# Patient Record
Sex: Female | Born: 1935
Health system: Southern US, Community
[De-identification: ages and names within clinical notes are randomized; demographics above are authoritative.]

## PROBLEM LIST (undated history)

## (undated) DIAGNOSIS — H8390 Unspecified disease of inner ear, unspecified ear: Secondary | ICD-10-CM

## (undated) DIAGNOSIS — H409 Unspecified glaucoma: Secondary | ICD-10-CM

## (undated) DIAGNOSIS — C439 Malignant melanoma of skin, unspecified: Secondary | ICD-10-CM

## (undated) DIAGNOSIS — R319 Hematuria, unspecified: Secondary | ICD-10-CM

---

## 1998-02-26 ENCOUNTER — Emergency Department (HOSPITAL_COMMUNITY): Admission: EM | Admit: 1998-02-26 | Discharge: 1998-02-26 | Payer: Self-pay | Admitting: Emergency Medicine

## 2000-07-04 ENCOUNTER — Ambulatory Visit (HOSPITAL_COMMUNITY): Admission: RE | Admit: 2000-07-04 | Discharge: 2000-07-04 | Payer: Self-pay | Admitting: Gastroenterology

## 2008-10-23 ENCOUNTER — Encounter: Admission: RE | Admit: 2008-10-23 | Discharge: 2008-10-23 | Payer: Self-pay | Admitting: Family Medicine

## 2011-02-11 NOTE — Procedures (Signed)
[facility]  Patient:    Daisy Williams, Daisy Williams                      MRN: [id number] Proc. Date: 07/04/00 Adm. Date:  [id number] Attending:  Orland Mustard CC:         Hadassah Pais. Jeannetta Nap, M.D.   Procedure Report  PROCEDURE:  Colonoscopy.  MEDICATIONS:  Fentanyl 75 mcg, Versed 8 mg IV.  SCOPE:  Pediatric video colonoscope.  INDICATIONS:  Strong family history of colon cancer.  Sister had colon cancer. Mother had metastatic cancer, primary unknown.  DESCRIPTION OF PROCEDURE:  The procedure had been explained to the patient and consent obtained.  With the patient in the left lateral decubitus, the adult Olympus pediatric video colonoscope was inserted and advanced under direct visualization.  There was extensive diverticular disease in the sigmoid colon, and it took some time to pass.  After we passed, we were able to advance to the hepatic flexure.  With the patient on the right side, we were able to advance down into the cecum.  The terminal ileum was entered for a short distance and was normal.  The scope was withdrawn, and the cecum, ascending colon, hepatic flexure, transverse colon, splenic flexure, descending, and sigmoid colon were seen well.  Marked diverticular disease in the sigmoid colon.   No polyps were seen throughout the colon.  The rectum was seen well and was free of polyps.  The scope was withdrawn.  The patient tolerated the procedure well and was maintained on low-flow oxygen and pulse oximeter throughout the procedure with no obvious problem.  ASSESSMENT: 1. No evidence of colon polyps. 2. Marked diverticular disease.  PLAN: 1. Will recommend repeating in five years due to family history. 2. Will give a handout about diverticulosis, fiber, etc. DD:  07/04/00 TD:  07/05/00 Job: [id number]

## 2011-06-29 ENCOUNTER — Emergency Department (HOSPITAL_COMMUNITY): Payer: Medicare Other

## 2011-06-29 ENCOUNTER — Emergency Department (HOSPITAL_COMMUNITY)
Admission: EM | Admit: 2011-06-29 | Discharge: 2011-06-29 | Disposition: A | Discharge status: Home / Self Care | Payer: Medicare Other | Attending: Emergency Medicine | Admitting: Emergency Medicine

## 2011-06-29 DIAGNOSIS — S0990XA Unspecified injury of head, initial encounter: Secondary | ICD-10-CM | POA: Insufficient documentation

## 2011-06-29 DIAGNOSIS — R079 Chest pain, unspecified: Secondary | ICD-10-CM | POA: Insufficient documentation

## 2011-06-29 DIAGNOSIS — R42 Dizziness and giddiness: Secondary | ICD-10-CM | POA: Insufficient documentation

## 2011-06-29 DIAGNOSIS — R110 Nausea: Secondary | ICD-10-CM | POA: Insufficient documentation

## 2011-06-29 DIAGNOSIS — W19XXXA Unspecified fall, initial encounter: Secondary | ICD-10-CM | POA: Insufficient documentation

## 2011-06-29 LAB — DIFFERENTIAL
Basophils Absolute: 0 10*3/uL (ref 0.0–0.1)
Eosinophils Absolute: 0.1 10*3/uL (ref 0.0–0.7)
Eosinophils Relative: 1 % (ref 0–5)
Neutrophils Relative %: 71 % (ref 43–77)

## 2011-06-29 LAB — POCT I-STAT TROPONIN I: Troponin i, poc: 0 ng/mL (ref 0.00–0.08)

## 2011-06-29 LAB — COMPREHENSIVE METABOLIC PANEL
ALT: 8 U/L (ref 0–35)
AST: 13 U/L (ref 0–37)
Albumin: 3.5 g/dL (ref 3.5–5.2)
Alkaline Phosphatase: 40 U/L (ref 39–117)
Chloride: 100 mEq/L (ref 96–112)
Creatinine, Ser: 0.71 mg/dL (ref 0.50–1.10)
Potassium: 3.6 mEq/L (ref 3.5–5.1)
Sodium: 137 mEq/L (ref 135–145)
Total Bilirubin: 0.3 mg/dL (ref 0.3–1.2)

## 2011-06-29 LAB — CBC
Platelets: 242 10*3/uL (ref 150–400)
RDW: 14 % (ref 11.5–15.5)
WBC: 6.3 10*3/uL (ref 4.0–10.5)

## 2012-01-30 DIAGNOSIS — H353190 Nonexudative age-related macular degeneration, unspecified eye, stage unspecified: Secondary | ICD-10-CM | POA: Diagnosis not present

## 2012-01-30 DIAGNOSIS — H25019 Cortical age-related cataract, unspecified eye: Secondary | ICD-10-CM | POA: Diagnosis not present

## 2012-01-30 DIAGNOSIS — H4011X0 Primary open-angle glaucoma, stage unspecified: Secondary | ICD-10-CM | POA: Diagnosis not present

## 2012-01-30 DIAGNOSIS — H409 Unspecified glaucoma: Secondary | ICD-10-CM | POA: Diagnosis not present

## 2012-04-25 DIAGNOSIS — H409 Unspecified glaucoma: Secondary | ICD-10-CM | POA: Diagnosis not present

## 2012-04-25 DIAGNOSIS — H4011X0 Primary open-angle glaucoma, stage unspecified: Secondary | ICD-10-CM | POA: Diagnosis not present

## 2012-06-26 DIAGNOSIS — Z23 Encounter for immunization: Secondary | ICD-10-CM | POA: Diagnosis not present

## 2012-09-25 DIAGNOSIS — H409 Unspecified glaucoma: Secondary | ICD-10-CM | POA: Diagnosis not present

## 2012-09-25 DIAGNOSIS — H4011X0 Primary open-angle glaucoma, stage unspecified: Secondary | ICD-10-CM | POA: Diagnosis not present

## 2012-11-28 DIAGNOSIS — H409 Unspecified glaucoma: Secondary | ICD-10-CM | POA: Diagnosis not present

## 2012-11-28 DIAGNOSIS — H4011X0 Primary open-angle glaucoma, stage unspecified: Secondary | ICD-10-CM | POA: Diagnosis not present

## 2013-01-16 DIAGNOSIS — M25579 Pain in unspecified ankle and joints of unspecified foot: Secondary | ICD-10-CM | POA: Diagnosis not present

## 2013-01-16 DIAGNOSIS — L989 Disorder of the skin and subcutaneous tissue, unspecified: Secondary | ICD-10-CM | POA: Diagnosis not present

## 2013-01-16 DIAGNOSIS — Z85828 Personal history of other malignant neoplasm of skin: Secondary | ICD-10-CM | POA: Diagnosis not present

## 2013-01-16 DIAGNOSIS — D485 Neoplasm of uncertain behavior of skin: Secondary | ICD-10-CM | POA: Diagnosis not present

## 2013-01-16 DIAGNOSIS — E785 Hyperlipidemia, unspecified: Secondary | ICD-10-CM | POA: Diagnosis not present

## 2013-01-16 DIAGNOSIS — Z79899 Other long term (current) drug therapy: Secondary | ICD-10-CM | POA: Diagnosis not present

## 2013-01-16 DIAGNOSIS — L570 Actinic keratosis: Secondary | ICD-10-CM | POA: Diagnosis not present

## 2013-02-08 DIAGNOSIS — L570 Actinic keratosis: Secondary | ICD-10-CM | POA: Diagnosis not present

## 2013-06-06 DIAGNOSIS — H4011X0 Primary open-angle glaucoma, stage unspecified: Secondary | ICD-10-CM | POA: Diagnosis not present

## 2013-06-06 DIAGNOSIS — H409 Unspecified glaucoma: Secondary | ICD-10-CM | POA: Diagnosis not present

## 2013-06-26 DIAGNOSIS — H409 Unspecified glaucoma: Secondary | ICD-10-CM | POA: Diagnosis not present

## 2013-06-26 DIAGNOSIS — H4011X0 Primary open-angle glaucoma, stage unspecified: Secondary | ICD-10-CM | POA: Diagnosis not present

## 2013-06-27 DIAGNOSIS — Z23 Encounter for immunization: Secondary | ICD-10-CM | POA: Diagnosis not present

## 2013-08-13 DIAGNOSIS — H409 Unspecified glaucoma: Secondary | ICD-10-CM | POA: Diagnosis not present

## 2013-08-13 DIAGNOSIS — H4011X0 Primary open-angle glaucoma, stage unspecified: Secondary | ICD-10-CM | POA: Diagnosis not present

## 2013-11-18 DIAGNOSIS — H4011X0 Primary open-angle glaucoma, stage unspecified: Secondary | ICD-10-CM | POA: Diagnosis not present

## 2013-11-18 DIAGNOSIS — H2510 Age-related nuclear cataract, unspecified eye: Secondary | ICD-10-CM | POA: Diagnosis not present

## 2013-11-18 DIAGNOSIS — H409 Unspecified glaucoma: Secondary | ICD-10-CM | POA: Diagnosis not present

## 2014-01-13 DIAGNOSIS — H409 Unspecified glaucoma: Secondary | ICD-10-CM | POA: Diagnosis not present

## 2014-01-13 DIAGNOSIS — H4011X0 Primary open-angle glaucoma, stage unspecified: Secondary | ICD-10-CM | POA: Diagnosis not present

## 2014-01-13 DIAGNOSIS — H25019 Cortical age-related cataract, unspecified eye: Secondary | ICD-10-CM | POA: Diagnosis not present

## 2014-04-14 DIAGNOSIS — H4011X0 Primary open-angle glaucoma, stage unspecified: Secondary | ICD-10-CM | POA: Diagnosis not present

## 2014-04-14 DIAGNOSIS — H25019 Cortical age-related cataract, unspecified eye: Secondary | ICD-10-CM | POA: Diagnosis not present

## 2014-04-14 DIAGNOSIS — H409 Unspecified glaucoma: Secondary | ICD-10-CM | POA: Diagnosis not present

## 2014-06-25 DIAGNOSIS — Z23 Encounter for immunization: Secondary | ICD-10-CM | POA: Diagnosis not present

## 2014-07-28 DIAGNOSIS — H4011X2 Primary open-angle glaucoma, moderate stage: Secondary | ICD-10-CM | POA: Diagnosis not present

## 2014-07-28 DIAGNOSIS — H25013 Cortical age-related cataract, bilateral: Secondary | ICD-10-CM | POA: Diagnosis not present

## 2014-10-06 DIAGNOSIS — H25013 Cortical age-related cataract, bilateral: Secondary | ICD-10-CM | POA: Diagnosis not present

## 2014-10-06 DIAGNOSIS — H4011X2 Primary open-angle glaucoma, moderate stage: Secondary | ICD-10-CM | POA: Diagnosis not present

## 2015-02-06 ENCOUNTER — Emergency Department (HOSPITAL_COMMUNITY): Payer: Medicare Other

## 2015-02-06 ENCOUNTER — Inpatient Hospital Stay (HOSPITAL_COMMUNITY): Payer: Medicare Other

## 2015-02-06 ENCOUNTER — Encounter (HOSPITAL_COMMUNITY): Payer: Self-pay | Admitting: Physical Medicine and Rehabilitation

## 2015-02-06 ENCOUNTER — Inpatient Hospital Stay (HOSPITAL_COMMUNITY)
Admission: EM | Admit: 2015-02-06 | Discharge: 2015-02-08 | DRG: 310 | Disposition: A | Discharge status: Home / Self Care | Payer: Medicare Other | Attending: Internal Medicine | Admitting: Internal Medicine

## 2015-02-06 DIAGNOSIS — J439 Emphysema, unspecified: Secondary | ICD-10-CM | POA: Diagnosis not present

## 2015-02-06 DIAGNOSIS — H409 Unspecified glaucoma: Secondary | ICD-10-CM | POA: Diagnosis present

## 2015-02-06 DIAGNOSIS — I358 Other nonrheumatic aortic valve disorders: Secondary | ICD-10-CM | POA: Diagnosis present

## 2015-02-06 DIAGNOSIS — R1110 Vomiting, unspecified: Secondary | ICD-10-CM

## 2015-02-06 DIAGNOSIS — R42 Dizziness and giddiness: Secondary | ICD-10-CM | POA: Diagnosis not present

## 2015-02-06 DIAGNOSIS — R9431 Abnormal electrocardiogram [ECG] [EKG]: Secondary | ICD-10-CM | POA: Diagnosis present

## 2015-02-06 DIAGNOSIS — R030 Elevated blood-pressure reading, without diagnosis of hypertension: Secondary | ICD-10-CM

## 2015-02-06 DIAGNOSIS — I10 Essential (primary) hypertension: Secondary | ICD-10-CM | POA: Diagnosis present

## 2015-02-06 DIAGNOSIS — IMO0001 Reserved for inherently not codable concepts without codable children: Secondary | ICD-10-CM

## 2015-02-06 DIAGNOSIS — R112 Nausea with vomiting, unspecified: Secondary | ICD-10-CM

## 2015-02-06 DIAGNOSIS — E860 Dehydration: Secondary | ICD-10-CM | POA: Diagnosis present

## 2015-02-06 DIAGNOSIS — I4510 Unspecified right bundle-branch block: Secondary | ICD-10-CM | POA: Diagnosis present

## 2015-02-06 DIAGNOSIS — I4891 Unspecified atrial fibrillation: Secondary | ICD-10-CM | POA: Diagnosis present

## 2015-02-06 DIAGNOSIS — E1165 Type 2 diabetes mellitus with hyperglycemia: Secondary | ICD-10-CM | POA: Diagnosis not present

## 2015-02-06 DIAGNOSIS — R7309 Other abnormal glucose: Secondary | ICD-10-CM | POA: Diagnosis not present

## 2015-02-06 DIAGNOSIS — R55 Syncope and collapse: Secondary | ICD-10-CM | POA: Diagnosis not present

## 2015-02-06 DIAGNOSIS — D7589 Other specified diseases of blood and blood-forming organs: Secondary | ICD-10-CM | POA: Diagnosis present

## 2015-02-06 DIAGNOSIS — R739 Hyperglycemia, unspecified: Secondary | ICD-10-CM | POA: Diagnosis present

## 2015-02-06 DIAGNOSIS — R312 Other microscopic hematuria: Secondary | ICD-10-CM | POA: Diagnosis present

## 2015-02-06 DIAGNOSIS — R3129 Other microscopic hematuria: Secondary | ICD-10-CM

## 2015-02-06 HISTORY — DX: Hematuria, unspecified: R31.9

## 2015-02-06 HISTORY — DX: Unspecified disease of inner ear, unspecified ear: H83.90

## 2015-02-06 HISTORY — DX: Malignant melanoma of skin, unspecified: C43.9

## 2015-02-06 HISTORY — DX: Unspecified glaucoma: H40.9

## 2015-02-06 LAB — IRON AND TIBC
Iron: 63 ug/dL (ref 28–170)
SATURATION RATIOS: 22 % (ref 10.4–31.8)
TIBC: 283 ug/dL (ref 250–450)
UIBC: 220 ug/dL

## 2015-02-06 LAB — URINALYSIS, ROUTINE W REFLEX MICROSCOPIC
Bilirubin Urine: NEGATIVE
GLUCOSE, UA: NEGATIVE mg/dL
KETONES UR: NEGATIVE mg/dL
LEUKOCYTES UA: NEGATIVE
Nitrite: NEGATIVE
PH: 7.5 (ref 5.0–8.0)
Protein, ur: NEGATIVE mg/dL
Specific Gravity, Urine: 1.012 (ref 1.005–1.030)
Urobilinogen, UA: 0.2 mg/dL (ref 0.0–1.0)

## 2015-02-06 LAB — I-STAT CHEM 8, ED
BUN: 12 mg/dL (ref 6–20)
CALCIUM ION: 1.22 mmol/L (ref 1.13–1.30)
CREATININE: 0.7 mg/dL (ref 0.44–1.00)
Chloride: 105 mmol/L (ref 101–111)
GLUCOSE: 167 mg/dL — AB (ref 65–99)
HEMATOCRIT: 44 % (ref 36.0–46.0)
Hemoglobin: 15 g/dL (ref 12.0–15.0)
Potassium: 5.1 mmol/L (ref 3.5–5.1)
SODIUM: 141 mmol/L (ref 135–145)
TCO2: 22 mmol/L (ref 0–100)

## 2015-02-06 LAB — RAPID URINE DRUG SCREEN, HOSP PERFORMED
Amphetamines: NOT DETECTED
BARBITURATES: NOT DETECTED
BENZODIAZEPINES: NOT DETECTED
Cocaine: NOT DETECTED
OPIATES: NOT DETECTED
Tetrahydrocannabinol: NOT DETECTED

## 2015-02-06 LAB — CBC
HCT: 40.5 % (ref 36.0–46.0)
HEMOGLOBIN: 14.1 g/dL (ref 12.0–15.0)
MCH: 36.8 pg — AB (ref 26.0–34.0)
MCHC: 34.8 g/dL (ref 30.0–36.0)
MCV: 105.7 fL — AB (ref 78.0–100.0)
Platelets: 239 10*3/uL (ref 150–400)
RBC: 3.83 MIL/uL — ABNORMAL LOW (ref 3.87–5.11)
RDW: 13.8 % (ref 11.5–15.5)
WBC: 10 10*3/uL (ref 4.0–10.5)

## 2015-02-06 LAB — COMPREHENSIVE METABOLIC PANEL
ALBUMIN: 3.9 g/dL (ref 3.5–5.0)
ALT: 19 U/L (ref 14–54)
AST: 35 U/L (ref 15–41)
Alkaline Phosphatase: 49 U/L (ref 38–126)
Anion gap: 11 (ref 5–15)
BUN: 10 mg/dL (ref 6–20)
CO2: 22 mmol/L (ref 22–32)
Calcium: 9.4 mg/dL (ref 8.9–10.3)
Chloride: 108 mmol/L (ref 101–111)
Creatinine, Ser: 0.73 mg/dL (ref 0.44–1.00)
GFR calc non Af Amer: >60 mL/min (ref >60)
GLUCOSE: 161 mg/dL — AB (ref 65–99)
Potassium: 5 mmol/L (ref 3.5–5.1)
SODIUM: 141 mmol/L (ref 135–145)
TOTAL PROTEIN: 7.1 g/dL (ref 6.5–8.1)
Total Bilirubin: 1.4 mg/dL — ABNORMAL HIGH (ref 0.3–1.2)

## 2015-02-06 LAB — DIFFERENTIAL
BASOS ABS: 0 10*3/uL (ref 0.0–0.1)
Basophils Relative: 0 % (ref 0–1)
Eosinophils Absolute: 0 10*3/uL (ref 0.0–0.7)
Eosinophils Relative: 0 % (ref 0–5)
LYMPHS PCT: 10 % — AB (ref 12–46)
Lymphs Abs: 1 10*3/uL (ref 0.7–4.0)
Monocytes Absolute: 0.4 10*3/uL (ref 0.1–1.0)
Monocytes Relative: 4 % (ref 3–12)
NEUTROS ABS: 8.5 10*3/uL — AB (ref 1.7–7.7)
NEUTROS PCT: 86 % — AB (ref 43–77)

## 2015-02-06 LAB — URINE MICROSCOPIC-ADD ON

## 2015-02-06 LAB — PROTIME-INR
INR: 1.05 (ref 0.00–1.49)
PROTHROMBIN TIME: 13.8 s (ref 11.6–15.2)

## 2015-02-06 LAB — I-STAT TROPONIN, ED: Troponin i, poc: 0 ng/mL (ref 0.00–0.08)

## 2015-02-06 LAB — FERRITIN: Ferritin: 68 ng/mL (ref 11–307)

## 2015-02-06 LAB — TSH: TSH: 2.804 u[IU]/mL (ref 0.350–4.500)

## 2015-02-06 LAB — RETICULOCYTES
RBC.: 3.73 MIL/uL — ABNORMAL LOW (ref 3.87–5.11)
Retic Count, Absolute: 41 10*3/uL (ref 19.0–186.0)
Retic Ct Pct: 1.1 % (ref 0.4–3.1)

## 2015-02-06 LAB — APTT: APTT: 28 s (ref 24–37)

## 2015-02-06 LAB — VITAMIN B12: Vitamin B-12: 2257 pg/mL — ABNORMAL HIGH (ref 180–914)

## 2015-02-06 LAB — TROPONIN I: Troponin I: <0.03 ng/mL (ref <0.031)

## 2015-02-06 LAB — ETHANOL

## 2015-02-06 LAB — FOLATE: Folate: 28 ng/mL (ref >5.9)

## 2015-02-06 MED ORDER — SODIUM CHLORIDE 0.9 % IV BOLUS (SEPSIS)
1000.0000 mL | INTRAVENOUS | Status: AC
  Administered 2015-02-06: 1000 mL via INTRAVENOUS

## 2015-02-06 MED ORDER — ASPIRIN 325 MG PO TABS
325.0000 mg | ORAL_TABLET | Freq: Every day | ORAL | Status: DC
  Administered 2015-02-06 – 2015-02-07 (×2): 325 mg via ORAL
  Filled 2015-02-06 (×2): qty 1

## 2015-02-06 MED ORDER — DIAZEPAM 2 MG PO TABS: 2.0000 mg | ORAL_TABLET | Freq: Four times a day (QID) | ORAL | Status: DC | PRN

## 2015-02-06 MED ORDER — LATANOPROST 0.005 % OP SOLN
1.0000 [drp] | Freq: Every day | OPHTHALMIC | Status: DC
  Administered 2015-02-06 – 2015-02-07 (×2): 1 [drp] via OPHTHALMIC
  Filled 2015-02-06: qty 2.5

## 2015-02-06 MED ORDER — ONDANSETRON HCL 4 MG/2ML IJ SOLN
4.0000 mg | Freq: Once | INTRAMUSCULAR | Status: AC
  Administered 2015-02-06: 4 mg via INTRAVENOUS
  Filled 2015-02-06: qty 2

## 2015-02-06 MED ORDER — APIXABAN 5 MG PO TABS
5.0000 mg | ORAL_TABLET | Freq: Two times a day (BID) | ORAL | Status: DC
  Administered 2015-02-06 – 2015-02-08 (×4): 5 mg via ORAL
  Filled 2015-02-06 (×4): qty 1

## 2015-02-06 MED ORDER — LORAZEPAM 2 MG/ML IJ SOLN
0.5000 mg | Freq: Once | INTRAMUSCULAR | Status: AC
  Administered 2015-02-06: 0.5 mg via INTRAVENOUS
  Filled 2015-02-06: qty 1

## 2015-02-06 MED ORDER — DORZOLAMIDE HCL-TIMOLOL MAL 2-0.5 % OP SOLN
1.0000 [drp] | Freq: Two times a day (BID) | OPHTHALMIC | Status: DC
  Administered 2015-02-06 – 2015-02-08 (×4): 1 [drp] via OPHTHALMIC
  Filled 2015-02-06: qty 10

## 2015-02-06 MED ORDER — SODIUM CHLORIDE 0.9 % IV SOLN
INTRAVENOUS | Status: DC
  Administered 2015-02-06 (×2): via INTRAVENOUS

## 2015-02-06 MED ORDER — STROKE: EARLY STAGES OF RECOVERY BOOK
Freq: Once | Status: AC
  Administered 2015-02-06: 20:00:00

## 2015-02-06 MED ORDER — SODIUM CHLORIDE 0.9 % IV BOLUS (SEPSIS): 250.0000 mL | Freq: Once | INTRAVENOUS | Status: DC

## 2015-02-06 MED ORDER — MECLIZINE HCL 12.5 MG PO TABS
12.5000 mg | ORAL_TABLET | Freq: Three times a day (TID) | ORAL | Status: DC
  Administered 2015-02-06: 12.5 mg via ORAL
  Filled 2015-02-06: qty 1

## 2015-02-06 MED ORDER — ACETAMINOPHEN 325 MG PO TABS: 650.0000 mg | ORAL_TABLET | ORAL | Status: DC | PRN

## 2015-02-06 MED ORDER — HEPARIN SODIUM (PORCINE) 5000 UNIT/ML IJ SOLN: 5000.0000 [IU] | Freq: Three times a day (TID) | INTRAMUSCULAR | Status: DC

## 2015-02-06 NOTE — H&P (Signed)
[facility] History and Physical                                                                                    Jasmyne Lodato, is a 81 y.o. female  MRN: [id number]   DOB - [birth year]  Admit Date - 02/06/2015  Outpatient Primary MD for the patient is No primary care provider on file.  Referring MD: Dr Aline Brochure / ER  With History of -  Glaucoma Vitamin D deficiency B12 deficiency  Resection melanoma under breast (patient in her 5s)  in for   Chief Complaint  Patient presents with  . Dizziness  . Emesis     HPI This is a 81 year old independent female patient with only past medical history of glaucoma who presented to the ER today after abrupt onset of postural dizziness with associated ataxia and nausea and vomiting. Patient reports that she was normal when she went to bed and upon awakening this morning was very dizzy. She attempted to get out of bed but felt so unsteady she was afraid she would fall so she went to the floor and crawled to the bathroom. She was able to get up on the toilet seat at which point she began sweating profusely. She also developed significant nausea and called out to her husband. She later began to have multiple episodes of emesis. She was not having any vertigo symptoms during this time and did not report any focal neurological deficits when questioned. The dizziness has persisted since that time. She also reports she had a headache and was concerned this may be a sign of a heart attack. She did not have any jaw or chest pain. She told my attending physician that she would not allow her husband to take her to the hospital so she basically laid on the floor of her home for at least 4 hours. Prior to leaving she did ask her husband to bring her glass of water because she was thirsty but then felt so nauseous she was unable to drink the water. In discussing past medical history she denies any significant medical problems other than those stated above.  She denies any history of irregular heartbeats or awareness of palpitations during above episode. She is normally very active and states typically she her husband go out to eat at [facility] and then go to the [place] and walk around.  Upon arrival to the ER patient was afebrile, heart rate was 94 and irregular and she was noted to be in atrial fibrillation on bedside telemetry, BP was 179/91, she was maintaining room air saturations of 97%. Because of her postural symptoms she was given a liter of IV fluid in the ER and is also being given Zofran and Ativan to help in her symptoms. Laboratory data was unremarkable except for borderline elevated potassium at 5.0. Glucose slightly elevated at 161 and total bilirubin slightly elevated at 1.4. Initial troponin 0.00. Hemoglobin 14.1 with baseline unknown. She was macrocyte ptotic with an MCV of 105.7. Her white count was normal but she did have elevated neutrophil count of 86%. Her legs were normal. Alcohol level was less than 5. CT of the head  showed no acute changes. EKG revealed atrial fibrillation with controlled ventricular response, PVCs, intraventricular conduction delay, and a prolonged QTC of 541 ms.   Review of Systems   In addition to the HPI above,  No Fever-chills, myalgias or other constitutional symptoms No changes with Vision or hearing, new weakness, tingling, numbness in any extremity, No problems swallowing food or Liquids, indigestion/reflux No Chest pain, Cough or Shortness of Breath, palpitations, orthopnea or DOE No Abdominal pain, no melena or hematochezia, no dark tarry stools, Bowel movements are regular, No dysuria, hematuria or flank pain No new skin rashes, lesions, masses or bruises, No new joints pains-aches No recent weight gain or loss No polyuria, polydypsia or polyphagia,  *A full 10 point Review of Systems was done, except as stated above, all other Review of Systems were negative.  Social History History  Substance  Use Topics  . Smoking status: Never Smoker   . Smokeless tobacco: Not on file  . Alcohol Use: No    Resides at: Private residence  Lives with: Husband  Ambulatory status: Was ambulatory without assistive devices prior to current illness   Family History Family History  Problem Relation Age of Onset  . Colon cancer MI/? Sudden cardiac death   Father, sister, and brother all in their 17s and all nonsmokers      Prior to Admission medications   Not on File    No Known Allergies  Physical Exam  Vitals  Blood pressure 142/68, pulse 81, temperature 97.8 F (36.6 C), temperature source Oral, resp. rate 18, SpO2 98 %.   General: Minimal distress as evidenced by persistent dizziness with nausea especially when seated upright on stretcher, appears healthy and well nourished  Psych:  Normal affect, Denies Suicidal or Homicidal ideations, Awake Alert, Oriented X 3. Speech and thought patterns are clear and appropriate, no apparent short term memory deficits  Neuro:   No focal neurological deficits, CN II through XII intact, Strength 5/5 all 4 extremities, Sensation intact all 4 extremities. No nystagmus appreciated-dizziness and nausea reproducible with sitting patient upright in the bed.  ENT:  Ears and Eyes appear Normal, Conjunctivae clear, PER. Moist oral mucosa without erythema or exudates.  Neck:  Supple, No lymphadenopathy appreciated  Respiratory:  Symmetrical chest wall movement, Good air movement bilaterally, CTAB. Room Air  Cardiac:  Irregular with atrial fibrillation noted on bedside telemetry, No Murmurs, no LE edema noted, no JVD, No carotid bruits, peripheral pulses palpable at 2+  Abdomen:  Positive bowel sounds, Soft, Non tender, Non distended,  No masses appreciated, no obvious hepatosplenomegaly  Skin:  No Cyanosis, Normal Skin Turgor, No Skin Rash or Bruise.  Extremities: Symmetrical without obvious trauma or injury,  no effusions.  Data  Review  CBC  Recent Labs Lab 02/06/15 1155 02/06/15 1231  WBC 10.0  --   HGB 14.1 15.0  HCT 40.5 44.0  PLT 239  --   MCV 105.7*  --   MCH 36.8*  --   MCHC 34.8  --   RDW 13.8  --   LYMPHSABS 1.0  --   MONOABS 0.4  --   EOSABS 0.0  --   BASOSABS 0.0  --     Chemistries   Recent Labs Lab 02/06/15 1155 02/06/15 1231  NA 141 141  K 5.0 5.1  CL 108 105  CO2 22  --   GLUCOSE 161* 167*  BUN 10 12  CREATININE 0.73 0.70  CALCIUM 9.4  --   AST 35  --  ALT 19  --   ALKPHOS 49  --   BILITOT 1.4*  --     CrCl cannot be calculated (Unknown ideal weight.).  No results for input(s): TSH, T4TOTAL, T3FREE, THYROIDAB in the last 72 hours.  Invalid input(s): FREET3  Coagulation profile  Recent Labs Lab 02/06/15 1258  INR 1.05    No results for input(s): DDIMER in the last 72 hours.  Cardiac Enzymes No results for input(s): CKMB, TROPONINI, MYOGLOBIN in the last 168 hours.  Invalid input(s): CK  Invalid input(s): POCBNP  Urinalysis No results found for: COLORURINE, APPEARANCEUR, LABSPEC, PHURINE, GLUCOSEU, HGBUR, BILIRUBINUR, KETONESUR, PROTEINUR, UROBILINOGEN, NITRITE, LEUKOCYTESUR  Imaging results:   Ct Head Wo Contrast  02/06/2015   CLINICAL DATA:  Extreme dizziness and nausea. Vomiting since 6:30 a.m. today. Syncopal episode today.  EXAM: CT HEAD WITHOUT CONTRAST  TECHNIQUE: Contiguous axial images were obtained from the base of the skull through the vertex without intravenous contrast.  COMPARISON:  CT head without contrast 06/29/2011  FINDINGS: No acute cortical infarct, hemorrhage, or mass lesion is present. The ventricles are of normal size. No significant extra-axial fluid collection is evident. The paranasal sinuses and mastoid air cells are clear. The calvarium is intact.  IMPRESSION: Negative CT of the head.   Electronically Signed   By: San Morelle M.D.   On: 02/06/2015 13:30     EKG: (Independently reviewed) atrial fibrillation,  intraventricular conduction delay, PVCs,? Right bundle branch block, no acute ischemic changes appreciated, prolonged QTC 541 ms   Assessment & Plan  Principal Problem:   Postural dizziness -Admit to neuro telemetry -Given new finding of atrial fibrillation concerned that symptoms may be related to atypical presentation of TIA or other embolic cerebral event therefore will check MRI/MRA of the brain -No carotid bruits and no focal neurological symptoms so no indication to check carotid duplex at this juncture -No vertigo symptoms but will begin Antivert and when necessary Valium for symptom management -PT and OT evaluation; also PT vestibular evaluation -Check orthostatic vital signs -Begin aspirin -Check fasting lipid panel  Active Problems:   Atrial fibrillation with controlled ventricular response -No prior diagnosis -Cardiology has been consulted/Dr Hilty to see patient -CHADVASc = 3 so likely will need to be on anticoagulation-defer to cardiology -Etiology includes cardiac structural issues versus related to advanced age versus possible ischemic etiology -Cycle cardiac enzymes -Patient reports that her father sister and brother all died suddenly in their 59s-they were nonsmokers and cause of death was presumed to be myocardial infarction -Check 2-D echocardiogram    Prolonged QT interval -Patient not on medications prior to admission that would contribute to prolongation of QTC -This may also explain daily members sudden death issues -Defer additional workup to cardiology -Avoid QTC prolonging medications such as antipsychotics or floroquinolones    Dehydration -Seems acute and related to repeated vomiting prior to arrival -Has been given 1 L of IV fluids in the ER -Continue IV fluids at 75 mL per hour for now -Follow up labs    Acute hyperglycemia -Uncertain if related to acute stress i.e. excessive cortisol Vernell Barrier versus underlying diabetes -Check hemoglobin A1c     Macrocytosis -Patient is not anemic -Is on oral B12 at home -Check anemia panel and TSH    DVT Prophylaxis: Subcutaneous heparin  Family Communication: Husband at bedside  Code Status:  Full code  Condition:  Stable  Discharge disposition:  Time spent in minutes : 60      ELLIS,ALLISON L. ANP on  02/06/2015 at 3:33 PM  Between 7am to 7pm - Pager - [phone]  After 7pm go to [email or web address] - password [email or web address]  And look for the night coverage person covering me after hours  [facility]

## 2015-02-06 NOTE — ED Notes (Signed)
Report attempted 4N

## 2015-02-06 NOTE — ED Notes (Signed)
Pt presents to department for evaluation of dizziness and nausea/vomiting. Onset this morning. Reports near syncopal episode at home. Denies pain upon arrival to ED. Pt is alert and oriented x4. Actively vomiting upon arrival to exam room.

## 2015-02-06 NOTE — Progress Notes (Signed)
No acute stroke or bleed noted on head MRI or CT. I'm recommending starting anticoagulation tonight, based on an elevated CHADSVASC score of at least 3. Recommend Eliquis 5 mg BID - weight >60 kg, normal renal function, age <72.   Pixie Casino, MD, [facility] Attending Cardiologist [place]

## 2015-02-06 NOTE — Consult Note (Signed)
Cardiology Consultation Note  Patient ID: Daisy Williams, MRN: [id number], DOB/AGE: [birth year] 81 y.o. Admit date: 02/06/2015   Date of Consult: 02/06/2015 Primary Physician: No primary care provider on file. Primary Cardiologist: New to Dr. Debara Pickett  Chief Complaint: nausea, vomiting, lightheadedness Reason for Consultation: atrial fibrillation, newly recognized of unclear duration  HPI: Daisy Williams is an 81 y/o F with history of glaucoma, remote inner ear dysfunction, remote melanoma removal, and hematuria who presented to [facility] today with nausea, vomiting, dizziness and weakness. She does not regularly follow with medical care and states she only goes to the doctor when something is wrong. She has remained active and walks at [facility] regularly without any functional limitation including no chest pain or dyspnea. She likes the sports section because there's less people around. She has been feeling well lately except until today. She woke up this morning feeling quite dizzy. It did not feel like the room was spinning. She felt so weak that she had to crawl to the bathroom because she was afraid she would fall. She got to the toilet and sat down but when she went to get up she had severe nausea and vomiting. She felt drenched in sweat. She also reported headache. She denied any jaw pain, chest pain, dyspnea, or awareness of palpitations. No sick contacts, family members with similar symptoms, fevers, chills, LEE, orthopnea, dysuria, visual changes, recent bleeding or melena. She does not have a history of falls. She wasn't even able to keep water down today. She came to the ER because of persistent symptoms and was actively vomiting on arrival. She was given 0.5mg  IV ativan and 4mg  of Zofran. She was also given 1L IV fluid bolus which she feels helped the most. Her husband notes she is more "like herself" since getting the IV fluids. She currently denies any discomfort or dyspnea but does report  continued mild dizziness. She feels more nauseated when she sits up.   In the ER she was incidentally noted to be in atrial fibrillation but generally rate controlled - 80s-low 100s. Unknown chronicity. Last EKG NSR in 2012. Blood pressure [id number] systolic. Pulse ox normal. Labs notable for glucose 167, total bili 1.4, troponin neg x 1, WBC normal but elevated neutrophils, macrocytosis 105.7 but Hgb 14.1. UA with hyaline casts and moderate Hgb - patient reports history of low grade hematuria in the past treated with cranberry juice. CT head negative.  Past Medical History  Diagnosis Date  . Glaucoma   . Inner ear dysfunction       Most Recent Cardiac Studies: None   Surgical History: History reviewed. No pertinent past surgical history.   Home Meds: Prior to Admission medications   Medication Sig Start Date End Date Taking? Authorizing Provider  CALCIUM-VITAMIN D PO Take 2 tablets by mouth daily.   Yes Historical Provider, MD  Cyanocobalamin (VITAMIN B 12 PO) Take 1 tablet by mouth daily.   Yes Historical Provider, MD  dorzolamide-timolol (COSOPT) 22.3-6.8 MG/ML ophthalmic solution Place 1 drop into both eyes 2 (two) times daily. 07/28/14 07/28/15 Yes Historical Provider, MD  latanoprost (XALATAN) 0.005 % ophthalmic solution Place 1 drop into both eyes at bedtime. 01/01/15  Yes Historical Provider, MD    Inpatient Medications:    . sodium chloride      Allergies: No Known Allergies  History   Social History  . Marital Status: Married    Spouse Name: N/A  . Number of Children: N/A  . Years of Education:  N/A   Occupational History  . Not on file.   Social History Main Topics  . Smoking status: Never Smoker   . Smokeless tobacco: Not on file  . Alcohol Use: No  . Drug Use: No  . Sexual Activity: Not on file   Other Topics Concern  . Not on file   Social History Narrative  . No narrative on file     Family History  Problem Relation Age of Onset  . Colon cancer     . Heart disease      Father passed away at 53. Was taking heart medicine - "didn't last a month after they put him on it."  . Heart disease      Brother was on heart medicine. He went off his heart medicine and died shortly after - age 69.  . Other      Sister was a nursing home patient and passed away at 32 of unknown cause.     Review of Systems: General: negative for weight changes Cardiovascular: negative for chest pain, edema, orthopnea, palpitations, paroxysmal nocturnal dyspnea, shortness of breath or dyspnea on exertion Dermatological: negative for rash Respiratory: negative for cough or wheezing Abdominal: negative for diarrhea, bright red blood per rectum, melena, or hematemesis Neurologic: negative for visual changes, syncope All other systems reviewed and are otherwise negative except as noted above.  Labs:  Lab Results  Component Value Date   WBC 10.0 02/06/2015   HGB 15.0 02/06/2015   HCT 44.0 02/06/2015   MCV 105.7* 02/06/2015   PLT 239 02/06/2015    Recent Labs Lab 02/06/15 1155 02/06/15 1231  NA 141 141  K 5.0 5.1  CL 108 105  CO2 22  --   BUN 10 12  CREATININE 0.73 0.70  CALCIUM 9.4  --   PROT 7.1  --   BILITOT 1.4*  --   ALKPHOS 49  --   ALT 19  --   AST 35  --   GLUCOSE 161* 167*   Radiology/Studies:  Ct Head Wo Contrast  02/06/2015   CLINICAL DATA:  Extreme dizziness and nausea. Vomiting since 6:30 a.m. today. Syncopal episode today.  EXAM: CT HEAD WITHOUT CONTRAST  TECHNIQUE: Contiguous axial images were obtained from the base of the skull through the vertex without intravenous contrast.  COMPARISON:  CT head without contrast 06/29/2011  FINDINGS: No acute cortical infarct, hemorrhage, or mass lesion is present. The ventricles are of normal size. No significant extra-axial fluid collection is evident. The paranasal sinuses and mastoid air cells are clear. The calvarium is intact.  IMPRESSION: Negative CT of the head.   Electronically Signed   By:  San Morelle M.D.   On: 02/06/2015 13:30   Wt Readings from Last 3 Encounters:  No data found for Wt   EKG:  1) newly recognized atrial fib 99bpm, atypical RBBB, minimal ST depression III, avF, V3-V5 2) newly recognized atrial fib 103bpm, atypical RBBB, nonspecific ST-T changes   Physical Exam: Blood pressure 142/68, pulse 81, temperature 97.8 F (36.6 C), temperature source Oral, resp. rate 18, SpO2 98 %. General: Well developed, well nourished, in no acute distress. Head: Normocephalic, atraumatic, sclera non-icteric, no xanthomas, nares are without discharge.  Neck: Negative for carotid bruits. JVD not elevated. Lungs: Clear bilaterally to auscultation without wheezes, rales, or rhonchi. Breathing is unlabored. Heart: Irregularly irregular with S1 S2. No murmurs, rubs, or gallops appreciated. Abdomen: Soft, non-tender, non-distended with normoactive bowel sounds. No hepatomegaly. No rebound/guarding. No  obvious abdominal masses. Negative Murphy's sign. Msk:  Strength and tone appear normal for age. Extremities: No clubbing or cyanosis. No edema.  Distal pedal pulses are 2+ and equal bilaterally. Neuro: Alert and oriented X 3. No facial asymmetry. No focal deficit. Moves all extremities spontaneously. Psych:  Responds to questions appropriately with a normal affect.    Assessment and Plan:   1. Nausea, vomiting and dizziness - Agree with MRI to exclude posterior circulation CVA or cerebellar insult. She does note increased dizziness upon sitting so this could be a postural inner ear issue as well. Her symptoms improved with IV fluids. Bili is up slightly. Further evaluation per IM. It does not seem like we can blame this all on atrial fib as her rate is controlled suggesting possibly longer chronicity, and she is currently rate controlled with stable - if not elevated - blood pressures. As she is not describing any typical anginal symptoms, will hold off troponins and follow-up  echocardiogram.  2. Newly recognized atrial fib of uncertain duration- given that she is not in RVR upon arrival, this rhythm may have been going on longer than today. She does not regularly seek medical care unless she feels bad. CHADSVASC is tentatively definitely 3 for age and female, however, depending on what BP/A1C result it may be 5 for HTN and DM as well. Anticipate placing her on anticoagulation once more is known about her neurologic situation - MRI pending. She will need further evaluation of her microscopic hematuria although this sounds chronic. Check echocardiogram and thyroid function.   3. Elevated blood pressure/probable HTN - following for now. May be related to acute nausea/vomiting but until more is known about the etiology, would not aggressively treat.  4. Hyperglycemia - may represent newly diagnosed DM. A1C pending.   5. Macrocytosis - IM plans to pursue anemia panel to exclude B12/folate deficiencies.  6. Microscopic hematuria - per IM.  Daisy Ache PA-C 02/06/2015, 3:31 PM Pager: [phone]

## 2015-02-06 NOTE — ED Provider Notes (Signed)
CSN: [id number]     Arrival date & time 02/06/15  1139 History   First MD Initiated Contact with Patient 02/06/15 1145     Chief Complaint  Patient presents with  . Dizziness  . Emesis     (Consider location/radiation/quality/duration/timing/severity/associated sxs/prior Treatment) HPI  Daisy Williams [age] year old female who presents emergency Department with chief complaint of dizziness, nausea and vomiting. Patient states that she awoke this morning feeling very dizzy. She is unsure if she had vertiginous symptoms. She states that she had to crawl to the bathroom because she was afraid she would fall and could not walk steadily. The patient complains of severe nausea and vomiting, nonbloody, nonbilious vomitus. She denies chest pain, shortness of breath. The patient was previously admitted in 2012 for without prodrome. She denies any other medical problems except for glaucoma. Denies unilateral weakness, facial asymmetry, difficulty with speech. Denies fevers, chills, myalgias, arthralgias. Denies DOE, SOB, chest tightness or pressure, radiation to left arm, jaw or back, or diaphoresis. Denies dysuria, flank pain, suprapubic pain, frequency, urgency, or hematuria. Denies abdominal pain,diarrhea or constipation.   History reviewed. No pertinent past medical history. History reviewed. No pertinent past surgical history. No family history on file. History  Substance Use Topics  . Smoking status: Never Smoker   . Smokeless tobacco: Not on file  . Alcohol Use: No   OB History    No data available     Review of Systems  Ten systems reviewed and are negative for acute change, except as noted in the HPI.    Allergies  Review of patient's allergies indicates no known allergies.  Home Medications   Prior to Admission medications   Not on File   BP 179/91 mmHg  Pulse 94  Temp(Src) 97.8 F (36.6 C) (Oral)  Resp 17  SpO2 97% Physical Exam  Constitutional: She is oriented to  person, place, and time. She appears well-developed and well-nourished. No distress.  HENT:  Head: Normocephalic and atraumatic.  Eyes: Conjunctivae are normal. No scleral icterus.  Neck: Normal range of motion.  Cardiovascular: Normal rate, regular rhythm and normal heart sounds.  Exam reveals no gallop and no friction rub.   No murmur heard. Pulmonary/Chest: Effort normal and breath sounds normal. No respiratory distress.  Abdominal: Soft. Bowel sounds are normal. She exhibits no distension and no mass. There is no tenderness. There is no guarding.  Neurological: She is alert and oriented to person, place, and time.  Speech is clear and goal oriented, follows commands Major Cranial nerves without deficit, no facial droop Normal strength in upper and lower extremities bilaterally including dorsiflexion and plantar flexion, strong and equal grip strength Sensation normal to light and sharp touch Moves extremities without ataxia, coordination intact Normal finger to nose and rapid alternating movements Neg romberg, no pronator drift Normal gait Normal heel-shin and balance   Skin: Skin is warm and dry. She is not diaphoretic.  Nursing note and vitals reviewed.   ED Course  Procedures (including critical care time) Labs Review Labs Reviewed  CBC - Abnormal; Notable for the following:    RBC 3.83 (*)    MCV 105.7 (*)    MCH 36.8 (*)    All other components within normal limits  DIFFERENTIAL - Abnormal; Notable for the following:    Neutrophils Relative % 86 (*)    Neutro Abs 8.5 (*)    Lymphocytes Relative 10 (*)    All other components within normal limits  I-STAT CHEM 8, ED -  Abnormal; Notable for the following:    Glucose, Bld 167 (*)    All other components within normal limits  ETHANOL  COMPREHENSIVE METABOLIC PANEL  URINE RAPID DRUG SCREEN (HOSP PERFORMED)  URINALYSIS, ROUTINE W REFLEX MICROSCOPIC  PROTIME-INR  APTT  I-STAT TROPOININ, ED    Imaging Review No  results found.   EKG Interpretation   Date/Time:  Friday Feb 06 2015 12:48:50 EDT Ventricular Rate:  103 PR Interval:    QRS Duration: 127 QT Interval:  413 QTC Calculation: 541 R Axis:   67 Text Interpretation:  Atrial fibrillation Ventricular premature complex  IVCD, consider atypical RBBB Confirmed by HARRISON  MD, FORREST ([id number]) on  02/06/2015 12:54:59 PM      MDM   Final diagnoses:  Atrial fibrillation, unspecified  Dizziness  Hyperglycemia  Non-intractable vomiting with nausea, vomiting of unspecified type    1:10 PM BP 179/91 mmHg  Pulse 94  Temp(Src) 97.8 F (36.6 C) (Oral)  Resp 17  SpO56 54%  81 year old female with no significant past medical history except for glaucoma presents emergency Department with chief complaint of dizziness, nausea and vomiting. She was normal before she went to bed last night. I do not appreciate any focal neurologic deficit on my examination. The patient's EKG does show atrial fibrillation which is rate controlled. This appears to be new. Her glucose is elevated at 167. Negative troponin  Patient with elevated blood glucose. This is the second documented elevated blood glucose since 08/27/2011, suggesting untreated diabetes. She is in atrial fibrillation for unknown amount of time since onset. The patient will be admitted for further workup. Radiology will consult. She is improved after treatment with Ativan and Zofran. She appears stable for admission.  I personally reviewed the imaging tests through PACS system. I have reviewed and interpreted Lab values. I reviewed available ER/hospitalization records through the EMR      Margarita Mail, PA-C 02/06/15 [place], MD 02/06/15 1540

## 2015-02-07 ENCOUNTER — Inpatient Hospital Stay (HOSPITAL_COMMUNITY): Payer: Medicare Other

## 2015-02-07 DIAGNOSIS — I4891 Unspecified atrial fibrillation: Principal | ICD-10-CM

## 2015-02-07 DIAGNOSIS — R42 Dizziness and giddiness: Secondary | ICD-10-CM

## 2015-02-07 DIAGNOSIS — R7309 Other abnormal glucose: Secondary | ICD-10-CM

## 2015-02-07 LAB — GLUCOSE, CAPILLARY
GLUCOSE-CAPILLARY: 107 mg/dL — AB (ref 65–99)
Glucose-Capillary: 95 mg/dL (ref 65–99)

## 2015-02-07 LAB — CBC
HCT: 33.7 % — ABNORMAL LOW (ref 36.0–46.0)
HEMOGLOBIN: 11.4 g/dL — AB (ref 12.0–15.0)
MCH: 36 pg — ABNORMAL HIGH (ref 26.0–34.0)
MCHC: 33.8 g/dL (ref 30.0–36.0)
MCV: 106.3 fL — ABNORMAL HIGH (ref 78.0–100.0)
PLATELETS: 206 10*3/uL (ref 150–400)
RBC: 3.17 MIL/uL — ABNORMAL LOW (ref 3.87–5.11)
RDW: 13.7 % (ref 11.5–15.5)
WBC: 6.6 10*3/uL (ref 4.0–10.5)

## 2015-02-07 LAB — HEMOGLOBIN A1C
Hgb A1c MFr Bld: 5.9 % — ABNORMAL HIGH (ref 4.8–5.6)
Mean Plasma Glucose: 123 mg/dL

## 2015-02-07 LAB — BASIC METABOLIC PANEL
Anion gap: 6 (ref 5–15)
BUN: 7 mg/dL (ref 6–20)
CALCIUM: 8.4 mg/dL — AB (ref 8.9–10.3)
CO2: 25 mmol/L (ref 22–32)
Chloride: 102 mmol/L (ref 101–111)
Creatinine, Ser: 0.78 mg/dL (ref 0.44–1.00)
GFR calc non Af Amer: >60 mL/min (ref >60)
Glucose, Bld: 98 mg/dL (ref 65–99)
Potassium: 3.3 mmol/L — ABNORMAL LOW (ref 3.5–5.1)
Sodium: 133 mmol/L — ABNORMAL LOW (ref 135–145)

## 2015-02-07 LAB — LIPID PANEL
CHOLESTEROL: 165 mg/dL (ref 0–200)
HDL: 33 mg/dL — AB (ref >40)
LDL Cholesterol: 116 mg/dL — ABNORMAL HIGH (ref 0–99)
TRIGLYCERIDES: 81 mg/dL (ref <150)
Total CHOL/HDL Ratio: 5 RATIO
VLDL: 16 mg/dL (ref 0–40)

## 2015-02-07 LAB — URINE CULTURE
COLONY COUNT: NO GROWTH
Culture: NO GROWTH

## 2015-02-07 LAB — TROPONIN I: Troponin I: <0.03 ng/mL (ref <0.031)

## 2015-02-07 MED ORDER — MECLIZINE HCL 12.5 MG PO TABS: 12.5000 mg | ORAL_TABLET | Freq: Three times a day (TID) | ORAL | Status: DC | PRN

## 2015-02-07 MED ORDER — PANTOPRAZOLE SODIUM 40 MG IV SOLR
40.0000 mg | Freq: Two times a day (BID) | INTRAVENOUS | Status: DC
  Administered 2015-02-07: 40 mg via INTRAVENOUS
  Filled 2015-02-07: qty 40

## 2015-02-07 MED ORDER — ONDANSETRON HCL 4 MG/2ML IJ SOLN: 4.0000 mg | Freq: Four times a day (QID) | INTRAMUSCULAR | Status: DC | PRN

## 2015-02-07 MED ORDER — POTASSIUM CHLORIDE CRYS ER 20 MEQ PO TBCR
20.0000 meq | EXTENDED_RELEASE_TABLET | Freq: Once | ORAL | Status: AC
  Administered 2015-02-07: 20 meq via ORAL
  Filled 2015-02-07: qty 1

## 2015-02-07 NOTE — Progress Notes (Signed)
[facility] PROGRESS NOTE  Daisy Williams KAJ:[id number] DOB: 05-Oct-1927 DOA: 02/06/2015 PCP: No primary care provider on file.  Assessment/Plan: dizziness -MRI/MRA of the brain negative for stroke.  -Continue with IV fluids.  -PT and OT evaluation; also PT vestibular evaluation -LDL 116 -meclizine PRN.   Nausea, vomiting: could be related to gastroenteritis vs vertigo. Improved. Zofran prn.  Start Protonix.    Atrial fibrillation with controlled ventricular response -No prior diagnosis -Cardiology following.  Started on Eliquis.  -CHADVASc = 3 --cardiac enzymes negative -2-D echocardiogram; normal Ef   Prolonged QT interval -Patient not on medications prior to admission that would contribute to prolongation of QTC -EKG; QT decrease to 462.  -Avoid QTC prolonging medications such as antipsychotics or floroquinolones   Dehydration -Seems acute and related to repeated vomiting prior to arrival -Continue IV fluids at 75 mL -Follow up labs  -b-met pending.    Acute hyperglycemia -hemoglobin A1-c 5.9 -diabetes educator for diet.    Macrocytosis -Patient is not anemic -Is on oral B12 at home -B 12 elevated. TSH 2.8  Code Status: Full code/  Family Communication: Care discussed with patient Disposition Plan: Remain inpatient. Home in 24 to 48 hours.    Consultants:  Cardiology  Procedures:  ECHO: Left ventricle: The cavity size was normal. Wall thickness was increased in a pattern of mild LVH. The estimated ejection fraction was 60%. Wall motion was normal; there were no regional wall motion abnormalities. - Aortic valve: Sclerosis without stenosis. There was mild regurgitation. - Mitral valve: Mildly thickened leaflets . There was mild regurgitation. - Left atrium: The atrium was moderately dilated. - Right atrium: The atrium was moderately dilated. - Pulmonary arteries: PA peak pressure: 37 mm Hg  (S).  Antibiotics:  none  HPI/Subjective: She is feeling better today. Less nauseous. Tolerating clear diet.  Denies diarrhea or abdominal pain,.   Objective: Filed Vitals:   02/07/15 0600  BP: 99/50  Pulse: 86  Temp: 98.5 F (36.9 C)  Resp: 16    Intake/Output Summary (Last 24 hours) at 02/07/15 0959 Last data filed at 02/06/15 1450  Gross per 24 hour  Intake   1000 ml  Output      0 ml  Net   1000 ml   Filed Weights   02/06/15 1546  Weight: 58.968 kg (130 lb)    Exam:   General:  Alert in no distress.   Cardiovascular: S 1, S 2 RRR  Respiratory: CTA  Abdomen: BS present soft, nt  Musculoskeletal: no edema   Data Reviewed: Basic Metabolic Panel:  Recent Labs Lab 02/06/15 1155 02/06/15 1231  NA 141 141  K 5.0 5.1  CL 108 105  CO2 22  --   GLUCOSE 161* 167*  BUN 10 12  CREATININE 0.73 0.70  CALCIUM 9.4  --    Liver Function Tests:  Recent Labs Lab 02/06/15 1155  AST 35  ALT 19  ALKPHOS 49  BILITOT 1.4*  PROT 7.1  ALBUMIN 3.9   No results for input(s): LIPASE, AMYLASE in the last 168 hours. No results for input(s): AMMONIA in the last 168 hours. CBC:  Recent Labs Lab 02/06/15 1155 02/06/15 1231  WBC 10.0  --   NEUTROABS 8.5*  --   HGB 14.1 15.0  HCT 40.5 44.0  MCV 105.7*  --   PLT 239  --    Cardiac Enzymes:  Recent Labs Lab 02/06/15 1935 02/06/15 2303 02/07/15 0454  TROPONINI <0.03 <0.03 <0.03   BNP (last  3 results) No results for input(s): BNP in the last 8760 hours.  ProBNP (last 3 results) No results for input(s): PROBNP in the last 8760 hours.  CBG: No results for input(s): GLUCAP in the last 168 hours.  No results found for this or any previous visit (from the past 240 hour(s)).   Studies: Dg Chest 2 View  02/06/2015   CLINICAL DATA:  Nausea and weakness.  Atrial fibrillation.  EXAM: CHEST - 2 VIEW  COMPARISON:  Two-view chest x-ray 06/29/2011  FINDINGS: The heart size and mediastinal contours are within  normal limits. Both lungs are clear. The visualized soft tissues are unremarkable. Degenerative changes within the thoracic spine are stable.  IMPRESSION: 1. No acute cardiopulmonary disease. 2. Emphysema.   Electronically Signed   By: San Morelle M.D.   On: 02/06/2015 16:31   Ct Head Wo Contrast  02/06/2015   CLINICAL DATA:  Extreme dizziness and nausea. Vomiting since 6:30 a.m. today. Syncopal episode today.  EXAM: CT HEAD WITHOUT CONTRAST  TECHNIQUE: Contiguous axial images were obtained from the base of the skull through the vertex without intravenous contrast.  COMPARISON:  CT head without contrast 06/29/2011  FINDINGS: No acute cortical infarct, hemorrhage, or mass lesion is present. The ventricles are of normal size. No significant extra-axial fluid collection is evident. The paranasal sinuses and mastoid air cells are clear. The calvarium is intact.  IMPRESSION: Negative CT of the head.   Electronically Signed   By: San Morelle M.D.   On: 02/06/2015 13:30   Mr Jodene Nam Head Wo Contrast  02/06/2015   CLINICAL DATA:  Dizziness with nausea and vomiting. Onset earlier today. Postural component. Near syncopal episode.  EXAM: MRI HEAD WITHOUT CONTRAST  MRA HEAD WITHOUT CONTRAST  TECHNIQUE: Multiplanar, multiecho pulse sequences of the brain and surrounding structures were obtained without intravenous contrast. Angiographic images of the head were obtained using MRA technique without contrast.  COMPARISON:  CT head earlier today.  FINDINGS: MRI HEAD FINDINGS  No evidence for acute infarction, hemorrhage, mass lesion, hydrocephalus, or extra-axial fluid. Moderate cerebral and cerebellar atrophy, not unexpected for age. Mild to moderate Flow voids are maintained throughout the carotid, basilar, and vertebral arteries. There are no areas of chronic hemorrhage. Pituitary, pineal, and cerebellar tonsils unremarkable. No upper cervical lesions. Visualized calvarium, skull base, and upper cervical  osseous structures unremarkable. Scalp and extracranial soft tissues, orbits, sinuses, and mastoids show no acute process. subcortical and periventricular T2 and FLAIR hyperintensities, likely chronic microvascular ischemic change.  MRA HEAD FINDINGS  The internal carotid arteries are widely patent. Basilar artery is diminutive due to fetal PCA origins bilaterally. LEFT vertebral is the dominant contributor to the basilar with RIGHT vertebral primarily supplying the inferior cerebellum. No intracranial stenosis or aneurysm.  IMPRESSION: Unremarkable MR brain.  No acute or focal intracranial abnormality.  Chronic changes related to aging as described.  No intracranial flow reducing lesion, or vascular occlusion.   Electronically Signed   By: Rolla Flatten M.D.   On: 02/06/2015 17:20   Mr Brain Wo Contrast  02/06/2015   CLINICAL DATA:  Dizziness with nausea and vomiting. Onset earlier today. Postural component. Near syncopal episode.  EXAM: MRI HEAD WITHOUT CONTRAST  MRA HEAD WITHOUT CONTRAST  TECHNIQUE: Multiplanar, multiecho pulse sequences of the brain and surrounding structures were obtained without intravenous contrast. Angiographic images of the head were obtained using MRA technique without contrast.  COMPARISON:  CT head earlier today.  FINDINGS: MRI HEAD FINDINGS  No evidence  for acute infarction, hemorrhage, mass lesion, hydrocephalus, or extra-axial fluid. Moderate cerebral and cerebellar atrophy, not unexpected for age. Mild to moderate Flow voids are maintained throughout the carotid, basilar, and vertebral arteries. There are no areas of chronic hemorrhage. Pituitary, pineal, and cerebellar tonsils unremarkable. No upper cervical lesions. Visualized calvarium, skull base, and upper cervical osseous structures unremarkable. Scalp and extracranial soft tissues, orbits, sinuses, and mastoids show no acute process. subcortical and periventricular T2 and FLAIR hyperintensities, likely chronic microvascular  ischemic change.  MRA HEAD FINDINGS  The internal carotid arteries are widely patent. Basilar artery is diminutive due to fetal PCA origins bilaterally. LEFT vertebral is the dominant contributor to the basilar with RIGHT vertebral primarily supplying the inferior cerebellum. No intracranial stenosis or aneurysm.  IMPRESSION: Unremarkable MR brain.  No acute or focal intracranial abnormality.  Chronic changes related to aging as described.  No intracranial flow reducing lesion, or vascular occlusion.   Electronically Signed   By: Rolla Flatten M.D.   On: 02/06/2015 17:20    Scheduled Meds: . apixaban  5 mg Oral BID  . aspirin  325 mg Oral Daily  . dorzolamide-timolol  1 drop Both Eyes BID  . latanoprost  1 drop Both Eyes QHS  . meclizine  12.5 mg Oral TID   Continuous Infusions: . sodium chloride 75 mL/hr at 02/06/15 1837    Principal Problem:   Postural dizziness Active Problems:   Atrial fibrillation with controlled ventricular response   Dehydration   Macrocytosis   Acute hyperglycemia   Prolonged QT interval   Glaucoma   Elevated blood pressure   Vomiting   Dizziness   Microscopic hematuria    Time spent: 35 minutes.     Niel Hummer A  [facility] Pager [phone]. If 7PM-7AM, please contact night-coverage at [email or web address], password [facility] 02/07/2015, 9:59 AM  LOS: 1 day

## 2015-02-07 NOTE — Evaluation (Signed)
Physical Therapy Evaluation Patient Details Name: Daisy Williams MRN: [id number] DOB: 08-07-1928 Today's Date: 02/07/2015   History of Present Illness  This is a 81 year old independent female patient with only past medical history of glaucoma who presented to the ER today after abrupt onset of postural dizziness with associated ataxia and nausea and vomiting. MRI/MRA negative. Pt with h/o a-fib and was found to be in a-fib in ED.  Clinical Impression  PT asked to screen for vestibular dysfunction. Pt negative for all tests and measures and do not suspect BPPV. See General comment section of note. Pt functioning near baseline with mild balance impairment as noted by 16 on DGI. Pt with good home set up and family support for safe d/c home when medically cleared. Acute PT to follow to progress mobility as able.    Follow Up Recommendations No PT follow up;Supervision - Intermittent    Equipment Recommendations  None recommended by PT    Recommendations for Other Services       Precautions / Restrictions Precautions Precautions: Fall Restrictions Weight Bearing Restrictions: No      Mobility  Bed Mobility Overal bed mobility: Modified Independent             General bed mobility comments: pt denied dizziness with rolling and transition sup to sit, sit to supine.  Transfers Overall transfer level: Needs assistance Equipment used: Rolling walker (2 wheeled) Transfers: Sit to/from Stand Sit to Stand: Supervision         General transfer comment: pt with safe technique, supervision due to h/o dizzienss however pt with no dizziness  Ambulation/Gait Ambulation/Gait assistance: Min guard Ambulation Distance (Feet): 500 Feet Assistive device: None Gait Pattern/deviations: Step-through pattern;Narrow base of support Gait velocity: wfl   General Gait Details: pt initially unsteady but became more steady as we progressed. no episodes of LOB  Stairs Stairs: Yes Stairs  assistance: Min guard Stair Management: One rail Left;Alternating pattern Number of Stairs: 3 General stair comments: pt with safe, good technique  Wheelchair Mobility    Modified Rankin (Stroke Patients Only)       Balance                                 Standardized Balance Assessment Standardized Balance Assessment : Dynamic Gait Index   Dynamic Gait Index Level Surface: Mild Impairment Change in Gait Speed: Mild Impairment Gait with Horizontal Head Turns: Mild Impairment Gait with Vertical Head Turns: Mild Impairment Gait and Pivot Turn: Mild Impairment Step Over Obstacle: Mild Impairment Step Around Obstacles: Mild Impairment Steps: Mild Impairment Total Score: 16       Pertinent Vitals/Pain Pain Assessment: No/denies pain    Home Living Family/patient expects to be discharged to:: Private residence Living Arrangements: Spouse/significant other Available Help at Discharge: Family;Available 24 hours/day Type of Home: House Home Access: Stairs to enter Entrance Stairs-Rails: Left Entrance Stairs-Number of Steps: 3 Home Layout: One level Home Equipment: None      Prior Function Level of Independence: Independent               Hand Dominance   Dominant Hand: Right    Extremity/Trunk Assessment   Upper Extremity Assessment: Overall WFL for tasks assessed           Lower Extremity Assessment: Overall WFL for tasks assessed      Cervical / Trunk Assessment: Normal  Communication   Communication: HOH  Cognition Arousal/Alertness: Awake/alert Behavior  During Therapy: WFL for tasks assessed/performed Overall Cognitive Status: Within Functional Limits for tasks assessed                      General Comments General comments (skin integrity, edema, etc.): VESTIBULAR TESTING: pt with no c/o of dizziness and negative for nystagmus with both the horizontal roll test and dix hallpike to L and R. Pt reports when she felt dizzy  PTA it was more lightheadedness not the room spinning. Pt able to track finger and perform smooth pursuit and saccadic mvmts normally without difficulty.    Exercises        Assessment/Plan    PT Assessment Patient needs continued PT services  PT Diagnosis Difficulty walking   PT Problem List Decreased activity tolerance;Decreased balance;Decreased mobility  PT Treatment Interventions Gait training;Functional mobility training;Therapeutic activities;Therapeutic exercise;Balance training   PT Goals (Current goals can be found in the Care Plan section) Acute Rehab PT Goals Patient Stated Goal: home asap PT Goal Formulation: With patient Time For Goal Achievement: 02/14/15 Potential to Achieve Goals: Good Additional Goals Additional Goal #1: Pt to score >19 on DGI to indicate minimal falls risk.    Frequency Min 3X/week   Barriers to discharge        Co-evaluation               End of Session Equipment Utilized During Treatment: Gait belt Activity Tolerance: Patient tolerated treatment well Patient left: in chair;with call bell/phone within reach Nurse Communication: Mobility status         Time: 6213-0865 PT Time Calculation (min) (ACUTE ONLY): 27 min   Charges:   PT Evaluation $Initial PT Evaluation Tier I: 1 Procedure PT Treatments $Gait Training: 8-22 mins   PT G CodesKingsley Callander 02/07/2015, 1:11 PM  Kittie Plater, PT, DPT Pager #: [phone] Office #: [phone]

## 2015-02-07 NOTE — Discharge Instructions (Addendum)
Information on my medicine - ELIQUIS (apixaban)  This medication education was reviewed with me or my healthcare representative as part of my discharge preparation.  The pharmacist that spoke with me during my hospital stay was:  Lavenia Atlas, [facility]  Why was Eliquis prescribed for you? Eliquis was prescribed for you to reduce the risk of a blood clot forming that can cause a stroke if you have a medical condition called atrial fibrillation (a type of irregular heartbeat).  What do You need to know about Eliquis ? Take your Eliquis TWICE DAILY - one tablet in the morning and one tablet in the evening with or without food. If you have difficulty swallowing the tablet whole please discuss with your pharmacist how to take the medication safely.  Take Eliquis exactly as prescribed by your doctor and DO NOT stop taking Eliquis without talking to the doctor who prescribed the medication.  Stopping may increase your risk of developing a stroke.  Refill your prescription before you run out.  After discharge, you should have regular check-up appointments with your healthcare provider that is prescribing your Eliquis.  In the future your dose may need to be changed if your kidney function or weight changes by a significant amount or as you get older.  What do you do if you miss a dose? If you miss a dose, take it as soon as you remember on the same day and resume taking twice daily.  Do not take more than one dose of ELIQUIS at the same time to make up a missed dose.  Important Safety Information A possible side effect of Eliquis is bleeding. You should call your healthcare provider right away if you experience any of the following: ? Bleeding from an injury or your nose that does not stop. ? Unusual colored urine (red or dark brown) or unusual colored stools (red or black). ? Unusual bruising for unknown reasons. ? A serious fall or if you hit your head (even if there is no  bleeding).  Some medicines may interact with Eliquis and might increase your risk of bleeding or clotting while on Eliquis. To help avoid this, consult your healthcare provider or pharmacist prior to using any new prescription or non-prescription medications, including herbals, vitamins, non-steroidal anti-inflammatory drugs (NSAIDs) and supplements.  This website has more information on Eliquis (apixaban): http://www.eliquis.com/eliquis/homeApixaban oral tablets What is this medicine? APIXABAN (a PIX a ban) is an anticoagulant (blood thinner). It is used to lower the chance of stroke in people with a medical condition called atrial fibrillation. It is also used to treat or prevent blood clots in the lungs or in the veins. This medicine may be used for other purposes; ask your health care provider or pharmacist if you have questions. COMMON BRAND NAME(S): Eliquis What should I tell my health care provider before I take this medicine? They need to know if you have any of these conditions: -bleeding disorders -bleeding in the brain -blood in your stools (black or tarry stools) or if you have blood in your vomit -history of stomach bleeding -kidney disease -liver disease -mechanical heart valve -an unusual or allergic reaction to apixaban, other medicines, foods, dyes, or preservatives -pregnant or trying to get pregnant -breast-feeding How should I use this medicine? Take this medicine by mouth with a glass of water. Follow the directions on the prescription label. You can take it with or without food. If it upsets your stomach, take it with food. Take your medicine at regular  intervals. Do not take it more often than directed. Do not stop taking except on your doctor's advice. Stopping this medicine may increase your risk of a blot clot. Be sure to refill your prescription before you run out of medicine. Talk to your pediatrician regarding the use of this medicine in children. Special care  may be needed. Overdosage: If you think you have taken too much of this medicine contact a poison control center or emergency room at once. NOTE: This medicine is only for you. Do not share this medicine with others. What if I miss a dose? If you miss a dose, take it as soon as you can. If it is almost time for your next dose, take only that dose. Do not take double or extra doses. What may interact with this medicine? This medicine may interact with the following: -aspirin and aspirin-like medicines -certain medicines for fungal infections like ketoconazole and itraconazole -certain medicines for seizures like carbamazepine and phenytoin -certain medicines that treat or prevent blood clots like warfarin, enoxaparin, and dalteparin -clarithromycin -NSAIDs, medicines for pain and inflammation, like ibuprofen or naproxen -rifampin -ritonavir -St. John's wort This list may not describe all possible interactions. Give your health care provider a list of all the medicines, herbs, non-prescription drugs, or dietary supplements you use. Also tell them if you smoke, drink alcohol, or use illegal drugs. Some items may interact with your medicine. What should I watch for while using this medicine? Notify your doctor or health care professional and seek emergency treatment if you develop breathing problems; changes in vision; chest pain; severe, sudden headache; pain, swelling, warmth in the leg; trouble speaking; sudden numbness or weakness of the face, arm, or leg. These can be signs that your condition has gotten worse. If you are going to have surgery, tell your doctor or health care professional that you are taking this medicine. Tell your health care professional that you use this medicine before you have a spinal or epidural procedure. Sometimes people who take this medicine have bleeding problems around the spine when they have a spinal or epidural procedure. This bleeding is very rare. If you have  a spinal or epidural procedure while on this medicine, call your health care professional immediately if you have back pain, numbness or tingling (especially in your legs and feet), muscle weakness, paralysis, or loss of bladder or bowel control. Avoid sports and activities that might cause injury while you are using this medicine. Severe falls or injuries can cause unseen bleeding. Be careful when using sharp tools or knives. Consider using an Copy. Take special care brushing or flossing your teeth. Report any injuries, bruising, or red spots on the skin to your doctor or health care professional. What side effects may I notice from receiving this medicine? Side effects that you should report to your doctor or health care professional as soon as possible: -allergic reactions like skin rash, itching or hives, swelling of the face, lips, or tongue -signs and symptoms of bleeding such as bloody or black, tarry stools; red or dark-brown urine; spitting up blood or brown material that looks like coffee grounds; red spots on the skin; unusual bruising or bleeding from the eye, gums, or nose This list may not describe all possible side effects. Call your doctor for medical advice about side effects. You may report side effects to FDA at 1-800-FDA-1088. Where should I keep my medicine? Keep out of the reach of children. Store at room temperature between 20 and  25 degrees C (68 and 77 degrees F). Throw away any unused medicine after the expiration date. NOTE: This sheet is a summary. It may not cover all possible information. If you have questions about this medicine, talk to your doctor, pharmacist, or health care provider.  2015, [employer]vier/Gold Standard. (2013-05-17 11:59:24) Atrial Fibrillation Atrial fibrillation is a condition that causes your heart to beat irregularly. It may also cause your heart to beat faster than normal. Atrial fibrillation can prevent your heart from pumping blood normally.  It increases your risk of stroke and heart problems. HOME CARE  Take medications as told by your doctor.  Only take medications that your doctor says are safe. Some medications can make the condition worse or happen again.  If blood thinners were prescribed by your doctor, take them exactly as told. Too much can cause bleeding. Too little and you will not have the needed protection against stroke and other problems.  Perform blood tests at home if told by your doctor.  Perform blood tests exactly as told by your doctor.  Do not drink alcohol.  Do not drink beverages with caffeine such as coffee, soda, and some teas.  Maintain a healthy weight.  Do not use diet pills unless your doctor says they are safe. They may make heart problems worse.  Follow diet instructions as told by your doctor.  Exercise regularly as told by your doctor.  Keep all follow-up appointments. GET HELP IF:  You notice a change in the speed, rhythm, or strength of your heartbeat.  You suddenly begin peeing (urinating) more often.  You get tired more easily when moving or exercising. GET HELP RIGHT AWAY IF:   You have chest or belly (abdominal) pain.  You feel sick to your stomach (nauseous).  You are short of breath.  You suddenly have swollen feet and ankles.  You feel dizzy.  You face, arms, or legs feel numb or weak.  There is a change in your vision or speech. MAKE SURE YOU:   Understand these instructions.  Will watch your condition.  Will get help right away if you are not doing well or get worse. Document Released: 06/21/2008 Document Revised: 01/27/2014 Document Reviewed: 10/23/2012 [facility] Patient Information 2015 [place], [place]. This information is not intended to replace advice given to you by your health care provider. Make sure you discuss any questions you have with your health care provider.

## 2015-02-07 NOTE — Progress Notes (Signed)
SUBJECTIVE:  Complete cardiology consult note was done yesterday by Dr. Debara Pickett. Apixaban was started for anticoagulation. Patient is stable.   Filed Vitals:   02/07/15 0200 02/07/15 0400 02/07/15 0600 02/07/15 0957  BP: 105/52 102/40 99/50 121/46  Pulse: 83 79 86 80  Temp: 98.2 F (36.8 C) 98.1 F (36.7 C) 98.5 F (36.9 C) 98.5 F (36.9 C)  TempSrc: Oral Oral Oral Oral  Resp: 16 18 16 18   Height:      Weight:      SpO2: 99% 98% 100%      Intake/Output Summary (Last 24 hours) at 02/07/15 1024 Last data filed at 02/06/15 1450  Gross per 24 hour  Intake   1000 ml  Output      0 ml  Net   1000 ml    LABS: Basic Metabolic Panel:  Recent Labs  02/06/15 1155 02/06/15 1231  NA 141 141  K 5.0 5.1  CL 108 105  CO2 22  --   GLUCOSE 161* 167*  BUN 10 12  CREATININE 0.73 0.70  CALCIUM 9.4  --    Liver Function Tests:  Recent Labs  02/06/15 1155  AST 35  ALT 19  ALKPHOS 49  BILITOT 1.4*  PROT 7.1  ALBUMIN 3.9   No results for input(s): LIPASE, AMYLASE in the last 72 hours. CBC:  Recent Labs  02/06/15 1155 02/06/15 1231  WBC 10.0  --   NEUTROABS 8.5*  --   HGB 14.1 15.0  HCT 40.5 44.0  MCV 105.7*  --   PLT 239  --    Cardiac Enzymes:  Recent Labs  02/06/15 1935 02/06/15 2303 02/07/15 0454  TROPONINI <0.03 <0.03 <0.03   BNP: Invalid input(s): POCBNP D-Dimer: No results for input(s): DDIMER in the last 72 hours. Hemoglobin A1C:  Recent Labs  02/06/15 1512  HGBA1C 5.9*   Fasting Lipid Panel:  Recent Labs  02/07/15 0454  CHOL 165  HDL 33*  LDLCALC 116*  TRIG 81  CHOLHDL 5.0   Thyroid Function Tests:  Recent Labs  02/06/15 1512  TSH 2.804    RADIOLOGY: Dg Chest 2 View  02/06/2015   CLINICAL DATA:  Nausea and weakness.  Atrial fibrillation.  EXAM: CHEST - 2 VIEW  COMPARISON:  Two-view chest x-ray 06/29/2011  FINDINGS: The heart size and mediastinal contours are within normal limits. Both lungs are clear. The visualized soft  tissues are unremarkable. Degenerative changes within the thoracic spine are stable.  IMPRESSION: 1. No acute cardiopulmonary disease. 2. Emphysema.   Electronically Signed   By: San Morelle M.D.   On: 02/06/2015 16:31   Ct Head Wo Contrast  02/06/2015   CLINICAL DATA:  Extreme dizziness and nausea. Vomiting since 6:30 a.m. today. Syncopal episode today.  EXAM: CT HEAD WITHOUT CONTRAST  TECHNIQUE: Contiguous axial images were obtained from the base of the skull through the vertex without intravenous contrast.  COMPARISON:  CT head without contrast 06/29/2011  FINDINGS: No acute cortical infarct, hemorrhage, or mass lesion is present. The ventricles are of normal size. No significant extra-axial fluid collection is evident. The paranasal sinuses and mastoid air cells are clear. The calvarium is intact.  IMPRESSION: Negative CT of the head.   Electronically Signed   By: San Morelle M.D.   On: 02/06/2015 13:30   Mr Jodene Nam Head Wo Contrast  02/06/2015   CLINICAL DATA:  Dizziness with nausea and vomiting. Onset earlier today. Postural component. Near syncopal episode.  EXAM: MRI  HEAD WITHOUT CONTRAST  MRA HEAD WITHOUT CONTRAST  TECHNIQUE: Multiplanar, multiecho pulse sequences of the brain and surrounding structures were obtained without intravenous contrast. Angiographic images of the head were obtained using MRA technique without contrast.  COMPARISON:  CT head earlier today.  FINDINGS: MRI HEAD FINDINGS  No evidence for acute infarction, hemorrhage, mass lesion, hydrocephalus, or extra-axial fluid. Moderate cerebral and cerebellar atrophy, not unexpected for age. Mild to moderate Flow voids are maintained throughout the carotid, basilar, and vertebral arteries. There are no areas of chronic hemorrhage. Pituitary, pineal, and cerebellar tonsils unremarkable. No upper cervical lesions. Visualized calvarium, skull base, and upper cervical osseous structures unremarkable. Scalp and extracranial soft  tissues, orbits, sinuses, and mastoids show no acute process. subcortical and periventricular T2 and FLAIR hyperintensities, likely chronic microvascular ischemic change.  MRA HEAD FINDINGS  The internal carotid arteries are widely patent. Basilar artery is diminutive due to fetal PCA origins bilaterally. LEFT vertebral is the dominant contributor to the basilar with RIGHT vertebral primarily supplying the inferior cerebellum. No intracranial stenosis or aneurysm.  IMPRESSION: Unremarkable MR brain.  No acute or focal intracranial abnormality.  Chronic changes related to aging as described.  No intracranial flow reducing lesion, or vascular occlusion.   Electronically Signed   By: Rolla Flatten M.D.   On: 02/06/2015 17:20   Mr Brain Wo Contrast  02/06/2015   CLINICAL DATA:  Dizziness with nausea and vomiting. Onset earlier today. Postural component. Near syncopal episode.  EXAM: MRI HEAD WITHOUT CONTRAST  MRA HEAD WITHOUT CONTRAST  TECHNIQUE: Multiplanar, multiecho pulse sequences of the brain and surrounding structures were obtained without intravenous contrast. Angiographic images of the head were obtained using MRA technique without contrast.  COMPARISON:  CT head earlier today.  FINDINGS: MRI HEAD FINDINGS  No evidence for acute infarction, hemorrhage, mass lesion, hydrocephalus, or extra-axial fluid. Moderate cerebral and cerebellar atrophy, not unexpected for age. Mild to moderate Flow voids are maintained throughout the carotid, basilar, and vertebral arteries. There are no areas of chronic hemorrhage. Pituitary, pineal, and cerebellar tonsils unremarkable. No upper cervical lesions. Visualized calvarium, skull base, and upper cervical osseous structures unremarkable. Scalp and extracranial soft tissues, orbits, sinuses, and mastoids show no acute process. subcortical and periventricular T2 and FLAIR hyperintensities, likely chronic microvascular ischemic change.  MRA HEAD FINDINGS  The internal carotid  arteries are widely patent. Basilar artery is diminutive due to fetal PCA origins bilaterally. LEFT vertebral is the dominant contributor to the basilar with RIGHT vertebral primarily supplying the inferior cerebellum. No intracranial stenosis or aneurysm.  IMPRESSION: Unremarkable MR brain.  No acute or focal intracranial abnormality.  Chronic changes related to aging as described.  No intracranial flow reducing lesion, or vascular occlusion.   Electronically Signed   By: Rolla Flatten M.D.   On: 02/06/2015 17:20    PHYSICAL EXAM  patient is stable. Cardiac exam reveals an S1 and S2. The rhythm is irregularly irregular. The rate is controlled.   TELEMETRY: I have reviewed telemetry today Feb 07, 2015. There is atrial fibrillation. The rate is controlled.   ASSESSMENT AND PLAN:  Principal Problem:     Atrial fibrillation with controlled ventricular response      Atrial fibrillation controlled. Apixaban was started last night.   Dola Argyle 02/07/2015 10:24 AM

## 2015-02-08 LAB — BASIC METABOLIC PANEL
ANION GAP: 6 (ref 5–15)
BUN: <5 mg/dL — ABNORMAL LOW (ref 6–20)
CHLORIDE: 106 mmol/L (ref 101–111)
CO2: 26 mmol/L (ref 22–32)
Calcium: 9.3 mg/dL (ref 8.9–10.3)
Creatinine, Ser: 0.92 mg/dL (ref 0.44–1.00)
GFR calc Af Amer: >60 mL/min (ref >60)
GFR calc non Af Amer: 56 mL/min — ABNORMAL LOW (ref >60)
Glucose, Bld: 102 mg/dL — ABNORMAL HIGH (ref 65–99)
POTASSIUM: 4.7 mmol/L (ref 3.5–5.1)
Sodium: 138 mmol/L (ref 135–145)

## 2015-02-08 LAB — GLUCOSE, CAPILLARY
GLUCOSE-CAPILLARY: 90 mg/dL (ref 65–99)
GLUCOSE-CAPILLARY: 91 mg/dL (ref 65–99)

## 2015-02-08 LAB — CBC
HCT: 36.8 % (ref 36.0–46.0)
Hemoglobin: 12.3 g/dL (ref 12.0–15.0)
MCH: 35.5 pg — AB (ref 26.0–34.0)
MCHC: 33.4 g/dL (ref 30.0–36.0)
MCV: 106.4 fL — AB (ref 78.0–100.0)
Platelets: 222 10*3/uL (ref 150–400)
RBC: 3.46 MIL/uL — ABNORMAL LOW (ref 3.87–5.11)
RDW: 13.8 % (ref 11.5–15.5)
WBC: 6.2 10*3/uL (ref 4.0–10.5)

## 2015-02-08 MED ORDER — PANTOPRAZOLE SODIUM 40 MG PO TBEC: 40.0000 mg | DELAYED_RELEASE_TABLET | Freq: Every day | ORAL | Status: DC

## 2015-02-08 MED ORDER — APIXABAN 5 MG PO TABS: 5.0000 mg | ORAL_TABLET | Freq: Two times a day (BID) | ORAL | Status: DC

## 2015-02-08 MED ORDER — PANTOPRAZOLE SODIUM 40 MG PO TBEC
40.0000 mg | DELAYED_RELEASE_TABLET | Freq: Two times a day (BID) | ORAL | Status: DC
  Administered 2015-02-08: 40 mg via ORAL
  Filled 2015-02-08: qty 1

## 2015-02-08 NOTE — Discharge Summary (Signed)
Physician Discharge Summary  Daisy Williams YIR:[id number] DOB: [birth year] DOA: 02/06/2015  PCP: No primary care provider on file.  Admit date: 02/06/2015 Discharge date: 02/08/2015  Time spent: 35 minutes  Recommendations for Outpatient Follow-up:  Needs to follow up with cardiology for further management of A fib and anticoagulation. Patient was discharge on Eliquis. She might not be able to afford this medication. She received one month supply. Might need to change anticoagulation to coumadin. Discussed with Dr Ron Parker.   Discharge Diagnoses:    Postural dizziness   Atrial fibrillation with controlled ventricular response   Dehydration   Macrocytosis   Acute hyperglycemia   Prolonged QT interval   Glaucoma   Elevated blood pressure   Vomiting   Dizziness   Microscopic hematuria   Discharge Condition: Stable.   Diet recommendation: Heart healthy  Filed Weights   02/06/15 1546  Weight: 58.968 kg (130 lb)    History of present illness:  81 year old female with no chronic medical problems presented with dizziness with associated nausea and vomiting when she woke up on the morning of admission. Because of her unsteadiness, the patient actually crawled to her bathroom. She had diaphoresis and N/V x 4. She layed on the floor x 4 hours because she did not want to come to the hospital. Subsequently, she agreed to allow her husband to bring her to the hospital by private vehicle. Workup in the emergency department revealed atrial fibrillation with controlled ventricular response with a heart rate in the upper 90s and low 100s. CBC revealed WBC 10.0, hemoglobin 14.1, platelets 239,000. Hepatic enzymes were unremarkable except for mildly elevated bilirubin of 1.4. CT brain was negative. EKG showed atrial fibrillation with nonspecific ST changes and right bundle branch block. Cardiology was consulted from the emergency department. CHADS-VASc is at least 3. The patient would benefit from  anticoagulation, but will leave ultimate decision to cardiology. Obtain echocardiogram, TSH, UA and urine culture, hemoglobin A1c. Check orthostatics and place the patient on telemetry. Give the patient judicious IV fluids. Given the patient's continued dizziness in the setting of new-onset atrial fibrillation, obtain MRI of the brain.  Hospital Course:  Dizziness; in setting of A fib, dehydration. Resolved.  -MRI/MRA of the brain negative for stroke.  -Continue with IV fluids.  -PT and OT evaluation; also PT vestibular evaluation -LDL 116 -meclizine PRN.   Nausea, vomiting: could be related to gastroenteritis vs vertigo. Improved. Zofran prn.  Start Protonix.    Atrial fibrillation with controlled ventricular response -No prior diagnosis -Cardiology following.  Started on Eliquis. 30 days free card provide. Unclear if patient will be able to afford medication. Might need to be transition to coumadin . Needs to follow up with cardiology -CHADVASc = 3 --cardiac enzymes negative -2-D echocardiogram; normal Ef   Prolonged QT interval -Patient not on medications prior to admission that would contribute to prolongation of QTC -EKG; QT decrease to 462.  -Avoid QTC prolonging medications such as antipsychotics or floroquinolones   Dehydration -Seems acute and related to repeated vomiting prior to arrival -Continue IV fluids at 75 mL \-resolved.    Acute hyperglycemia -hemoglobin A1-c 5.9 -diabetes educator for diet.    Macrocytosis -Patient is not anemic -Is on oral B12 at home -B 12 elevated. TSH 2.8  Procedures: ECHO; Left ventricle: The cavity size was normal. Wall thickness was increased in a pattern of mild LVH. The estimated ejection fraction was 60%. Wall motion was normal; there were no regional wall motion abnormalities. - Aortic valve:  Sclerosis without stenosis. There was mild regurgitation. - Mitral valve: Mildly thickened leaflets . There was  mild regurgitation. - Left atrium: The atrium was moderately dilated. - Right atrium: The atrium was moderately dilated. - Pulmonary arteries: PA peak pressure: 37 mm Hg (S).  Consultations:  Cardiology  Discharge Exam: Filed Vitals:   02/08/15 0947  BP: 136/53  Pulse: 83  Temp: 98.1 F (36.7 C)  Resp: 18    General: Alert in no distress.  Cardiovascular: S 1, S 2 RRR Respiratory: CTA  Discharge Instructions   Discharge Instructions    Diet - low sodium heart healthy    Complete by:  As directed      Increase activity slowly    Complete by:  As directed           Discharge Medication List as of 02/08/2015  1:57 PM    START taking these medications   Details  apixaban (ELIQUIS) 5 MG TABS tablet Take 1 tablet (5 mg total) by mouth 2 (two) times daily., Starting 02/08/2015, Until Discontinued, Print    pantoprazole (PROTONIX) 40 MG tablet Take 1 tablet (40 mg total) by mouth daily., Starting 02/08/2015, Until Discontinued, Print      CONTINUE these medications which have NOT CHANGED   Details  CALCIUM-VITAMIN D PO Take 2 tablets by mouth daily., Until Discontinued, Historical Med    Cyanocobalamin (VITAMIN B 12 PO) Take 1 tablet by mouth daily., Until Discontinued, Historical Med    dorzolamide-timolol (COSOPT) 22.3-6.8 MG/ML ophthalmic solution Place 1 drop into both eyes 2 (two) times daily., Starting 07/28/2014, Until Tue 07/28/15, Historical Med    latanoprost (XALATAN) 0.005 % ophthalmic solution Place 1 drop into both eyes at bedtime., Starting 01/01/2015, Until Discontinued, Historical Med       No Known Allergies Follow-up Information    Follow up with Pixie Casino, MD In 1 week.   Specialty:  Cardiology   Contact information:   [address] [address] [phone]        The results of significant diagnostics from this hospitalization (including imaging, microbiology, ancillary and laboratory) are listed below for  reference.    Significant Diagnostic Studies: Dg Chest 2 View  02/06/2015   CLINICAL DATA:  Nausea and weakness.  Atrial fibrillation.  EXAM: CHEST - 2 VIEW  COMPARISON:  Two-view chest x-ray 06/29/2011  FINDINGS: The heart size and mediastinal contours are within normal limits. Both lungs are clear. The visualized soft tissues are unremarkable. Degenerative changes within the thoracic spine are stable.  IMPRESSION: 1. No acute cardiopulmonary disease. 2. Emphysema.   Electronically Signed   By: San Morelle M.D.   On: 02/06/2015 16:31   Ct Head Wo Contrast  02/06/2015   CLINICAL DATA:  Extreme dizziness and nausea. Vomiting since 6:30 a.m. today. Syncopal episode today.  EXAM: CT HEAD WITHOUT CONTRAST  TECHNIQUE: Contiguous axial images were obtained from the base of the skull through the vertex without intravenous contrast.  COMPARISON:  CT head without contrast 06/29/2011  FINDINGS: No acute cortical infarct, hemorrhage, or mass lesion is present. The ventricles are of normal size. No significant extra-axial fluid collection is evident. The paranasal sinuses and mastoid air cells are clear. The calvarium is intact.  IMPRESSION: Negative CT of the head.   Electronically Signed   By: San Morelle M.D.   On: 02/06/2015 13:30   Mr Jodene Nam Head Wo Contrast  02/06/2015   CLINICAL DATA:  Dizziness with nausea and  vomiting. Onset earlier today. Postural component. Near syncopal episode.  EXAM: MRI HEAD WITHOUT CONTRAST  MRA HEAD WITHOUT CONTRAST  TECHNIQUE: Multiplanar, multiecho pulse sequences of the brain and surrounding structures were obtained without intravenous contrast. Angiographic images of the head were obtained using MRA technique without contrast.  COMPARISON:  CT head earlier today.  FINDINGS: MRI HEAD FINDINGS  No evidence for acute infarction, hemorrhage, mass lesion, hydrocephalus, or extra-axial fluid. Moderate cerebral and cerebellar atrophy, not unexpected for age. Mild to  moderate Flow voids are maintained throughout the carotid, basilar, and vertebral arteries. There are no areas of chronic hemorrhage. Pituitary, pineal, and cerebellar tonsils unremarkable. No upper cervical lesions. Visualized calvarium, skull base, and upper cervical osseous structures unremarkable. Scalp and extracranial soft tissues, orbits, sinuses, and mastoids show no acute process. subcortical and periventricular T2 and FLAIR hyperintensities, likely chronic microvascular ischemic change.  MRA HEAD FINDINGS  The internal carotid arteries are widely patent. Basilar artery is diminutive due to fetal PCA origins bilaterally. LEFT vertebral is the dominant contributor to the basilar with RIGHT vertebral primarily supplying the inferior cerebellum. No intracranial stenosis or aneurysm.  IMPRESSION: Unremarkable MR brain.  No acute or focal intracranial abnormality.  Chronic changes related to aging as described.  No intracranial flow reducing lesion, or vascular occlusion.   Electronically Signed   By: Rolla Flatten M.D.   On: 02/06/2015 17:20   Mr Brain Wo Contrast  02/06/2015   CLINICAL DATA:  Dizziness with nausea and vomiting. Onset earlier today. Postural component. Near syncopal episode.  EXAM: MRI HEAD WITHOUT CONTRAST  MRA HEAD WITHOUT CONTRAST  TECHNIQUE: Multiplanar, multiecho pulse sequences of the brain and surrounding structures were obtained without intravenous contrast. Angiographic images of the head were obtained using MRA technique without contrast.  COMPARISON:  CT head earlier today.  FINDINGS: MRI HEAD FINDINGS  No evidence for acute infarction, hemorrhage, mass lesion, hydrocephalus, or extra-axial fluid. Moderate cerebral and cerebellar atrophy, not unexpected for age. Mild to moderate Flow voids are maintained throughout the carotid, basilar, and vertebral arteries. There are no areas of chronic hemorrhage. Pituitary, pineal, and cerebellar tonsils unremarkable. No upper cervical  lesions. Visualized calvarium, skull base, and upper cervical osseous structures unremarkable. Scalp and extracranial soft tissues, orbits, sinuses, and mastoids show no acute process. subcortical and periventricular T2 and FLAIR hyperintensities, likely chronic microvascular ischemic change.  MRA HEAD FINDINGS  The internal carotid arteries are widely patent. Basilar artery is diminutive due to fetal PCA origins bilaterally. LEFT vertebral is the dominant contributor to the basilar with RIGHT vertebral primarily supplying the inferior cerebellum. No intracranial stenosis or aneurysm.  IMPRESSION: Unremarkable MR brain.  No acute or focal intracranial abnormality.  Chronic changes related to aging as described.  No intracranial flow reducing lesion, or vascular occlusion.   Electronically Signed   By: Rolla Flatten M.D.   On: 02/06/2015 17:20    Microbiology: Recent Results (from the past 240 hour(s))  Culture, Urine     Status: None   Collection Time: 02/06/15  2:49 PM  Result Value Ref Range Status   Specimen Description URINE, RANDOM  Final   Special Requests NONE  Final   Colony Count NO GROWTH Performed at [facility]   Final   Culture NO GROWTH Performed at [facility]   Final   Report Status 02/07/2015 FINAL  Final     Labs: Basic Metabolic Panel:  Recent Labs Lab 02/06/15 1155 02/06/15 1231 02/07/15 1310 02/08/15 1021  NA 141 141 133* 138  K 5.0 5.1 3.3* 4.7  CL 108 105 102 106  CO2 22  --  25 26  GLUCOSE 161* 167* 98 102*  BUN 10 12 7  <5*  CREATININE 0.73 0.70 0.78 0.92  CALCIUM 9.4  --  8.4* 9.3   Liver Function Tests:  Recent Labs Lab 02/06/15 1155  AST 35  ALT 19  ALKPHOS 49  BILITOT 1.4*  PROT 7.1  ALBUMIN 3.9   No results for input(s): LIPASE, AMYLASE in the last 168 hours. No results for input(s): AMMONIA in the last 168 hours. CBC:  Recent Labs Lab 02/06/15 1155 02/06/15 1231 02/07/15 1310 02/08/15 1021  WBC 10.0  --  6.6 6.2   NEUTROABS 8.5*  --   --   --   HGB 14.1 15.0 11.4* 12.3  HCT 40.5 44.0 33.7* 36.8  MCV 105.7*  --  106.3* 106.4*  PLT 239  --  206 222   Cardiac Enzymes:  Recent Labs Lab 02/06/15 1935 02/06/15 2303 02/07/15 0454  TROPONINI <0.03 <0.03 <0.03   BNP: BNP (last 3 results) No results for input(s): BNP in the last 8760 hours.  ProBNP (last 3 results) No results for input(s): PROBNP in the last 8760 hours.  CBG:  Recent Labs Lab 02/07/15 0640 02/07/15 2043 02/08/15 0640 02/08/15 1046  GLUCAP 95 107* 90 91       Signed:  Gennette Shadix A  [facility] 02/08/2015, 7:42 PM

## 2015-02-08 NOTE — Progress Notes (Signed)
OT Cancellation Note  Patient Details Name: Daisy Williams MRN: [id number] DOB: 01-09-1928   Cancelled Treatment:    Reason Eval/Treat Not Completed: Other (comment) (Pt discharged prior to being seen by OT )  Darlina Rumpf [place], OTR/L [phone]  02/08/2015, 2:40 PM

## 2015-02-08 NOTE — Progress Notes (Signed)
Telemetry showing atrial fib with controlled rate.  Daisy November, MD

## 2015-02-08 NOTE — Care Management Note (Signed)
Case Management Note  Patient Details  Name: Tearsa Kowalewski MRN: [id number] Date of Birth: [birth year]  Subjective/Objective:                   presented with dizziness with associated nausea and vomiting Action/Plan: Discharge planning  Expected Discharge Date:                  Expected Discharge Plan:  Home/Self Care  In-House Referral:     Discharge planning Services  CM Consult, Medication Assistance  Post Acute Care Choice:    Choice offered to:     DME Arranged:    DME Agency:     HH Arranged:    [place] Agency:     Status of Service:  Completed, signed off  Medicare Important Message Given:    Date Medicare IM Given:    Medicare IM give by:    Date Additional Medicare IM Given:    Additional Medicare Important Message give by:     If discussed at [place] of Stay Meetings, dates discussed:    Additional Comments: CM met with pt and husband of pt in room and gave them a free 30 day trial card to cover the cost of her Eliquis.  Pt has Medicare A&B and no med plan. When asked why, pt states she only has eye drops to pay for (up to this pt in time) and a med plan is too expensive on their fixed income.  CM made MD aware the cost of refills will be exorbitant.  MD called back and requested I give pt card as her cardiologist states they will find a resource for refills.  CM gave pt and husband a [facility] resource to pursue a medication plan but they are adamantly opposed to paying a higher monthly premium on their fixed income.  Cm encouraged them to call [facility] as they may meet for reduced/no copays depending on their income and assets.  No other CM needs were communicated. Dellie Catholic, RN 02/08/2015, 2:25 PM

## 2015-02-08 NOTE — Progress Notes (Signed)
Pt d/c to home by car with family. Assessment stable. Prescriptions given. Pt instructed to go to a pharmacy that is opened on sundays to have her eliquis filled. Pt stated she would go to [facility]. Pt has eliquis card.

## 2015-02-10 NOTE — Progress Notes (Signed)
RETRO UR COMPLETED

## 2015-02-13 ENCOUNTER — Ambulatory Visit: Payer: Medicare Other | Admitting: Internal Medicine

## 2015-02-16 ENCOUNTER — Ambulatory Visit (INDEPENDENT_AMBULATORY_CARE_PROVIDER_SITE_OTHER): Payer: Medicare Other | Admitting: Internal Medicine

## 2015-02-16 ENCOUNTER — Encounter: Payer: Self-pay | Admitting: Internal Medicine

## 2015-02-16 VITALS — BP 150/74 | HR 104 | Ht 63.0 in | Wt 133.0 lb

## 2015-02-16 DIAGNOSIS — I4891 Unspecified atrial fibrillation: Secondary | ICD-10-CM

## 2015-02-16 DIAGNOSIS — R030 Elevated blood-pressure reading, without diagnosis of hypertension: Secondary | ICD-10-CM | POA: Diagnosis not present

## 2015-02-16 DIAGNOSIS — R9431 Abnormal electrocardiogram [ECG] [EKG]: Secondary | ICD-10-CM

## 2015-02-16 DIAGNOSIS — I4581 Long QT syndrome: Secondary | ICD-10-CM | POA: Diagnosis not present

## 2015-02-16 DIAGNOSIS — IMO0001 Reserved for inherently not codable concepts without codable children: Secondary | ICD-10-CM

## 2015-02-16 MED ORDER — METOPROLOL TARTRATE 25 MG PO TABS: 12.5000 mg | ORAL_TABLET | Freq: Two times a day (BID) | ORAL | Status: DC

## 2015-02-16 NOTE — Patient Instructions (Addendum)
Your physician has recommended you make the following change in your medication...  >> START metoprolol tartrate 12.5mg  (half of a 25mg  tablet) TWICE DAILY  PLEASE CALL [phone] IF YOU NEED ELIQUIS SAMPLES - YOU CANNOT STOP THIS MEDICATION - IT IS VERY IMPORTANT FOR YOUR ATRIAL FIBRILLATION   Your physician recommends that you schedule a follow-up appointment in: 2-3 weeks with Dr. Debara Pickett - OK to double book  Samples: eliquis 2.5mg  (take 2 PO BID) #28 (1 week supply) - LOT [id number] - Exp: 12/2015

## 2015-02-16 NOTE — Progress Notes (Signed)
OFFICE NOTE  Chief Complaint:  "I feel great", hospital follow-up  Primary Care Physician: Leonard Downing, MD  HPI:  Daisy Williams is a pleasant 81 year old female who presented about 10 days ago with nausea, vomiting and dizziness as well as weakness. This is fairly acute onset and she was noted on admission to be in A. fib with controlled ventricular response. This is a new diagnosis for her. She denies any chest pain. She does have probable hypertension although does not carry a history of that was on few medications. Her CHADSVASC score at least 3 if not higher. There was a possible concern for posterior circulation stroke with her dizziness and nausea related to A. fib. CT and MRI were negative for stroke. Echo was performed which showed EF of 60%, mild AI, moderate biatrial enlargement. It was recommended that she start on Eliquis 5 mg twice a day. She was not placed on rate control due to her IVCD. Heart rate today however is greater than 100. Overall she feels great. However, she remains in atrial fibrillation.  PMHx:  Past Medical History  Diagnosis Date  . Glaucoma   . Inner ear dysfunction   . Melanoma     Skin cancer removal  . Hematuria     Patient reports PCP has followed, improved since using cranberry juice    No past surgical history on file.  FAMHx:  Family History  Problem Relation Age of Onset  . Colon cancer Mother   . Heart disease Father     Father passed away at 58. Was taking heart medicine - "didn't last a month after they put him on it."  . Heart disease Brother     Brother was on heart medicine. He went off his heart medicine and died shortly after - age 8. (heart attack?)  . Other Sister     Sister was a nursing home patient and passed away at 55 of unknown cause.    SOCHx:   reports that she has never smoked. She has never used smokeless tobacco. She reports that she does not drink alcohol or use illicit drugs.  ALLERGIES:  No Known  Allergies  ROS: A comprehensive review of systems was negative.  HOME MEDS: Current Outpatient Prescriptions  Medication Sig Dispense Refill  . apixaban (ELIQUIS) 5 MG TABS tablet Take 1 tablet (5 mg total) by mouth 2 (two) times daily. 60 tablet 0  . CALCIUM-VITAMIN D PO Take 2 tablets by mouth daily.    . Cyanocobalamin (VITAMIN B 12 PO) Take 1 tablet by mouth daily.    . dorzolamide-timolol (COSOPT) 22.3-6.8 MG/ML ophthalmic solution Place 1 drop into both eyes 2 (two) times daily.    Marland Kitchen latanoprost (XALATAN) 0.005 % ophthalmic solution Place 1 drop into both eyes at bedtime.    . pantoprazole (PROTONIX) 40 MG tablet Take 1 tablet (40 mg total) by mouth daily. 30 tablet 0  . metoprolol tartrate (LOPRESSOR) 25 MG tablet Take 0.5 tablets (12.5 mg total) by mouth 2 (two) times daily. 60 tablet 6   No current facility-administered medications for this visit.    LABS/IMAGING: No results found for this or any previous visit (from the past 48 hour(s)). No results found.  WEIGHTS: Wt Readings from Last 3 Encounters:  02/16/15 133 lb (60.328 kg)  02/06/15 130 lb (58.968 kg)    VITALS: BP 150/74 mmHg  Pulse 104  Ht 5\' 3"  (1.6 m)  Wt 133 lb (60.328 kg)  BMI 23.57 kg/m2  EXAM: General appearance: alert and no distress Neck: no carotid bruit and no JVD Lungs: clear to auscultation bilaterally Heart: irregularly irregular rhythm, S1, S2 normal and systolic murmur: early systolic 3/6, blowing at apex Abdomen: soft, non-tender; bowel sounds normal; no masses,  no organomegaly Extremities: extremities normal, atraumatic, no cyanosis or edema Pulses: 2+ and symmetric Skin: Skin color, texture, turgor normal. No rashes or lesions Neurologic: Grossly normal Psych: Pleasant  EKG: A. fib with rapid ventricular response at 104, nonspecific ST and T changes  ASSESSMENT: 1. Persistent atrial fibrillation with rapid ventricular response 2. Normal LV function with moderate biatrial  enlargement 3. Hypertension  PLAN: 1.   Ms. Domke has signs of hypertensive heart disease and is known to have an elevated blood pressure but not currently on medications. She does have moderate biatrial enlargement and some valvular heart disease. She is in persistent A. fib now with a mildly elevated ventricular response. I recommend starting metoprolol tartrate 12.5 mg twice daily. That should help with rate control and her blood pressure. She's been on Eliquis for 10 days. I like to continue this for at least one month and see her back. At which time if she remains in atrial fibrillation, would recommend cardioversion. I discussed the benefits and risks of cardioversion with her in the office today and she is willing to proceed if necessary. She reported cost may be an issue with Eliquis. We may need to have her fill out medication assistance forms. I'll copy Erasmo Downer, our pharmacist on this office visit for assistance.  Pixie Casino, MD, [facility] Attending Cardiologist [place] 02/16/2015, 5:04 PM

## 2015-02-27 ENCOUNTER — Other Ambulatory Visit: Payer: Self-pay | Admitting: *Deleted

## 2015-02-27 MED ORDER — APIXABAN 5 MG PO TABS: 5.0000 mg | ORAL_TABLET | Freq: Two times a day (BID) | ORAL | Status: DC

## 2015-03-02 ENCOUNTER — Telehealth: Payer: Self-pay | Admitting: *Deleted

## 2015-03-02 DIAGNOSIS — H4011X2 Primary open-angle glaucoma, moderate stage: Secondary | ICD-10-CM | POA: Diagnosis not present

## 2015-03-02 DIAGNOSIS — H25013 Cortical age-related cataract, bilateral: Secondary | ICD-10-CM | POA: Diagnosis not present

## 2015-03-02 NOTE — Telephone Encounter (Signed)
Spoke with patient. She reports she sent in proof of income to patient assistance

## 2015-03-02 NOTE — Telephone Encounter (Signed)
Received notification from [place] patient assistance program that patient needed to to provide proof of income. LM for patient to call back so this information can be communicated.  Faxed prescription to [phone]

## 2015-03-04 NOTE — Telephone Encounter (Signed)
Patient reports she will not qualify for patient assistance because her and her husband draw too much social security and they have to take out $104 for each of them.   Patient reports an MD at hospital told her she would not have to pay for Eliquis.   She reports she cannot afford Eliquis.  She states she will leave it is "God's hands"  She states her step son has her same condition and he only takes an aspirin every day.   Patient has enough eliquis to last until her appointment on June 13 - will need address anticoagulation options at this visit.

## 2015-03-09 ENCOUNTER — Ambulatory Visit (INDEPENDENT_AMBULATORY_CARE_PROVIDER_SITE_OTHER): Payer: Medicare Other | Admitting: Internal Medicine

## 2015-03-09 VITALS — BP 120/80 | HR 79 | Ht 63.0 in | Wt 120.0 lb

## 2015-03-09 DIAGNOSIS — IMO0001 Reserved for inherently not codable concepts without codable children: Secondary | ICD-10-CM

## 2015-03-09 DIAGNOSIS — I4891 Unspecified atrial fibrillation: Secondary | ICD-10-CM | POA: Diagnosis not present

## 2015-03-09 DIAGNOSIS — R030 Elevated blood-pressure reading, without diagnosis of hypertension: Secondary | ICD-10-CM | POA: Diagnosis not present

## 2015-03-09 DIAGNOSIS — R9431 Abnormal electrocardiogram [ECG] [EKG]: Secondary | ICD-10-CM

## 2015-03-09 DIAGNOSIS — I4581 Long QT syndrome: Secondary | ICD-10-CM | POA: Diagnosis not present

## 2015-03-09 NOTE — Patient Instructions (Signed)
Dr Debara Pickett recommends that you schedule a follow-up appointment in 1 month.  Medication samples have been provided to the patient. Drug name: Eliquis 5 mg Qty: 42 tabs LOT: [id number] Exp.Date: 01/2017 Donivan Scull 12:14 PM 03/09/2015

## 2015-03-11 ENCOUNTER — Encounter: Payer: Self-pay | Admitting: Internal Medicine

## 2015-03-11 NOTE — Progress Notes (Signed)
OFFICE NOTE  Chief Complaint:  No complaints  Primary Care Physician: Leonard Downing, MD  HPI:  Daisy Williams is a pleasant 81 year old female who presented about 10 days ago with nausea, vomiting and dizziness as well as weakness. This is fairly acute onset and she was noted on admission to be in A. fib with controlled ventricular response. This is a new diagnosis for her. She denies any chest pain. She does have probable hypertension although does not carry a history of that was on few medications. Her CHADSVASC score at least 3 if not higher. There was a possible concern for posterior circulation stroke with her dizziness and nausea related to A. fib. CT and MRI were negative for stroke. Echo was performed which showed EF of 60%, mild AI, moderate biatrial enlargement. It was recommended that she start on Eliquis 5 mg twice a day. She was not placed on rate control due to her IVCD. Heart rate today however is greater than 100. Overall she feels great. However, she remains in atrial fibrillation.  Daisy Williams returns today for follow-up. Her EKG demonstrates atrial fibrillation at a rate of 79. She continues on Eliquis.  PMHx:  Past Medical History  Diagnosis Date  . Glaucoma   . Inner ear dysfunction   . Melanoma     Skin cancer removal  . Hematuria     Patient reports PCP has followed, improved since using cranberry juice    No past surgical history on file.  FAMHx:  Family History  Problem Relation Age of Onset  . Colon cancer Mother   . Heart disease Father     Father passed away at 42. Was taking heart medicine - "didn't last a month after they put him on it."  . Heart disease Brother     Brother was on heart medicine. He went off his heart medicine and died shortly after - age 28. (heart attack?)  . Other Sister     Sister was a nursing home patient and passed away at 84 of unknown cause.    SOCHx:   reports that she has never smoked. She has never used  smokeless tobacco. She reports that she does not drink alcohol or use illicit drugs.  ALLERGIES:  No Known Allergies  ROS: A comprehensive review of systems was negative.  HOME MEDS: Current Outpatient Prescriptions  Medication Sig Dispense Refill  . apixaban (ELIQUIS) 5 MG TABS tablet Take 1 tablet (5 mg total) by mouth 2 (two) times daily. 180 tablet 4  . CALCIUM-VITAMIN D PO Take 2 tablets by mouth daily.    . Cyanocobalamin (VITAMIN B 12 PO) Take 1 tablet by mouth daily.    . dorzolamide-timolol (COSOPT) 22.3-6.8 MG/ML ophthalmic solution Place 1 drop into both eyes 2 (two) times daily.    Marland Kitchen latanoprost (XALATAN) 0.005 % ophthalmic solution Place 1 drop into both eyes at bedtime.    . metoprolol tartrate (LOPRESSOR) 25 MG tablet Take 0.5 tablets (12.5 mg total) by mouth 2 (two) times daily. 60 tablet 6   No current facility-administered medications for this visit.    LABS/IMAGING: No results found for this or any previous visit (from the past 48 hour(s)). No results found.  WEIGHTS: Wt Readings from Last 3 Encounters:  03/09/15 120 lb (54.432 kg)  02/16/15 133 lb (60.328 kg)  02/06/15 130 lb (58.968 kg)    VITALS: BP 120/80 mmHg  Pulse 79  Ht 5\' 3"  (1.6 m)  Wt 120 lb (54.432 kg)  BMI 21.26 kg/m2  EXAM: General appearance: alert and no distress Neck: no carotid bruit and no JVD Lungs: clear to auscultation bilaterally Heart: irregularly irregular rhythm, S1, S2 normal and systolic murmur: early systolic 3/6, blowing at apex Abdomen: soft, non-tender; bowel sounds normal; no masses,  no organomegaly Extremities: extremities normal, atraumatic, no cyanosis or edema Pulses: 2+ and symmetric Skin: Skin color, texture, turgor normal. No rashes or lesions Neurologic: Grossly normal Psych: Pleasant  EKG: A. fib with response at 79 , nonspecific ST and T changes  ASSESSMENT: 1. Persistent atrial fibrillation with rapid ventricular response 2. Normal LV function with  moderate biatrial enlargement 3. Hypertension  PLAN: 1.   Ms. Williams has now been on Eliquis for one month. She remains in atrial fibrillation. Her rate is controlled. We discussed cardioversion however she is hesitant to do that at this time. She wants to discuss it further with her daughter. I asked her to contact me if she should decide to go forward and asked that we don't delay much further in proceeding to cardioversion. She understands that if she misses a dose of her anticoagulation and that we may need to anticoagulate her for an additional month.   Pixie Casino, MD, [facility] Attending Cardiologist [place] 03/11/2015, 5:25 PM

## 2015-03-11 NOTE — Telephone Encounter (Signed)
Faxed prescription and documentation of patient's/spouse's income (1099 forms) to [facility] patient assitance

## 2015-03-16 NOTE — Telephone Encounter (Signed)
Received notification that patient was approved for [facility] patient assistance for Eliquis from 03/11/2015 until 09/26/2015 Spoke with patient and she received a call last week from the patient assistance team with this notification as well.

## 2015-03-17 ENCOUNTER — Telehealth: Payer: Self-pay | Admitting: *Deleted

## 2015-03-17 NOTE — Telephone Encounter (Signed)
Patient notified her Eliquis (3 bottles of #60 tablets = 180 tablets = 90 day supply) is ready for her to pick up. Medication in sample bag with patient name placed in Kristin's office and patient instructed to inform front desk that her medication is in the location.

## 2015-03-17 NOTE — Telephone Encounter (Signed)
Returning your call. °

## 2015-03-17 NOTE — Telephone Encounter (Signed)
LM for patient to call back - her eliquis from [place] patient assistance is in the office for her to pick up

## 2015-04-23 ENCOUNTER — Ambulatory Visit (INDEPENDENT_AMBULATORY_CARE_PROVIDER_SITE_OTHER): Payer: Medicare Other | Admitting: Internal Medicine

## 2015-04-23 VITALS — BP 159/76 | HR 70 | Ht 63.0 in | Wt 128.0 lb

## 2015-04-23 DIAGNOSIS — R030 Elevated blood-pressure reading, without diagnosis of hypertension: Secondary | ICD-10-CM

## 2015-04-23 DIAGNOSIS — I4891 Unspecified atrial fibrillation: Secondary | ICD-10-CM

## 2015-04-23 DIAGNOSIS — R9431 Abnormal electrocardiogram [ECG] [EKG]: Secondary | ICD-10-CM

## 2015-04-23 DIAGNOSIS — IMO0001 Reserved for inherently not codable concepts without codable children: Secondary | ICD-10-CM

## 2015-04-23 DIAGNOSIS — I4581 Long QT syndrome: Secondary | ICD-10-CM

## 2015-04-23 NOTE — Patient Instructions (Signed)
Your physician recommends that you schedule a follow-up appointment in 5-6 months with Dr. Debara Pickett

## 2015-04-24 ENCOUNTER — Encounter: Payer: Self-pay | Admitting: Internal Medicine

## 2015-04-24 NOTE — Progress Notes (Signed)
OFFICE NOTE  Chief Complaint:  No complaints  Primary Care Physician: Leonard Downing, MD  HPI:  Daisy Williams is a pleasant 81 year old female who presented about 10 days ago with nausea, vomiting and dizziness as well as weakness. This is fairly acute onset and she was noted on admission to be in A. fib with controlled ventricular response. This is a new diagnosis for her. She denies any chest pain. She does have probable hypertension although does not carry a history of that was on few medications. Her CHADSVASC score at least 3 if not higher. There was a possible concern for posterior circulation stroke with her dizziness and nausea related to A. fib. CT and MRI were negative for stroke. Echo was performed which showed EF of 60%, mild AI, moderate biatrial enlargement. It was recommended that she start on Eliquis 5 mg twice a day. She was not placed on rate control due to her IVCD. Heart rate today however is greater than 100. Overall she feels great. However, she remains in atrial fibrillation.  Daisy Williams returns today for follow-up. Her EKG demonstrates atrial fibrillation at a rate of 79. She continues on Eliquis.  I saw Daisy Williams back in the office today. She continues to be rate controlled and in atrial fibrillation with an incomplete right bundle branch block. She reports being asymptomatic with her A. fib. We further discussed cardioversion and she is not interested at this time. I don't think there is can that Pilling evidence at this point since she is asymptomatic to push her toward a cardioversion. She does understand if she remains in A. fib for a long period of time this may become permanent. She seems to be tolerating Eliquis without any bleeding problems.  PMHx:  Past Medical History  Diagnosis Date  . Glaucoma   . Inner ear dysfunction   . Melanoma     Skin cancer removal  . Hematuria     Patient reports PCP has followed, improved since using cranberry  juice    No past surgical history on file.  FAMHx:  Family History  Problem Relation Age of Onset  . Colon cancer Mother   . Heart disease Father     Father passed away at 27. Was taking heart medicine - "didn't last a month after they put him on it."  . Heart disease Brother     Brother was on heart medicine. He went off his heart medicine and died shortly after - age 16. (heart attack?)  . Other Sister     Sister was a nursing home patient and passed away at 20 of unknown cause.    SOCHx:   reports that she has never smoked. She has never used smokeless tobacco. She reports that she does not drink alcohol or use illicit drugs.  ALLERGIES:  No Known Allergies  ROS: A comprehensive review of systems was negative.  HOME MEDS: Current Outpatient Prescriptions  Medication Sig Dispense Refill  . apixaban (ELIQUIS) 5 MG TABS tablet Take 1 tablet (5 mg total) by mouth 2 (two) times daily. 180 tablet 4  . CALCIUM-VITAMIN D PO Take 2 tablets by mouth daily.    . Cyanocobalamin (VITAMIN B 12 PO) Take 1 tablet by mouth daily.    . dorzolamide-timolol (COSOPT) 22.3-6.8 MG/ML ophthalmic solution Place 1 drop into both eyes 2 (two) times daily.    Marland Kitchen latanoprost (XALATAN) 0.005 % ophthalmic solution Place 1 drop into both eyes at bedtime.    . metoprolol  tartrate (LOPRESSOR) 25 MG tablet Take 0.5 tablets (12.5 mg total) by mouth 2 (two) times daily. 60 tablet 6   No current facility-administered medications for this visit.    LABS/IMAGING: No results found for this or any previous visit (from the past 48 hour(s)). No results found.  WEIGHTS: Wt Readings from Last 3 Encounters:  04/23/15 128 lb (58.06 kg)  03/09/15 120 lb (54.432 kg)  02/16/15 133 lb (60.328 kg)    VITALS: BP 159/76 mmHg  Pulse 70  Ht 5\' 3"  (1.6 m)  Wt 128 lb (58.06 kg)  BMI 22.68 kg/m2  EXAM: Deferred  EKG: A. fib with response at 70 , nonspecific ST and T changes  ASSESSMENT: 1. Persistent atrial  fibrillation with CVR 2. Normal LV function with moderate biatrial enlargement 3. Hypertension 4. CHADSVASC 4 on Eliquis  PLAN: 1.   Daisy Williams is appropriately anticoagulated and rate controlled. She reports being asymptomatic with her A. fib and therefore does not want to have cardioversion. I'm okay with this we'll continue with rate control strategy and anticoagulation. Plan to see her back in 6 months.  Pixie Casino, MD, [facility] Attending Cardiologist [place] 04/24/2015, 10:18 AM

## 2015-06-24 DIAGNOSIS — Z23 Encounter for immunization: Secondary | ICD-10-CM | POA: Diagnosis not present

## 2015-08-03 DIAGNOSIS — H401132 Primary open-angle glaucoma, bilateral, moderate stage: Secondary | ICD-10-CM | POA: Diagnosis not present

## 2015-08-03 DIAGNOSIS — H25013 Cortical age-related cataract, bilateral: Secondary | ICD-10-CM | POA: Diagnosis not present

## 2015-09-07 ENCOUNTER — Telehealth: Payer: Self-pay

## 2015-09-07 NOTE — Telephone Encounter (Signed)
Eliquis 5 mg 90 supply from [employer], delivered here instead of [facility]. Patient advised of this, and she will pick them up here.

## 2015-10-22 ENCOUNTER — Encounter: Payer: Self-pay | Admitting: Internal Medicine

## 2015-10-22 ENCOUNTER — Ambulatory Visit (INDEPENDENT_AMBULATORY_CARE_PROVIDER_SITE_OTHER): Payer: Medicare Other | Admitting: Internal Medicine

## 2015-10-22 VITALS — BP 142/80 | HR 74 | Ht 63.0 in | Wt 127.3 lb

## 2015-10-22 DIAGNOSIS — I4581 Long QT syndrome: Secondary | ICD-10-CM | POA: Diagnosis not present

## 2015-10-22 DIAGNOSIS — I4891 Unspecified atrial fibrillation: Secondary | ICD-10-CM | POA: Diagnosis not present

## 2015-10-22 DIAGNOSIS — R42 Dizziness and giddiness: Secondary | ICD-10-CM | POA: Diagnosis not present

## 2015-10-22 DIAGNOSIS — R9431 Abnormal electrocardiogram [ECG] [EKG]: Secondary | ICD-10-CM

## 2015-10-22 NOTE — Patient Instructions (Signed)
Dr Debara Pickett has made no changes to your current medications or treatment plan.  Your physician recommends that you schedule a follow-up appointment in 6 months. You will receive a reminder letter in the mail two months in advance. If you don't receive a letter, please call our office to schedule the follow-up appointment.  If you need a refill on your cardiac medications before your next appointment, please call your pharmacy.

## 2015-10-23 MED ORDER — APIXABAN 5 MG PO TABS: 5.0000 mg | ORAL_TABLET | Freq: Two times a day (BID) | ORAL | Status: DC

## 2015-10-25 NOTE — Progress Notes (Signed)
OFFICE NOTE  Chief Complaint:  No complaints  Primary Care Physician: Leonard Downing, MD  HPI:  Daisy Williams is a pleasant 82 year old female who presented about 10 days ago with nausea, vomiting and dizziness as well as weakness. This is fairly acute onset and she was noted on admission to be in A. fib with controlled ventricular response. This is a new diagnosis for her. She denies any chest pain. She does have probable hypertension although does not carry a history of that was on few medications. Her CHADSVASC score at least 3 if not higher. There was a possible concern for posterior circulation stroke with her dizziness and nausea related to A. fib. CT and MRI were negative for stroke. Echo was performed which showed EF of 60%, mild AI, moderate biatrial enlargement. It was recommended that she start on Eliquis 5 mg twice a day. She was not placed on rate control due to her IVCD. Heart rate today however is greater than 100. Overall she feels great. However, she remains in atrial fibrillation.  [name] returns today for follow-up. Her EKG demonstrates atrial fibrillation at a rate of 79. She continues on Eliquis.  I saw [name] back in the office today. She continues to be rate controlled and in atrial fibrillation with an incomplete right bundle branch block. She reports being asymptomatic with her A. fib. We further discussed cardioversion and she is not interested at this time. I don't think there is can that Pilling evidence at this point since she is asymptomatic to push her toward a cardioversion. She does understand if she remains in A. fib for a long period of time this may become permanent. She seems to be tolerating Eliquis without any bleeding problems.  [name] returns today for follow-up. She is without complaints. She denies any palpitations or knowledge of atrial fibrillation however her EKG shows persistent atrial fibrillation today with incomplete  right bundle branch block at 74. This is unchanged from prior studies. She is taking Eliquis without any bleeding complications. She is on low-dose Toprol and blood pressure is high normal.  PMHx:  Past Medical History  Diagnosis Date  . Glaucoma   . Inner ear dysfunction   . Melanoma ([place])     Skin cancer removal  . Hematuria     Patient reports PCP has followed, improved since using cranberry juice    No past surgical history on file.  FAMHx:  Family History  Problem Relation Age of Onset  . Colon cancer Mother   . Heart disease Father     Father passed away at 59. Was taking heart medicine - "didn't last a month after they put him on it."  . Heart disease Brother     Brother was on heart medicine. He went off his heart medicine and died shortly after - age 25. (heart attack?)  . Other Sister     Sister was a nursing home patient and passed away at 9 of unknown cause.    SOCHx:   reports that she has never smoked. She has never used smokeless tobacco. She reports that she does not drink alcohol or use illicit drugs.  ALLERGIES:  No Known Allergies  ROS: A comprehensive review of systems was negative.  HOME MEDS: Current Outpatient Prescriptions  Medication Sig Dispense Refill  . apixaban (ELIQUIS) 5 MG TABS tablet Take 1 tablet (5 mg total) by mouth 2 (two) times daily. 180 tablet 4  . CALCIUM-VITAMIN D PO Take 2  tablets by mouth daily.    . Cyanocobalamin (VITAMIN B 12 PO) Take 1 tablet by mouth daily.    . dorzolamide-timolol (COSOPT) 22.3-6.8 MG/ML ophthalmic solution Place 1 drop into both eyes 2 (two) times daily.    Marland Kitchen latanoprost (XALATAN) 0.005 % ophthalmic solution Place 1 drop into both eyes at bedtime.    . metoprolol tartrate (LOPRESSOR) 25 MG tablet Take 0.5 tablets (12.5 mg total) by mouth 2 (two) times daily. 60 tablet 6   No current facility-administered medications for this visit.    LABS/IMAGING: No results found for this or any previous visit  (from the past 48 hour(s)). No results found.  WEIGHTS: Wt Readings from Last 3 Encounters:  10/22/15 127 lb 4.8 oz (57.743 kg)  04/23/15 128 lb (58.06 kg)  03/09/15 120 lb (54.432 kg)    VITALS: BP 142/80 mmHg  Pulse 74  Ht 5\' 3"  (1.6 m)  Wt 127 lb 4.8 oz (57.743 kg)  BMI 22.56 kg/m2  EXAM: General appearance: alert and no distress Neck: no carotid bruit and no JVD Lungs: clear to auscultation bilaterally Heart: regular rate and rhythm, S1, S2 normal, no murmur, click, rub or gallop Abdomen: soft, non-tender; bowel sounds normal; no masses,  no organomegaly Extremities: extremities normal, atraumatic, no cyanosis or edema Pulses: 2+ and symmetric Skin: Skin color, texture, turgor normal. No rashes or lesions Neurologic: Grossly normal Psych: Pleasant  EKG: A. fib with controlled ventricular response at 74, nonspecific ST and T changes  ASSESSMENT: 1. Persistent atrial fibrillation with CVR 2. Normal LV function with moderate biatrial enlargement 3. Hypertension - controlled 4. CHADSVASC 4 on Eliquis  PLAN: 1.   Ms. Berardino is appropriately anticoagulated and rate controlled. She reports being asymptomatic with her A. fib and therefore does not want to have cardioversion. I'm okay with this we'll continue with rate control strategy and anticoagulation. Plan to see her back in 6 months.  Pixie Casino, MD, [facility] Attending Cardiologist [place] C Aleksa [place] 10/25/2015, 6:39 PM

## 2015-11-17 DIAGNOSIS — L820 Inflamed seborrheic keratosis: Secondary | ICD-10-CM | POA: Diagnosis not present

## 2015-12-04 ENCOUNTER — Telehealth: Payer: Self-pay

## 2015-12-04 NOTE — Telephone Encounter (Signed)
Eliquis 5 mg 3 bottles of 60 ea arrived from [facility] program. Patient notified and will pick up Monday.

## 2016-02-29 DIAGNOSIS — H401132 Primary open-angle glaucoma, bilateral, moderate stage: Secondary | ICD-10-CM | POA: Diagnosis not present

## 2016-03-02 ENCOUNTER — Telehealth: Payer: Self-pay | Admitting: *Deleted

## 2016-03-02 NOTE — Telephone Encounter (Signed)
Spoke with patient to let her know that her medication had arrived from the company.

## 2016-04-04 ENCOUNTER — Other Ambulatory Visit: Payer: Self-pay | Admitting: Internal Medicine

## 2016-04-25 ENCOUNTER — Ambulatory Visit (INDEPENDENT_AMBULATORY_CARE_PROVIDER_SITE_OTHER): Payer: Medicare Other | Admitting: Internal Medicine

## 2016-04-25 ENCOUNTER — Encounter: Payer: Self-pay | Admitting: Internal Medicine

## 2016-04-25 VITALS — BP 126/57 | HR 66 | Ht 63.0 in | Wt 126.0 lb

## 2016-04-25 DIAGNOSIS — I4891 Unspecified atrial fibrillation: Secondary | ICD-10-CM | POA: Diagnosis not present

## 2016-04-25 DIAGNOSIS — R030 Elevated blood-pressure reading, without diagnosis of hypertension: Secondary | ICD-10-CM

## 2016-04-25 DIAGNOSIS — R42 Dizziness and giddiness: Secondary | ICD-10-CM

## 2016-04-25 DIAGNOSIS — IMO0001 Reserved for inherently not codable concepts without codable children: Secondary | ICD-10-CM

## 2016-04-25 NOTE — Patient Instructions (Signed)
Medication Instructions:  Your physician recommends that you continue on your current medications as directed. Please refer to the Current Medication list given to you today.  Labwork: NONE ORDERED  Testing/Procedures: NONE ORDERED  Follow-Up: Your physician wants you to follow-up in: [place]. You will receive a reminder letter in the mail two months in advance. If you don't receive a letter, please call our office to schedule the follow-up appointment.  Any Other Special Instructions Will Be Listed Below (If Applicable).     If you need a refill on your cardiac medications before your next appointment, please call your pharmacy.

## 2016-04-25 NOTE — Progress Notes (Signed)
OFFICE NOTE  Chief Complaint:  No complaints  Primary Care Physician: Leonard Downing, MD  HPI:  Daisy Williams is a pleasant 82 year old female who presented about 10 days ago with nausea, vomiting and dizziness as well as weakness. This is fairly acute onset and she was noted on admission to be in A. fib with controlled ventricular response. This is a new diagnosis for her. She denies any chest pain. She does have probable hypertension although does not carry a history of that was on few medications. Her CHADSVASC score at least 3 if not higher. There was a possible concern for posterior circulation stroke with her dizziness and nausea related to A. fib. CT and MRI were negative for stroke. Echo was performed which showed EF of 60%, mild AI, moderate biatrial enlargement. It was recommended that she start on Eliquis 5 mg twice a day. She was not placed on rate control due to her IVCD. Heart rate today however is greater than 100. Overall she feels great. However, she remains in atrial fibrillation.  Daisy Williams returns today for follow-up. Her EKG demonstrates atrial fibrillation at a rate of 79. She continues on Eliquis.  I saw Daisy Williams back in the office today. She continues to be rate controlled and in atrial fibrillation with an incomplete right bundle branch block. She reports being asymptomatic with her A. fib. We further discussed cardioversion and she is not interested at this time. I don't think there is can that Pilling evidence at this point since she is asymptomatic to push her toward a cardioversion. She does understand if she remains in A. fib for a long period of time this may become permanent. She seems to be tolerating Eliquis without any bleeding problems.  Daisy Williams returns today for follow-up. She is without complaints. She denies any palpitations or knowledge of atrial fibrillation however her EKG shows persistent atrial fibrillation today with incomplete  right bundle branch block at 74. This is unchanged from prior studies. She is taking Eliquis without any bleeding complications. She is on low-dose Toprol and blood pressure is high normal.  04/25/2016  Daisy Williams was seen back today in the office for follow-up. She remains in persistent A. fib with controlled ventricular response. She's had no bleeding, occasions on Eliquis. Blood pressures been well controlled and she remains asymptomatic.  PMHx:  Past Medical History:  Diagnosis Date  . Glaucoma   . Hematuria    Patient reports PCP has followed, improved since using cranberry juice  . Inner ear dysfunction   . Melanoma ([place])    Skin cancer removal    No past surgical history on file.  FAMHx:  Family History  Problem Relation Age of Onset  . Colon cancer Mother   . Heart disease Father     Father passed away at 69. Was taking heart medicine - "didn't last a month after they put him on it."  . Heart disease Brother     Brother was on heart medicine. He went off his heart medicine and died shortly after - age 24. (heart attack?)  . Other Sister     Sister was a nursing home patient and passed away at 69 of unknown cause.    SOCHx:   reports that she has never smoked. She has never used smokeless tobacco. She reports that she does not drink alcohol or use drugs.  ALLERGIES:  No Known Allergies  ROS: A comprehensive review of systems was negative.  HOME MEDS: Current Outpatient  Prescriptions  Medication Sig Dispense Refill  . apixaban (ELIQUIS) 5 MG TABS tablet Take 1 tablet (5 mg total) by mouth 2 (two) times daily. 180 tablet 4  . CALCIUM-VITAMIN D PO Take 2 tablets by mouth daily.    . Cyanocobalamin (VITAMIN B 12 PO) Take 1 tablet by mouth daily.    . dorzolamide-timolol (COSOPT) 22.3-6.8 MG/ML ophthalmic solution Place 1 drop into both eyes 2 (two) times daily.    Marland Kitchen latanoprost (XALATAN) 0.005 % ophthalmic solution Place 1 drop into both eyes at bedtime.    . metoprolol  tartrate (LOPRESSOR) 25 MG tablet TAKE 1/2 TABLET (12.5 MG TOTAL) BY MOUTH 2 TIMES DAILY. 90 tablet 1   No current facility-administered medications for this visit.     LABS/IMAGING: No results found for this or any previous visit (from the past 48 hour(s)). No results found.  WEIGHTS: Wt Readings from Last 3 Encounters:  04/25/16 126 lb (57.2 kg)  10/22/15 127 lb 4.8 oz (57.7 kg)  04/23/15 128 lb (58.1 kg)    VITALS: BP (!) 126/57 (BP Location: Right Arm, Patient Position: Sitting, Cuff Size: Normal)   Pulse 66   Ht 5\' 3"  (1.6 m)   Wt 126 lb (57.2 kg)   SpO2 99%   BMI 22.32 kg/m   EXAM: General appearance: alert and no distress Neck: no carotid bruit and no JVD Lungs: clear to auscultation bilaterally Heart: regular rate and rhythm, S1, S2 normal, no murmur, click, rub or gallop Abdomen: soft, non-tender; bowel sounds normal; no masses,  no organomegaly Extremities: extremities normal, atraumatic, no cyanosis or edema Pulses: 2+ and symmetric Skin: Skin color, texture, turgor normal. No rashes or lesions Neurologic: Grossly normal Psych: Pleasant  EKG: Deferred  ASSESSMENT: 1. Persistent atrial fibrillation with CVR 2. Normal LV function with moderate biatrial enlargement 3. Hypertension - controlled 4. CHADSVASC 4 on Eliquis  PLAN: 1.   Daisy Williams is appropriately anticoagulated and rate controlled. She reports being asymptomatic with her A. Fib. Plan to see her back in 6 months.  Pixie Casino, MD, [facility] Attending Cardiologist [place] 04/25/2016, 12:54 PM

## 2016-05-24 ENCOUNTER — Telehealth: Payer: Self-pay

## 2016-05-24 NOTE — Telephone Encounter (Signed)
Patient notified that Eliquis 5mg  3 bottles has arrived from [place]. She is aware to pick them up at the front desk.

## 2016-06-28 DIAGNOSIS — Z23 Encounter for immunization: Secondary | ICD-10-CM | POA: Diagnosis not present

## 2016-06-28 DIAGNOSIS — R7301 Impaired fasting glucose: Secondary | ICD-10-CM | POA: Diagnosis not present

## 2016-06-28 DIAGNOSIS — I4891 Unspecified atrial fibrillation: Secondary | ICD-10-CM | POA: Diagnosis not present

## 2016-07-20 ENCOUNTER — Encounter (HOSPITAL_COMMUNITY): Payer: Self-pay

## 2016-07-20 ENCOUNTER — Emergency Department (HOSPITAL_COMMUNITY)
Admission: EM | Admit: 2016-07-20 | Discharge: 2016-07-20 | Disposition: A | Discharge status: Home / Self Care | Payer: Medicare Other | Attending: Emergency Medicine | Admitting: Emergency Medicine

## 2016-07-20 DIAGNOSIS — R112 Nausea with vomiting, unspecified: Secondary | ICD-10-CM | POA: Insufficient documentation

## 2016-07-20 DIAGNOSIS — Z7901 Long term (current) use of anticoagulants: Secondary | ICD-10-CM | POA: Insufficient documentation

## 2016-07-20 DIAGNOSIS — Z85828 Personal history of other malignant neoplasm of skin: Secondary | ICD-10-CM | POA: Insufficient documentation

## 2016-07-20 LAB — CBC WITH DIFFERENTIAL/PLATELET
BASOS ABS: 0.1 10*3/uL (ref 0.0–0.1)
BASOS PCT: 1 %
EOS ABS: 0.1 10*3/uL (ref 0.0–0.7)
EOS PCT: 1 %
HCT: 41.4 % (ref 36.0–46.0)
Hemoglobin: 14.5 g/dL (ref 12.0–15.0)
Lymphocytes Relative: 20 %
Lymphs Abs: 1.6 10*3/uL (ref 0.7–4.0)
MCH: 36.8 pg — ABNORMAL HIGH (ref 26.0–34.0)
MCHC: 35 g/dL (ref 30.0–36.0)
MCV: 105.1 fL — ABNORMAL HIGH (ref 78.0–100.0)
Monocytes Absolute: 0.8 10*3/uL (ref 0.1–1.0)
Monocytes Relative: 10 %
Neutro Abs: 5.4 10*3/uL (ref 1.7–7.7)
Neutrophils Relative %: 68 %
PLATELETS: 309 10*3/uL (ref 150–400)
RBC: 3.94 MIL/uL (ref 3.87–5.11)
RDW: 13.4 % (ref 11.5–15.5)
WBC: 7.9 10*3/uL (ref 4.0–10.5)

## 2016-07-20 LAB — URINALYSIS, ROUTINE W REFLEX MICROSCOPIC
BILIRUBIN URINE: NEGATIVE
Glucose, UA: NEGATIVE mg/dL
KETONES UR: 15 mg/dL — AB
Nitrite: NEGATIVE
PROTEIN: 30 mg/dL — AB
Specific Gravity, Urine: 1.014 (ref 1.005–1.030)
pH: 7.5 (ref 5.0–8.0)

## 2016-07-20 LAB — COMPREHENSIVE METABOLIC PANEL
ALBUMIN: 4 g/dL (ref 3.5–5.0)
ALT: 21 U/L (ref 14–54)
AST: 28 U/L (ref 15–41)
Alkaline Phosphatase: 46 U/L (ref 38–126)
Anion gap: 11 (ref 5–15)
BILIRUBIN TOTAL: 1.1 mg/dL (ref 0.3–1.2)
BUN: 5 mg/dL — AB (ref 6–20)
CALCIUM: 9.5 mg/dL (ref 8.9–10.3)
CO2: 21 mmol/L — ABNORMAL LOW (ref 22–32)
Chloride: 104 mmol/L (ref 101–111)
Creatinine, Ser: 0.85 mg/dL (ref 0.44–1.00)
GFR calc Af Amer: >60 mL/min (ref >60)
Glucose, Bld: 128 mg/dL — ABNORMAL HIGH (ref 65–99)
POTASSIUM: 3.5 mmol/L (ref 3.5–5.1)
SODIUM: 136 mmol/L (ref 135–145)
TOTAL PROTEIN: 7 g/dL (ref 6.5–8.1)

## 2016-07-20 LAB — URINE MICROSCOPIC-ADD ON: BACTERIA UA: NONE SEEN

## 2016-07-20 LAB — LIPASE, BLOOD: LIPASE: 28 U/L (ref 11–51)

## 2016-07-20 MED ORDER — ONDANSETRON HCL 4 MG/2ML IJ SOLN
4.0000 mg | Freq: Once | INTRAMUSCULAR | Status: AC
  Administered 2016-07-20: 4 mg via INTRAVENOUS
  Filled 2016-07-20: qty 2

## 2016-07-20 MED ORDER — ONDANSETRON 4 MG PO TBDP: 4.0000 mg | ORAL_TABLET | Freq: Three times a day (TID) | ORAL | 0 refills | Status: DC | PRN

## 2016-07-20 MED ORDER — SODIUM CHLORIDE 0.9 % IV BOLUS (SEPSIS)
500.0000 mL | Freq: Once | INTRAVENOUS | Status: AC
  Administered 2016-07-20: 500 mL via INTRAVENOUS

## 2016-07-20 NOTE — ED Notes (Signed)
Pt was able to ambulate in hallway with no assistance. Pt had no complaints. Stated "I feel like a new person". Pt is back in room and placed in monitor at this time.

## 2016-07-20 NOTE — ED Triage Notes (Signed)
Patient here with 3 days of nausea and vomiting after eating out on Saturday. No vomiting since Monday. Complains of weakness and fatigue since this GI upset. Patient alert and oriented, no abdominal pain

## 2016-07-20 NOTE — ED Provider Notes (Signed)
[name]   CSN: [id number] Arrival date & time: 07/20/16  1037     History   Chief Complaint Chief Complaint  Patient presents with  . Emesis    HPI Daisy Williams is a 82 y.o. female.  HPI Patient presents with nausea vomiting and lightheadedness. Began around 5 days ago. States that she thinks she ate something bad. No headache. States she does feel as if things are moving around 2. States is more lightheadedness though states she has not been able keep much down. No fevers or chills. She is on eliquis and has a history of atrial fibrillation. Reviewing old records it appears as if she's had episodes of this dizziness in the past. She was worked up for stroke at that time. No dysuria. No chest pain.   Past Medical History:  Diagnosis Date  . Glaucoma   . Hematuria    Patient reports PCP has followed, improved since using cranberry juice  . Inner ear dysfunction   . Melanoma ([place])    Skin cancer removal    Patient Active Problem List   Diagnosis Date Noted  . Postural dizziness 02/06/2015  . Atrial fibrillation with controlled ventricular response ([place]) 02/06/2015  . Dehydration 02/06/2015  . Macrocytosis 02/06/2015  . Acute hyperglycemia 02/06/2015  . Prolonged QT interval 02/06/2015  . Glaucoma 02/06/2015  . Elevated blood pressure 02/06/2015  . Vomiting 02/06/2015  . Dizziness 02/06/2015  . Microscopic hematuria 02/06/2015    History reviewed. No pertinent surgical history.  OB History    No data available       Home Medications    Prior to Admission medications   Medication Sig Start Date End Date Taking? Authorizing Provider  apixaban (ELIQUIS) 5 MG TABS tablet Take 1 tablet (5 mg total) by mouth 2 (two) times daily. 10/23/15  Yes Pixie Casino, MD  CALCIUM-VITAMIN D PO Take 2 tablets by mouth daily.   Yes Historical Provider, MD  Cyanocobalamin (VITAMIN B 12 PO) Take 1 tablet by mouth daily.   Yes Historical Provider, MD    dorzolamide-timolol (COSOPT) 22.3-6.8 MG/ML ophthalmic solution Place 1 drop into both eyes 2 (two) times daily. 07/28/14  Yes Historical Provider, MD  latanoprost (XALATAN) 0.005 % ophthalmic solution Place 1 drop into both eyes at bedtime. 01/01/15  Yes Historical Provider, MD  metoprolol tartrate (LOPRESSOR) 25 MG tablet TAKE 1/2 TABLET (12.5 MG TOTAL) BY MOUTH 2 TIMES DAILY. 04/04/16  Yes Pixie Casino, MD  ondansetron (ZOFRAN-ODT) 4 MG disintegrating tablet Take 1 tablet (4 mg total) by mouth every 8 (eight) hours as needed for nausea or vomiting. 07/20/16   Davonna Belling, MD    Family History Family History  Problem Relation Age of Onset  . Colon cancer Mother   . Heart disease Father     Father passed away at 20. Was taking heart medicine - "didn't last a month after they put him on it."  . Heart disease Brother     Brother was on heart medicine. He went off his heart medicine and died shortly after - age 15. (heart attack?)  . Other Sister     Sister was a nursing home patient and passed away at 85 of unknown cause.    Social History Social History  Substance Use Topics  . Smoking status: Never Smoker  . Smokeless tobacco: Never Used  . Alcohol use No     Allergies   Review of patient's allergies indicates no known allergies.  Review of Systems Review of Systems  Constitutional: Positive for appetite change.  HENT: Negative for congestion.   Respiratory: Negative for chest tightness and shortness of breath.   Cardiovascular: Negative for chest pain.  Gastrointestinal: Positive for nausea and vomiting. Negative for abdominal pain.  Genitourinary: Negative for difficulty urinating.  Musculoskeletal: Negative for back pain.  Skin: Negative for wound.  Neurological: Positive for dizziness and light-headedness.     Physical Exam Updated Vital Signs BP 156/68 (BP Location: Right Arm)   Pulse 99   Temp 98.4 F (36.9 C) (Oral)   Resp 14   SpO2 98%   Physical  Exam  Constitutional: She appears well-developed.  HENT:  Head: Atraumatic.  Bilateral TMs normal.  Eyes: EOM are normal.  Neck: Neck supple.  Cardiovascular: Normal rate.   Pulmonary/Chest: Effort normal.  Abdominal: Soft.  Musculoskeletal: She exhibits no edema.  Neurological:  Finger-nose intact bilaterally. Face symmetric. Some staccato movements with eye movements.  Skin: Skin is warm.  Psychiatric: She has a normal mood and affect.     ED Treatments / Results  Labs (all labs ordered are listed, but only abnormal results are displayed) Labs Reviewed  COMPREHENSIVE METABOLIC PANEL - Abnormal; Notable for the following:       Result Value   CO2 21 (*)    Glucose, Bld 128 (*)    BUN 5 (*)    All other components within normal limits  URINALYSIS, ROUTINE W REFLEX MICROSCOPIC (NOT AT [facility]) - Abnormal; Notable for the following:    APPearance HAZY (*)    Hgb urine dipstick LARGE (*)    Ketones, ur 15 (*)    Protein, ur 30 (*)    Leukocytes, UA MODERATE (*)    All other components within normal limits  CBC WITH DIFFERENTIAL/PLATELET - Abnormal; Notable for the following:    MCV 105.1 (*)    MCH 36.8 (*)    All other components within normal limits  URINE MICROSCOPIC-ADD ON - Abnormal; Notable for the following:    Squamous Epithelial / LPF 0-5 (*)    Casts HYALINE CASTS (*)    All other components within normal limits  LIPASE, BLOOD    EKG  EKG Interpretation  Date/Time:  Wednesday July 20 2016 11:32:45 EDT Ventricular Rate:  86 PR Interval:    QRS Duration: 126 QT Interval:  406 QTC Calculation: 486 R Axis:   70 Text Interpretation:  Atrial fibrillation Right bundle branch block Confirmed by Alvino Chapel  MD, Ovid Curd [id number]) on 07/20/2016 11:35:59 AM Also confirmed by Alvino Chapel  MD, Keylor Rands [id number]), editor Rolla Plate, Joelene Millin [phone])  on 07/20/2016 12:08:04 PM       Radiology No results found.  Procedures Procedures (including critical care  time)  Medications Ordered in ED Medications  sodium chloride 0.9 % bolus 500 mL (0 mLs Intravenous Stopped 07/20/16 1226)  ondansetron (ZOFRAN) injection 4 mg (4 mg Intravenous Given 07/20/16 1228)     Initial Impression / Assessment and Plan / ED Course  I have reviewed the triage vital signs and the nursing notes.  Pertinent labs & imaging results that were available during my care of the patient were reviewed by me and considered in my medical decision making (see chart for details).  Clinical Course    Patient with nausea vomiting. Feels dizzy 2. Likely somewhat related to dehydration. History of A. fib and is on anticoagulation. Has previously been worked up for central stroke with similar symptoms I think she does not need  more forward this time. Feels much better after treatment will be discharged home. Zofran given. QT reviewed.  Final Clinical Impressions(s) / ED Diagnoses   Final diagnoses:  Non-intractable vomiting with nausea, unspecified vomiting type    New Prescriptions New Prescriptions   ONDANSETRON (ZOFRAN-ODT) 4 MG DISINTEGRATING TABLET    Take 1 tablet (4 mg total) by mouth every 8 (eight) hours as needed for nausea or vomiting.     Davonna Belling, MD 07/20/16 [phone]

## 2016-08-02 DIAGNOSIS — H401132 Primary open-angle glaucoma, bilateral, moderate stage: Secondary | ICD-10-CM | POA: Diagnosis not present

## 2016-08-15 ENCOUNTER — Telehealth: Payer: Self-pay

## 2016-08-15 NOTE — Telephone Encounter (Signed)
Left message for patient that Eliquis 5mg  3 bottles had arrived from [facility]. meds placed at the front desk for pickup.

## 2016-09-28 ENCOUNTER — Other Ambulatory Visit: Payer: Self-pay | Admitting: Internal Medicine

## 2016-10-03 DIAGNOSIS — H401132 Primary open-angle glaucoma, bilateral, moderate stage: Secondary | ICD-10-CM | POA: Diagnosis not present

## 2016-11-14 ENCOUNTER — Telehealth: Payer: Self-pay | Admitting: Internal Medicine

## 2016-11-14 NOTE — Telephone Encounter (Signed)
Returned call to patient left message on personal voice mail office currently out of Eliquis samples.Advised she can call back the end of this week if still needs samples.

## 2016-11-14 NOTE — Telephone Encounter (Signed)
New Message     Patient calling the office for samples of medication:   1.  What medication and dosage are you requesting samples for? Eliquis  5mg   3 month supply 2.  Are you currently out of this medication?  No , needs to pick them up in March

## 2016-11-16 ENCOUNTER — Telehealth: Payer: Self-pay | Admitting: Internal Medicine

## 2016-11-16 NOTE — Telephone Encounter (Signed)
Returned call, no answer, left message and notified samples are avaliable for her to pick up.  Patient also advised she is due for follow up appointment, request patient return call to get appt set up.    Medication samples have been provided to the patient.  Drug name: Eliquis 5mg   Qty: 14 LOT: [id number]  Exp.Date: 3/20  Silverio Lay 1:53 PM 11/16/2016

## 2016-11-16 NOTE — Telephone Encounter (Signed)
New Message     Patient calling the office for samples of medication:   1.  What medication and dosage are you requesting samples for? Eliquis  2.  Are you currently out of this medication?  No    She was told to call back today to see if they samples came in ?  Leave message if you they are there for her to pick up

## 2016-11-22 ENCOUNTER — Telehealth: Payer: Self-pay | Admitting: Internal Medicine

## 2016-11-22 MED ORDER — APIXABAN 5 MG PO TABS: 5.0000 mg | ORAL_TABLET | Freq: Two times a day (BID) | ORAL | 4 refills | Status: DC

## 2016-11-22 NOTE — Telephone Encounter (Signed)
LM for patient that patient assistance application will be mailed to her.

## 2016-11-22 NOTE — Telephone Encounter (Signed)
[place] patient assistance paperwork completed + signed Rx for assistance w/eliquis.

## 2016-11-22 NOTE — Telephone Encounter (Signed)
[facility] (f) [phone]  (p) [phone]  M-F [id number]

## 2016-12-12 DIAGNOSIS — H401132 Primary open-angle glaucoma, bilateral, moderate stage: Secondary | ICD-10-CM | POA: Diagnosis not present

## 2016-12-13 ENCOUNTER — Encounter: Payer: Self-pay | Admitting: Internal Medicine

## 2016-12-13 ENCOUNTER — Ambulatory Visit (INDEPENDENT_AMBULATORY_CARE_PROVIDER_SITE_OTHER): Payer: Medicare Other | Admitting: Internal Medicine

## 2016-12-13 VITALS — BP 127/61 | HR 76 | Ht 63.0 in | Wt 125.4 lb

## 2016-12-13 DIAGNOSIS — I4891 Unspecified atrial fibrillation: Secondary | ICD-10-CM

## 2016-12-13 DIAGNOSIS — I10 Essential (primary) hypertension: Secondary | ICD-10-CM | POA: Diagnosis not present

## 2016-12-13 DIAGNOSIS — Z7901 Long term (current) use of anticoagulants: Secondary | ICD-10-CM | POA: Insufficient documentation

## 2016-12-13 NOTE — Patient Instructions (Addendum)
Your physician wants you to follow-up in: 12 months with Dr. Hilty. You will receive a reminder letter in the mail two months in advance. If you don't receive a letter, please call our office to schedule the follow-up appointment.  

## 2016-12-13 NOTE — Progress Notes (Signed)
OFFICE NOTE  Chief Complaint:  No complaints  Primary Care Physician: Leonard Downing, MD  HPI:  Daisy Williams is a pleasant 83 year old female who presented about 10 days ago with nausea, vomiting and dizziness as well as weakness. This is fairly acute onset and she was noted on admission to be in A. fib with controlled ventricular response. This is a new diagnosis for her. She denies any chest pain. She does have probable hypertension although does not carry a history of that was on few medications. Her CHADSVASC score at least 3 if not higher. There was a possible concern for posterior circulation stroke with her dizziness and nausea related to A. fib. CT and MRI were negative for stroke. Echo was performed which showed EF of 60%, mild AI, moderate biatrial enlargement. It was recommended that she start on Eliquis 5 mg twice a day. She was not placed on rate control due to her IVCD. Heart rate today however is greater than 100. Overall she feels great. However, she remains in atrial fibrillation.  Mrs. Daisy Williams returns today for follow-up. Her EKG demonstrates atrial fibrillation at a rate of 79. She continues on Eliquis.  I saw Ms. Daisy Williams back in the office today. She continues to be rate controlled and in atrial fibrillation with an incomplete right bundle branch block. She reports being asymptomatic with her A. fib. We further discussed cardioversion and she is not interested at this time. I don't think there is can that Pilling evidence at this point since she is asymptomatic to push her toward a cardioversion. She does understand if she remains in A. fib for a long period of time this may become permanent. She seems to be tolerating Eliquis without any bleeding problems.  Mrs. Daisy Williams returns today for follow-up. She is without complaints. She denies any palpitations or knowledge of atrial fibrillation however her EKG shows persistent atrial fibrillation today with incomplete  right bundle branch block at 74. This is unchanged from prior studies. She is taking Eliquis without any bleeding complications. She is on low-dose Toprol and blood pressure is high normal.  04/25/2016  Daisy Williams was seen back today in the office for follow-up. She remains in persistent A. fib with controlled ventricular response. She's had no bleeding, occasions on Eliquis. Blood pressures been well controlled and she remains asymptomatic.  12/13/2016  Mrs. Daisy Williams was seen today in follow-up. She remains in long-standing persistent if not permanent A. fib with rate control on metoprolol. She is anticoagulated on Eliquis for CHADSVASC score 4. She denies any bleeding problems. Blood pressure is well-controlled. She is asymptomatic with her A. fib. Overall she seems to be doing well.  PMHx:  Past Medical History:  Diagnosis Date  . Glaucoma   . Hematuria    Patient reports PCP has followed, improved since using cranberry juice  . Inner ear dysfunction   . Melanoma ([place])    Skin cancer removal    No past surgical history on file.  FAMHx:  Family History  Problem Relation Age of Onset  . Colon cancer Mother   . Heart disease Father     Father passed away at 79. Was taking heart medicine - "didn't last a month after they put him on it."  . Heart disease Brother     Brother was on heart medicine. He went off his heart medicine and died shortly after - age 73. (heart attack?)  . Other Sister     Sister was a nursing home patient  and passed away at 52 of unknown cause.    SOCHx:   reports that she has never smoked. She has never used smokeless tobacco. She reports that she does not drink alcohol or use drugs.  ALLERGIES:  No Known Allergies  ROS: Pertinent items noted in HPI and remainder of comprehensive ROS otherwise negative.  HOME MEDS: Current Outpatient Prescriptions  Medication Sig Dispense Refill  . apixaban (ELIQUIS) 5 MG TABS tablet Take 1 tablet (5 mg total) by mouth 2  (two) times daily. 180 tablet 4  . brimonidine (ALPHAGAN) 0.2 % ophthalmic solution Place 1 drop into the right eye 2 (two) times daily.    Marland Kitchen CALCIUM-VITAMIN D PO Take 2 tablets by mouth daily.    . Cyanocobalamin (VITAMIN B 12 PO) Take 1 tablet by mouth daily.    Marland Kitchen latanoprost (XALATAN) 0.005 % ophthalmic solution Place 1 drop into both eyes at bedtime.    . metoprolol tartrate (LOPRESSOR) 25 MG tablet TAKE 1/2 TABLET BY MOUTH 2 TIMES A DAY. 90 tablet 1  . timolol (TIMOPTIC) 0.5 % ophthalmic solution Place 1 drop into both eyes 2 (two) times daily.  11   No current facility-administered medications for this visit.     LABS/IMAGING: No results found for this or any previous visit (from the past 48 hour(s)). No results found.  WEIGHTS: Wt Readings from Last 3 Encounters:  12/13/16 125 lb 6.4 oz (56.9 kg)  04/25/16 126 lb (57.2 kg)  10/22/15 127 lb 4.8 oz (57.7 kg)    VITALS: BP 127/61   Pulse 76   Ht 5\' 3"  (1.6 m)   Wt 125 lb 6.4 oz (56.9 kg)   BMI 22.21 kg/m   EXAM: General appearance: alert and no distress Neck: no carotid bruit and no JVD Lungs: clear to auscultation bilaterally Heart: irregularly irregular rhythm Abdomen: soft, non-tender; bowel sounds normal; no masses,  no organomegaly Extremities: extremities normal, atraumatic, no cyanosis or edema Pulses: 2+ and symmetric Skin: Skin color, texture, turgor normal. No rashes or lesions Neurologic: Grossly normal Psych: Pleasant  EKG: A. fib at 76  ASSESSMENT: 1. Persistent atrial fibrillation with CVR 2. Normal LV function with moderate biatrial enlargement 3. Hypertension - controlled 4. CHADSVASC 4 on Eliquis  PLAN: 1.   Ms. Daisy Williams is appropriately anticoagulated and rate controlled. She reports being asymptomatic with her A. Fib. She was approved for another 1 year of Eliquis.  Plan to see her back annually or sooner as necessary.  Pixie Casino, MD, [facility] Attending Cardiologist [place] C Della Homan 12/13/2016, 11:20 AM

## 2017-02-13 DIAGNOSIS — H401132 Primary open-angle glaucoma, bilateral, moderate stage: Secondary | ICD-10-CM | POA: Diagnosis not present

## 2017-03-27 ENCOUNTER — Other Ambulatory Visit: Payer: Self-pay | Admitting: Internal Medicine

## 2017-04-19 DIAGNOSIS — H401132 Primary open-angle glaucoma, bilateral, moderate stage: Secondary | ICD-10-CM | POA: Diagnosis not present

## 2017-06-26 DIAGNOSIS — Z23 Encounter for immunization: Secondary | ICD-10-CM | POA: Diagnosis not present

## 2017-08-02 ENCOUNTER — Telehealth: Payer: Self-pay | Admitting: Internal Medicine

## 2017-08-02 NOTE — Telephone Encounter (Signed)
Called patient regarding eliquis patient assistance thru [employer] - renewal is needed before 09/25/17. She would like the application mailed to her. She is aware of the info she will need to provide. Advised she can mail back to our office or drop off application.

## 2017-08-10 NOTE — Telephone Encounter (Signed)
Patient dropped of completed eliquis patient assistance application. Faxed application to [phone].

## 2017-08-28 DIAGNOSIS — H401132 Primary open-angle glaucoma, bilateral, moderate stage: Secondary | ICD-10-CM | POA: Diagnosis not present

## 2017-09-27 ENCOUNTER — Ambulatory Visit (INDEPENDENT_AMBULATORY_CARE_PROVIDER_SITE_OTHER): Payer: Medicare Other | Admitting: Internal Medicine

## 2017-09-27 ENCOUNTER — Encounter: Payer: Self-pay | Admitting: Internal Medicine

## 2017-09-27 VITALS — BP 130/57 | HR 74 | Ht 63.0 in | Wt 122.2 lb

## 2017-09-27 DIAGNOSIS — I481 Persistent atrial fibrillation: Secondary | ICD-10-CM | POA: Diagnosis not present

## 2017-09-27 DIAGNOSIS — I10 Essential (primary) hypertension: Secondary | ICD-10-CM

## 2017-09-27 DIAGNOSIS — I4819 Other persistent atrial fibrillation: Secondary | ICD-10-CM | POA: Insufficient documentation

## 2017-09-27 DIAGNOSIS — I4510 Unspecified right bundle-branch block: Secondary | ICD-10-CM

## 2017-09-27 MED ORDER — METOPROLOL TARTRATE 25 MG PO TABS: ORAL_TABLET | ORAL | 3 refills | Status: DC

## 2017-09-27 NOTE — Progress Notes (Signed)
OFFICE NOTE  Chief Complaint:  No complaints   Primary Care Physician: Leonard Downing, MD  HPI:  Daisy Williams is a pleasant 84 year old female who presented about 10 days ago with nausea, vomiting and dizziness as well as weakness. This is fairly acute onset and she was noted on admission to be in A. fib with controlled ventricular response. This is a new diagnosis for her. She denies any chest pain. She does have probable hypertension although does not carry a history of that was on few medications. Her CHADSVASC score at least 3 if not higher. There was a possible concern for posterior circulation stroke with her dizziness and nausea related to A. fib. CT and MRI were negative for stroke. Echo was performed which showed EF of 60%, mild AI, moderate biatrial enlargement. It was recommended that she start on Eliquis 5 mg twice a day. She was not placed on rate control due to her IVCD. Heart rate today however is greater than 100. Overall she feels great. However, she remains in atrial fibrillation.  Daisy Williams returns today for follow-up. Her EKG demonstrates atrial fibrillation at a rate of 79. She continues on Eliquis.  I saw Daisy Williams back in the office today. She continues to be rate controlled and in atrial fibrillation with an incomplete right bundle branch block. She reports being asymptomatic with her A. fib. We further discussed cardioversion and she is not interested at this time. I don't think there is can that Pilling evidence at this point since she is asymptomatic to push her toward a cardioversion. She does understand if she remains in A. fib for a long period of time this may become permanent. She seems to be tolerating Eliquis without any bleeding problems.  Daisy Williams returns today for follow-up. She is without complaints. She denies any palpitations or knowledge of atrial fibrillation however her EKG shows persistent atrial fibrillation today with incomplete  right bundle branch block at 74. This is unchanged from prior studies. She is taking Eliquis without any bleeding complications. She is on low-dose Toprol and blood pressure is high normal.  04/25/2016  Daisy Williams was seen back today in the office for follow-up. She remains in persistent A. fib with controlled ventricular response. She's had no bleeding, occasions on Eliquis. Blood pressures been well controlled and she remains asymptomatic.  12/13/2016  Daisy Williams was seen today in follow-up. She remains in long-standing persistent if not permanent A. fib with rate control on metoprolol. She is anticoagulated on Eliquis for CHADSVASC score 4. She denies any bleeding problems. Blood pressure is well-controlled. She is asymptomatic with her A. fib. Overall she seems to be doing well.  09/27/2017  Daisy Williams was seen today for annual follow-up.  She denies any chest pain or worsening shortness of breath.  She is in need of a refill of her metoprolol.  Her permanent A. fib is rate controlled.  She denies any bleeding problems on Eliquis.  PMHx:  Past Medical History:  Diagnosis Date  . Glaucoma   . Hematuria    Patient reports PCP has followed, improved since using cranberry juice  . Inner ear dysfunction   . Melanoma ([place])    Skin cancer removal    No past surgical history on file.  FAMHx:  Family History  Problem Relation Age of Onset  . Colon cancer Mother   . Heart disease Father        Father passed away at 73. Was taking heart medicine - "  didn't last a month after they put him on it."  . Heart disease Brother        Brother was on heart medicine. He went off his heart medicine and died shortly after - age 38. (heart attack?)  . Other Sister        Sister was a nursing home patient and passed away at 26 of unknown cause.    SOCHx:   reports that  has never smoked. she has never used smokeless tobacco. She reports that she does not drink alcohol or use drugs.  ALLERGIES:  No  Known Allergies  ROS: Pertinent items noted in HPI and remainder of comprehensive ROS otherwise negative.  HOME MEDS: Current Outpatient Medications  Medication Sig Dispense Refill  . apixaban (ELIQUIS) 5 MG TABS tablet Take 1 tablet (5 mg total) by mouth 2 (two) times daily. 180 tablet 4  . CALCIUM-VITAMIN D PO Take 2 tablets by mouth daily.    . Cyanocobalamin (VITAMIN B 12 PO) Take 1 tablet by mouth daily.    Marland Kitchen latanoprost (XALATAN) 0.005 % ophthalmic solution Place 1 drop into both eyes at bedtime.    . metoprolol tartrate (LOPRESSOR) 25 MG tablet TAKE 1/2 TABLET BY MOUTH 2 TIMES A DAY. 90 tablet 3  . timolol (TIMOPTIC) 0.5 % ophthalmic solution Place 1 drop into both eyes 2 (two) times daily.  11  . dorzolamide-timolol (COSOPT) 22.3-6.8 MG/ML ophthalmic solution   11   No current facility-administered medications for this visit.     LABS/IMAGING: No results found for this or any previous visit (from the past 48 hour(s)). No results found.  WEIGHTS: Wt Readings from Last 3 Encounters:  09/27/17 122 lb 3.2 oz (55.4 kg)  12/13/16 125 lb 6.4 oz (56.9 kg)  04/25/16 126 lb (57.2 kg)    VITALS: BP (!) 130/57   Pulse 74   Ht 5\' 3"  (1.6 m)   Wt 122 lb 3.2 oz (55.4 kg)   BMI 21.65 kg/m   EXAM: General appearance: alert and no distress Neck: no carotid bruit and no JVD Lungs: clear to auscultation bilaterally Heart: irregularly irregular rhythm Abdomen: soft, non-tender; bowel sounds normal; no masses,  no organomegaly Extremities: extremities normal, atraumatic, no cyanosis or edema Pulses: 2+ and symmetric Skin: Skin color, texture, turgor normal. No rashes or lesions Neurologic: Grossly normal Psych: Pleasant  EKG: Atrial fibrillation with right bundle branch block at 74-personally reviewed  ASSESSMENT: 1. Persistent atrial fibrillation with CVR 2. Normal LV function with moderate biatrial enlargement 3. Hypertension - controlled 4. CHADSVASC 4 on  Eliquis 5. RBBB  PLAN: 1.   Ms. Williams is to be asymptomatic and has rate controlled A. fib.  She denies any bleeding problems on Eliquis.  She has had no falls or other concerns about being on anticoagulation.  Hypertension is well controlled.  There is a right bundle branch block on EKG today.  No further workup is necessary at this time and she is asymptomatic.  Follow-up with me annually or sooner as necessary.  Pixie Casino, MD, [facility], [place] Director of the [facility] Attending Cardiologist  Direct Dial: [phone]  Fax: [phone]  Website:  [email or web address] 09/27/2017, 10:27 AM

## 2017-09-27 NOTE — Patient Instructions (Signed)
Your physician wants you to follow-up in: ONE YEAR with Dr. Hilty. You will receive a reminder letter in the mail two months in advance. If you don't receive a letter, please call our office to schedule the follow-up appointment.  

## 2017-10-02 DIAGNOSIS — H401132 Primary open-angle glaucoma, bilateral, moderate stage: Secondary | ICD-10-CM | POA: Diagnosis not present

## 2018-01-29 DIAGNOSIS — H401132 Primary open-angle glaucoma, bilateral, moderate stage: Secondary | ICD-10-CM | POA: Diagnosis not present

## 2018-03-07 DIAGNOSIS — H401132 Primary open-angle glaucoma, bilateral, moderate stage: Secondary | ICD-10-CM | POA: Diagnosis not present

## 2018-04-02 DIAGNOSIS — H401132 Primary open-angle glaucoma, bilateral, moderate stage: Secondary | ICD-10-CM | POA: Diagnosis not present

## 2018-06-25 DIAGNOSIS — Z23 Encounter for immunization: Secondary | ICD-10-CM | POA: Diagnosis not present

## 2018-07-16 ENCOUNTER — Telehealth (HOSPITAL_COMMUNITY): Payer: Self-pay | Admitting: Internal Medicine

## 2018-07-16 NOTE — Telephone Encounter (Signed)
Will forward to our [place] office.

## 2018-07-16 NOTE — Telephone Encounter (Signed)
Returned call to patient. She has been getting Eliquis from [employer] patient assistance foundation. Advised that a new application will need to be submitted. I will print one for patient and mail to her. She was instructed to return to our office to be submitted

## 2018-07-16 NOTE — Telephone Encounter (Signed)
New message   Patient has questions about getting Eliquis. Patient states that she receives this medication for free. She feels that she may run out before her Appt with Dr. Debara Pickett in January. Please call to discuss.

## 2018-08-01 ENCOUNTER — Telehealth: Payer: Self-pay | Admitting: Internal Medicine

## 2018-08-01 NOTE — Telephone Encounter (Addendum)
Faxed Eliquis patient assistance application for [phone] to [place] @ [phone]

## 2018-08-22 ENCOUNTER — Other Ambulatory Visit: Payer: Self-pay | Admitting: Internal Medicine

## 2018-08-28 ENCOUNTER — Other Ambulatory Visit: Payer: Self-pay | Admitting: Internal Medicine

## 2018-08-28 MED ORDER — APIXABAN 5 MG PO TABS: 5.0000 mg | ORAL_TABLET | Freq: Two times a day (BID) | ORAL | 3 refills | Status: DC

## 2018-08-28 NOTE — Telephone Encounter (Signed)
Received fax request for Eliquis refill from [facility]. Rx(s) sent to pharmacy electronically.

## 2018-09-20 ENCOUNTER — Other Ambulatory Visit: Payer: Self-pay | Admitting: Internal Medicine

## 2018-09-20 DIAGNOSIS — I4819 Other persistent atrial fibrillation: Secondary | ICD-10-CM

## 2018-09-21 DIAGNOSIS — H401132 Primary open-angle glaucoma, bilateral, moderate stage: Secondary | ICD-10-CM | POA: Diagnosis not present

## 2018-10-09 ENCOUNTER — Encounter: Payer: Self-pay | Admitting: Internal Medicine

## 2018-10-09 ENCOUNTER — Ambulatory Visit (INDEPENDENT_AMBULATORY_CARE_PROVIDER_SITE_OTHER): Payer: Medicare Other | Admitting: Internal Medicine

## 2018-10-09 VITALS — BP 116/60 | HR 76 | Ht 63.0 in | Wt 123.0 lb

## 2018-10-09 DIAGNOSIS — I4819 Other persistent atrial fibrillation: Secondary | ICD-10-CM | POA: Diagnosis not present

## 2018-10-09 DIAGNOSIS — I4510 Unspecified right bundle-branch block: Secondary | ICD-10-CM

## 2018-10-09 DIAGNOSIS — I10 Essential (primary) hypertension: Secondary | ICD-10-CM | POA: Diagnosis not present

## 2018-10-09 NOTE — Patient Instructions (Signed)
Medication Instructions:  DECREASE eliquis to 2.5mg  twice daily If you need a refill on your cardiac medications before your next appointment, please call your pharmacy.    Follow-Up: At [facility], you and your health needs are our priority.  As part of our continuing mission to provide you with exceptional heart care, we have created designated Provider Care Teams.  These Care Teams include your primary Cardiologist (physician) and Advanced Practice Providers (APPs -  Physician Assistants and Nurse Practitioners) who all work together to provide you with the care you need, when you need it. You will need a follow up appointment in 12 months.  Please call our office 2 months in advance to schedule this appointment.  You may see Pixie Casino, MD or one of the following Advanced Practice Providers on your designated Care Team: [place], [place] . Fabian Sharp, PA-C  Any Other Special Instructions Will Be Listed Below (If Applicable).

## 2018-10-09 NOTE — Progress Notes (Signed)
OFFICE NOTE  Chief Complaint:  No complaints   Primary Care Physician: Leonard Downing, MD  HPI:  Daisy Williams is a pleasant 85 year old female who presented about 10 days ago with nausea, vomiting and dizziness as well as weakness. This is fairly acute onset and she was noted on admission to be in A. fib with controlled ventricular response. This is a new diagnosis for her. She denies any chest pain. She does have probable hypertension although does not carry a history of that was on few medications. Her CHADSVASC score at least 3 if not higher. There was a possible concern for posterior circulation stroke with her dizziness and nausea related to A. fib. CT and MRI were negative for stroke. Echo was performed which showed EF of 60%, mild AI, moderate biatrial enlargement. It was recommended that she start on Eliquis 5 mg twice a day. She was not placed on rate control due to her IVCD. Heart rate today however is greater than 100. Overall she feels great. However, she remains in atrial fibrillation.  Daisy Williams returns today for follow-up. Her EKG demonstrates atrial fibrillation at a rate of 79. She continues on Eliquis.  I saw Daisy Williams back in the office today. She continues to be rate controlled and in atrial fibrillation with an incomplete right bundle branch block. She reports being asymptomatic with her A. fib. We further discussed cardioversion and she is not interested at this time. I don't think there is can that Pilling evidence at this point since she is asymptomatic to push her toward a cardioversion. She does understand if she remains in A. fib for a long period of time this may become permanent. She seems to be tolerating Eliquis without any bleeding problems.  Daisy Williams returns today for follow-up. She is without complaints. She denies any palpitations or knowledge of atrial fibrillation however her EKG shows persistent atrial fibrillation today with incomplete  right bundle branch block at 74. This is unchanged from prior studies. She is taking Eliquis without any bleeding complications. She is on low-dose Toprol and blood pressure is high normal.  04/25/2016  Daisy Williams was seen back today in the office for follow-up. She remains in persistent A. fib with controlled ventricular response. She's had no bleeding, occasions on Eliquis. Blood pressures been well controlled and she remains asymptomatic.  12/13/2016  Daisy Williams was seen today in follow-up. She remains in long-standing persistent if not permanent A. fib with rate control on metoprolol. She is anticoagulated on Eliquis for CHADSVASC score 4. She denies any bleeding problems. Blood pressure is well-controlled. She is asymptomatic with her A. fib. Overall she seems to be doing well.  09/27/2017  Daisy Williams was seen today for annual follow-up.  She denies any chest pain or worsening shortness of breath.  She is in need of a refill of her metoprolol.  Her permanent A. fib is rate controlled.  She denies any bleeding problems on Eliquis.  10/09/2018  Daisy Williams is seen today in follow-up.  It is been a year since I last saw her.  She denies any symptoms related to her A. fib.  Recently she was notified that she will qualify for a reduced dose of Eliquis.  Her age is 54 and weight is less than 60 kg.  I do not anticipate her gaining any significant weight to be above that in the near future.  I would agree with this dose adjustment.  PMHx:  Past Medical History:  Diagnosis  Date  . Glaucoma   . Hematuria    Patient reports PCP has followed, improved since using cranberry juice  . Inner ear dysfunction   . Melanoma ([place])    Skin cancer removal    History reviewed. No pertinent surgical history.  FAMHx:  Family History  Problem Relation Age of Onset  . Colon cancer Mother   . Heart disease Father        Father passed away at 16. Was taking heart medicine - "didn't last a month after they  put him on it."  . Heart disease Brother        Brother was on heart medicine. He went off his heart medicine and died shortly after - age 9. (heart attack?)  . Other Sister        Sister was a nursing home patient and passed away at 23 of unknown cause.    SOCHx:   reports that she has never smoked. She has never used smokeless tobacco. She reports that she does not drink alcohol or use drugs.  ALLERGIES:  No Known Allergies  ROS: Pertinent items noted in HPI and remainder of comprehensive ROS otherwise negative.  HOME MEDS: Current Outpatient Medications  Medication Sig Dispense Refill  . apixaban (ELIQUIS) 5 MG TABS tablet Take 1 tablet (5 mg total) by mouth 2 (two) times daily. 180 tablet 3  . CALCIUM-VITAMIN D PO Take 2 tablets by mouth daily.    . Cyanocobalamin (VITAMIN B 12 PO) Take 1 tablet by mouth daily.    . dorzolamide-timolol (COSOPT) 22.3-6.8 MG/ML ophthalmic solution Place 1 drop into both eyes 2 (two) times daily.   11  . latanoprost (XALATAN) 0.005 % ophthalmic solution Place 1 drop into both eyes at bedtime.    . metoprolol tartrate (LOPRESSOR) 25 MG tablet TAKE 1/2 TABLET BY MOUTH 2 TIMES A DAY. Please keep upcoming appt for future refills. Thank you. 90 tablet 0  . timolol (TIMOPTIC) 0.5 % ophthalmic solution Place 1 drop into both eyes 2 (two) times daily.  11   No current facility-administered medications for this visit.     LABS/IMAGING: No results found for this or any previous visit (from the past 48 hour(s)). No results found.  WEIGHTS: Wt Readings from Last 3 Encounters:  10/09/18 123 lb (55.8 kg)  09/27/17 122 lb 3.2 oz (55.4 kg)  12/13/16 125 lb 6.4 oz (56.9 kg)    VITALS: BP 116/60 (BP Location: Right Arm, Patient Position: Sitting, Cuff Size: Normal)   Pulse 76   Ht 5\' 3"  (1.6 m)   Wt 123 lb (55.8 kg)   BMI 21.79 kg/m   EXAM: General appearance: alert and no distress Neck: no carotid bruit and no JVD Lungs: clear to auscultation  bilaterally Heart: irregularly irregular rhythm Abdomen: soft, non-tender; bowel sounds normal; no masses,  no organomegaly Extremities: extremities normal, atraumatic, no cyanosis or edema Pulses: 2+ and symmetric Skin: Skin color, texture, turgor normal. No rashes or lesions Neurologic: Grossly normal Psych: Pleasant  EKG: A. fib with at 76, incomplete right bundle branch block, inferior and anterolateral T wave changes at 76-personally reviewed  ASSESSMENT: 1. Persistent atrial fibrillation with CVR 2. Normal LV function with moderate biatrial enlargement 3. Hypertension - controlled 4. CHADSVASC 4 on Eliquis 5. RBBB  PLAN: 1.   Ms. Vaness continues to be asymptomatic with her A. fib which is rate controlled.  Her blood pressure is also at goal.  She is tolerating Eliquis however given her weight  less than 60 kg and age over 61 she would qualify for reduced dose 2.5 mg twice daily Eliquis.  We will change her order over to that and I encouraged her to break her current tablets in half (with a pill cutter, not chewing them in half).  Follow-up with me annually or sooner as necessary.  Pixie Casino, MD, [facility], [place] Director of the Advanced Lipid Disorders &  Cardiovascular Risk Reduction Clinic Attending Cardiologist  Direct Dial: [phone]  Fax: [phone]  Website:  [email or web address] 10/09/2018, 2:18 PM

## 2018-10-11 ENCOUNTER — Telehealth: Payer: Self-pay | Admitting: Internal Medicine

## 2018-10-11 NOTE — Telephone Encounter (Signed)
Per Oct 09, 2018 visit, patient should be changed to eliquis 2.5mg  BID instead of eliquis 5mg  BID as our records previously indicated. [place] to notify them of dose change. Spoke with [facility] pharmacy staff to provide update on dose for patient. Provided verbal Rx for eliquis 2.5mg  BID  Patient has been approved for Eliquis for calendar year 2020.

## 2018-12-17 ENCOUNTER — Other Ambulatory Visit: Payer: Self-pay | Admitting: Internal Medicine

## 2018-12-17 DIAGNOSIS — I4819 Other persistent atrial fibrillation: Secondary | ICD-10-CM

## 2019-02-21 DIAGNOSIS — H401132 Primary open-angle glaucoma, bilateral, moderate stage: Secondary | ICD-10-CM | POA: Diagnosis not present

## 2019-03-25 DIAGNOSIS — H401132 Primary open-angle glaucoma, bilateral, moderate stage: Secondary | ICD-10-CM | POA: Diagnosis not present

## 2019-06-27 DIAGNOSIS — Z23 Encounter for immunization: Secondary | ICD-10-CM | POA: Diagnosis not present

## 2019-07-31 ENCOUNTER — Telehealth: Payer: Self-pay | Admitting: Internal Medicine

## 2019-07-31 NOTE — Telephone Encounter (Signed)
eliquis patient assistance application mailed with instructions to return & compelte

## 2019-08-07 NOTE — Telephone Encounter (Signed)
eliquis patient assistance application faxed to [place] @ [phone] for 2021

## 2019-08-30 DIAGNOSIS — H25043 Posterior subcapsular polar age-related cataract, bilateral: Secondary | ICD-10-CM | POA: Diagnosis not present

## 2019-08-30 DIAGNOSIS — H5203 Hypermetropia, bilateral: Secondary | ICD-10-CM | POA: Diagnosis not present

## 2019-08-30 DIAGNOSIS — H0100A Unspecified blepharitis right eye, upper and lower eyelids: Secondary | ICD-10-CM | POA: Diagnosis not present

## 2019-08-30 DIAGNOSIS — H2513 Age-related nuclear cataract, bilateral: Secondary | ICD-10-CM | POA: Diagnosis not present

## 2019-10-03 DIAGNOSIS — H2511 Age-related nuclear cataract, right eye: Secondary | ICD-10-CM | POA: Diagnosis not present

## 2019-10-03 DIAGNOSIS — H25811 Combined forms of age-related cataract, right eye: Secondary | ICD-10-CM | POA: Diagnosis not present

## 2019-10-03 DIAGNOSIS — H25011 Cortical age-related cataract, right eye: Secondary | ICD-10-CM | POA: Diagnosis not present

## 2019-10-03 DIAGNOSIS — H25041 Posterior subcapsular polar age-related cataract, right eye: Secondary | ICD-10-CM | POA: Diagnosis not present

## 2019-10-07 NOTE — Telephone Encounter (Signed)
Patient approved for eliquis assistance until 09/25/2020

## 2019-10-09 ENCOUNTER — Ambulatory Visit (INDEPENDENT_AMBULATORY_CARE_PROVIDER_SITE_OTHER): Payer: Medicare Other | Admitting: Internal Medicine

## 2019-10-09 ENCOUNTER — Encounter: Payer: Self-pay | Admitting: Internal Medicine

## 2019-10-09 ENCOUNTER — Other Ambulatory Visit: Payer: Self-pay

## 2019-10-09 VITALS — BP 144/66 | HR 79 | Ht 63.0 in | Wt 118.8 lb

## 2019-10-09 DIAGNOSIS — I10 Essential (primary) hypertension: Secondary | ICD-10-CM

## 2019-10-09 DIAGNOSIS — I4510 Unspecified right bundle-branch block: Secondary | ICD-10-CM

## 2019-10-09 DIAGNOSIS — I4819 Other persistent atrial fibrillation: Secondary | ICD-10-CM | POA: Diagnosis not present

## 2019-10-09 NOTE — Patient Instructions (Signed)
Medication Instructions:  Your physician recommends that you continue on your current medications as directed. Please refer to the Current Medication list given to you today.  *If you need a refill on your cardiac medications before your next appointment, please call your pharmacy*  Follow-Up: At [facility], you and your health needs are our priority.  As part of our continuing mission to provide you with exceptional heart care, we have created designated Provider Care Teams.  These Care Teams include your primary Cardiologist (physician) and Advanced Practice Providers (APPs -  Physician Assistants and Nurse Practitioners) who all work together to provide you with the care you need, when you need it.  Your next appointment:   12 month(s)  The format for your next appointment:   In Person  Provider:   You may see Kenneth C Hilty, MD or one of the following Advanced Practice Providers on your designated Care Team:    Hao Meng, PA-C  Angela Duke, PA-C or   Krista Kroeger, PA-C   Other Instructions   

## 2019-10-09 NOTE — Progress Notes (Signed)
OFFICE NOTE  Chief Complaint:  No complaints   Primary Care Physician: Leonard Downing, MD  HPI:  Daisy Williams is a pleasant 86 year old female who presented about 10 days ago with nausea, vomiting and dizziness as well as weakness. This is fairly acute onset and she was noted on admission to be in A. fib with controlled ventricular response. This is a new diagnosis for her. She denies any chest pain. She does have probable hypertension although does not carry a history of that was on few medications. Her CHADSVASC score at least 3 if not higher. There was a possible concern for posterior circulation stroke with her dizziness and nausea related to A. fib. CT and MRI were negative for stroke. Echo was performed which showed EF of 60%, mild AI, moderate biatrial enlargement. It was recommended that she start on Eliquis 5 mg twice a day. She was not placed on rate control due to her IVCD. Heart rate today however is greater than 100. Overall she feels great. However, she remains in atrial fibrillation.  Daisy Williams returns today for follow-up. Her EKG demonstrates atrial fibrillation at a rate of 79. She continues on Eliquis.  I saw Daisy Williams back in the office today. She continues to be rate controlled and in atrial fibrillation with an incomplete right bundle branch block. She reports being asymptomatic with her A. fib. We further discussed cardioversion and she is not interested at this time. I don't think there is can that Pilling evidence at this point since she is asymptomatic to push her toward a cardioversion. She does understand if she remains in A. fib for a long period of time this may become permanent. She seems to be tolerating Eliquis without any bleeding problems.  Daisy Williams returns today for follow-up. She is without complaints. She denies any palpitations or knowledge of atrial fibrillation however her EKG shows persistent atrial fibrillation today with incomplete  right bundle branch block at 74. This is unchanged from prior studies. She is taking Eliquis without any bleeding complications. She is on low-dose Toprol and blood pressure is high normal.  04/25/2016  Daisy Williams was seen back today in the office for follow-up. She remains in persistent A. fib with controlled ventricular response. She's had no bleeding, occasions on Eliquis. Blood pressures been well controlled and she remains asymptomatic.  12/13/2016  Daisy Williams was seen today in follow-up. She remains in long-standing persistent if not permanent A. fib with rate control on metoprolol. She is anticoagulated on Eliquis for CHADSVASC score 4. She denies any bleeding problems. Blood pressure is well-controlled. She is asymptomatic with her A. fib. Overall she seems to be doing well.  09/27/2017  Daisy Williams was seen today for annual follow-up.  She denies any chest pain or worsening shortness of breath.  She is in need of a refill of her metoprolol.  Her permanent A. fib is rate controlled.  She denies any bleeding problems on Eliquis.  10/09/2018  Daisy Williams is seen today in follow-up.  It is been a year since I last saw her.  She denies any symptoms related to her A. fib.  Recently she was notified that she will qualify for a reduced dose of Eliquis.  Her age is 41 and weight is less than 60 kg.  I do not anticipate her gaining any significant weight to be above that in the near future.  I would agree with this dose adjustment.  10/09/2019  Daisy Williams returns today for  follow-up.  Overall she is doing well and she is asymptomatic.  She denies any chest pain or worsening shortness of breath.  She is on the lower dose apixaban due to age over 64 and weight less than 60 kg.  She thinks this is even better than her prior dose of medicine.  She continues on low-dose metoprolol.  She denies any symptomatic A. fib.  She is in A. fib however with rate control.  EKG shows some lateral T wave changes  which are stable and likely represent repolarization abnormality.  PMHx:  Past Medical History:  Diagnosis Date  . Glaucoma   . Hematuria    Patient reports PCP has followed, improved since using cranberry juice  . Inner ear dysfunction   . Melanoma ([place])    Skin cancer removal    No past surgical history on file.  FAMHx:  Family History  Problem Relation Age of Onset  . Colon cancer Mother   . Heart disease Father        Father passed away at 52. Was taking heart medicine - "didn't last a month after they put him on it."  . Heart disease Brother        Brother was on heart medicine. He went off his heart medicine and died shortly after - age 52. (heart attack?)  . Other Sister        Sister was a nursing home patient and passed away at 20 of unknown cause.    SOCHx:   reports that she has never smoked. She has never used smokeless tobacco. She reports that she does not drink alcohol or use drugs.  ALLERGIES:  No Known Allergies  ROS: Pertinent items noted in HPI and remainder of comprehensive ROS otherwise negative.  HOME MEDS: Current Outpatient Medications  Medication Sig Dispense Refill  . apixaban (ELIQUIS) 2.5 MG TABS tablet Take 2.5 mg by mouth 2 (two) times daily.    Mar[place] Kitchen CALCIUM-VITAMIN D PO Take 2 tablets by mouth daily.    . Cyanocobalamin (VITAMIN B 12 PO) Take 1 tablet by mouth daily.    . dorzolamide-timolol (COSOPT) 22.3-6.8 MG/ML ophthalmic solution Place 1 drop into both eyes 2 (two) times daily.   11  . latanoprost (XALATAN) 0.005 % ophthalmic solution Place 1 drop into both eyes at bedtime.    . metoprolol tartrate (LOPRESSOR) 25 MG tablet TAKE 1/2 TABLET BY MOUTH 2 TIMES A DAY. PLEASE KEEP UPCOMING APPT FOR FUTURE REFILLS. THANK YOU. 90 tablet 3  . timolol (TIMOPTIC) 0.5 % ophthalmic solution Place 1 drop into both eyes 2 (two) times daily.  11   No current facility-administered medications for this visit.    LABS/IMAGING: No results found for this  or any previous visit (from the past 48 hour(s)). No results found.  WEIGHTS: Wt Readings from Last 3 Encounters:  10/09/19 118 lb 12.8 oz (53.9 kg)  10/09/18 123 lb (55.8 kg)  09/27/17 122 lb 3.2 oz (55.4 kg)    VITALS: BP (!) 144/66   Pulse 79   Ht 5\' 3"  (1.6 m)   Wt 118 lb 12.8 oz (53.9 kg)   SpO2 96%   BMI 21.04 kg/m   EXAM: General appearance: alert and no distress Neck: no carotid bruit and no JVD Lungs: clear to auscultation bilaterally Heart: irregularly irregular rhythm Abdomen: soft, non-tender; bowel sounds normal; no masses,  no organomegaly Extremities: extremities normal, atraumatic, no cyanosis or edema Pulses: 2+ and symmetric Skin: Skin color, texture, turgor normal.  No rashes or lesions Neurologic: Grossly normal Psych: Pleasant  EKG: A. fib at 79, inferior and lateral T wave inversions, stable-personally reviewed  ASSESSMENT: 1. Persistent atrial fibrillation with CVR 2. Normal LV function with moderate biatrial enlargement 3. Hypertension - controlled 4. CHADSVASC 4 on Eliquis 5. RBBB  PLAN: 1.   Ms. Adams has longstanding persistent if not permanent A. fib with controlled ventricular response.  Her blood pressure has been well controlled.  She is on low-dose Eliquis due to weight and age constraints.  No changes to her medicines today.  Follow-up annually or sooner as necessary.  Pixie Casino, MD, [facility], [place] Director of the Advanced Lipid Disorders &  Cardiovascular Risk Reduction Clinic Attending Cardiologist  Direct Dial: [phone]  Fax: [phone]  Website:  [email or web address] 10/09/2019, 11:29 AM

## 2019-10-31 DIAGNOSIS — H25012 Cortical age-related cataract, left eye: Secondary | ICD-10-CM | POA: Diagnosis not present

## 2019-10-31 DIAGNOSIS — H2512 Age-related nuclear cataract, left eye: Secondary | ICD-10-CM | POA: Diagnosis not present

## 2019-10-31 DIAGNOSIS — H25042 Posterior subcapsular polar age-related cataract, left eye: Secondary | ICD-10-CM | POA: Diagnosis not present

## 2019-10-31 DIAGNOSIS — H25812 Combined forms of age-related cataract, left eye: Secondary | ICD-10-CM | POA: Diagnosis not present

## 2019-12-10 ENCOUNTER — Other Ambulatory Visit: Payer: Self-pay | Admitting: Internal Medicine

## 2019-12-10 DIAGNOSIS — I4819 Other persistent atrial fibrillation: Secondary | ICD-10-CM

## 2020-01-13 DIAGNOSIS — H401132 Primary open-angle glaucoma, bilateral, moderate stage: Secondary | ICD-10-CM | POA: Diagnosis not present

## 2020-06-17 DIAGNOSIS — H401132 Primary open-angle glaucoma, bilateral, moderate stage: Secondary | ICD-10-CM | POA: Diagnosis not present

## 2020-06-25 DIAGNOSIS — Z23 Encounter for immunization: Secondary | ICD-10-CM | POA: Diagnosis not present

## 2020-08-11 ENCOUNTER — Telehealth: Payer: Self-pay | Admitting: Internal Medicine

## 2020-08-11 NOTE — Telephone Encounter (Signed)
Mailed patient portion of eliquis assistance application to be completed & returned

## 2020-08-14 NOTE — Telephone Encounter (Signed)
Patient mailed MD her portion of Eliquis assistance application  This has been faxed to [place] @ [phone]

## 2020-09-07 DIAGNOSIS — H401132 Primary open-angle glaucoma, bilateral, moderate stage: Secondary | ICD-10-CM | POA: Diagnosis not present

## 2020-09-09 ENCOUNTER — Other Ambulatory Visit: Payer: Self-pay | Admitting: Internal Medicine

## 2020-09-09 MED ORDER — APIXABAN 2.5 MG PO TABS: 2.5000 mg | ORAL_TABLET | Freq: Two times a day (BID) | ORAL | 3 refills | Status: DC

## 2020-09-09 NOTE — Telephone Encounter (Signed)
Received fax from [facility] for refill for Eliquis 2.5mg  BID - assistance program pharmacy. Rx(s) sent to pharmacy electronically.

## 2020-10-08 ENCOUNTER — Ambulatory Visit: Payer: Medicare Other | Admitting: Internal Medicine

## 2020-10-26 DIAGNOSIS — H401132 Primary open-angle glaucoma, bilateral, moderate stage: Secondary | ICD-10-CM | POA: Diagnosis not present

## 2020-10-26 NOTE — Telephone Encounter (Signed)
Patient has been approved for Eliquis assistance until 09/25/2021

## 2020-12-08 ENCOUNTER — Encounter: Payer: Self-pay | Admitting: Internal Medicine

## 2020-12-08 ENCOUNTER — Other Ambulatory Visit: Payer: Self-pay

## 2020-12-08 ENCOUNTER — Ambulatory Visit (INDEPENDENT_AMBULATORY_CARE_PROVIDER_SITE_OTHER): Payer: Medicare Other | Admitting: Internal Medicine

## 2020-12-08 ENCOUNTER — Other Ambulatory Visit: Payer: Self-pay | Admitting: Internal Medicine

## 2020-12-08 VITALS — BP 114/58 | HR 56 | Ht 63.0 in | Wt 115.8 lb

## 2020-12-08 DIAGNOSIS — I4819 Other persistent atrial fibrillation: Secondary | ICD-10-CM

## 2020-12-08 DIAGNOSIS — I4821 Permanent atrial fibrillation: Secondary | ICD-10-CM | POA: Diagnosis not present

## 2020-12-08 DIAGNOSIS — Z7901 Long term (current) use of anticoagulants: Secondary | ICD-10-CM | POA: Diagnosis not present

## 2020-12-08 DIAGNOSIS — I4510 Unspecified right bundle-branch block: Secondary | ICD-10-CM | POA: Diagnosis not present

## 2020-12-08 NOTE — Progress Notes (Signed)
OFFICE NOTE  Chief Complaint:  No complaints   Primary Care Physician: Leonard Downing, MD  HPI:  Daisy Williams is a pleasant 87 year old female who presented about 10 days ago with nausea, vomiting and dizziness as well as weakness. This is fairly acute onset and she was noted on admission to be in A. fib with controlled ventricular response. This is a new diagnosis for her. She denies any chest pain. She does have probable hypertension although does not carry a history of that was on few medications. Her CHADSVASC score at least 3 if not higher. There was a possible concern for posterior circulation stroke with her dizziness and nausea related to A. fib. CT and MRI were negative for stroke. Echo was performed which showed EF of 60%, mild AI, moderate biatrial enlargement. It was recommended that she start on Eliquis 5 mg twice a day. She was not placed on rate control due to her IVCD. Heart rate today however is greater than 100. Overall she feels great. However, she remains in atrial fibrillation.  Daisy Williams returns today for follow-up. Her EKG demonstrates atrial fibrillation at a rate of 79. She continues on Eliquis.  I saw Daisy Williams back in the office today. She continues to be rate controlled and in atrial fibrillation with an incomplete right bundle branch block. She reports being asymptomatic with her A. fib. We further discussed cardioversion and she is not interested at this time. I don't think there is can that Pilling evidence at this point since she is asymptomatic to push her toward a cardioversion. She does understand if she remains in A. fib for a long period of time this may become permanent. She seems to be tolerating Eliquis without any bleeding problems.  Daisy Williams returns today for follow-up. She is without complaints. She denies any palpitations or knowledge of atrial fibrillation however her EKG shows persistent atrial fibrillation today with incomplete  right bundle branch block at 74. This is unchanged from prior studies. She is taking Eliquis without any bleeding complications. She is on low-dose Toprol and blood pressure is high normal.  04/25/2016  Daisy Williams was seen back today in the office for follow-up. She remains in persistent A. fib with controlled ventricular response. She's had no bleeding, occasions on Eliquis. Blood pressures been well controlled and she remains asymptomatic.  12/13/2016  Daisy Williams was seen today in follow-up. She remains in long-standing persistent if not permanent A. fib with rate control on metoprolol. She is anticoagulated on Eliquis for CHADSVASC score 4. She denies any bleeding problems. Blood pressure is well-controlled. She is asymptomatic with her A. fib. Overall she seems to be doing well.  09/27/2017  Daisy Williams was seen today for annual follow-up.  She denies any chest pain or worsening shortness of breath.  She is in need of a refill of her metoprolol.  Her permanent A. fib is rate controlled.  She denies any bleeding problems on Eliquis.  10/09/2018  Daisy Williams is seen today in follow-up.  It is been a year since I last saw her.  She denies any symptoms related to her A. fib.  Recently she was notified that she will qualify for a reduced dose of Eliquis.  Her age is 41 and weight is less than 60 kg.  I do not anticipate her gaining any significant weight to be above that in the near future.  I would agree with this dose adjustment.  10/09/2019  Daisy Williams returns today for  follow-up.  Overall she is doing well and she is asymptomatic.  She denies any chest pain or worsening shortness of breath.  She is on the lower dose apixaban due to age over 45 and weight less than 60 kg.  She thinks this is even better than her prior dose of medicine.  She continues on low-dose metoprolol.  She denies any symptomatic A. fib.  She is in A. fib however with rate control.  EKG shows some lateral T wave changes  which are stable and likely represent repolarization abnormality.  12/08/2020  Daisy Williams is seen today for follow-up.  She continues to do well.  She is now 87 years old.  She is on low-dose apixaban denies any bleeding issues.  She has had no falls.  Her A. fib is per minute.  She is asymptomatic with it.  PMHx:  Past Medical History:  Diagnosis Date  . Glaucoma   . Hematuria    Patient reports PCP has followed, improved since using cranberry juice  . Inner ear dysfunction   . Melanoma ([place])    Skin cancer removal    No past surgical history on file.  FAMHx:  Family History  Problem Relation Age of Onset  . Colon cancer Mother   . Heart disease Father        Father passed away at 65. Was taking heart medicine - "didn't last a month after they put him on it."  . Heart disease Brother        Brother was on heart medicine. He went off his heart medicine and died shortly after - age 57. (heart attack?)  . Other Sister        Sister was a nursing home patient and passed away at 5 of unknown cause.    SOCHx:   reports that she has never smoked. She has never used smokeless tobacco. She reports that she does not drink alcohol and does not use drugs.  ALLERGIES:  No Known Allergies  ROS: Pertinent items noted in HPI and remainder of comprehensive ROS otherwise negative.  HOME MEDS: Current Outpatient Medications  Medication Sig Dispense Refill  . apixaban (ELIQUIS) 2.5 MG TABS tablet Take 1 tablet (2.5 mg total) by mouth 2 (two) times daily. 180 tablet 3  . CALCIUM-VITAMIN D PO Take 2 tablets by mouth daily.    . Cyanocobalamin (VITAMIN B 12 PO) Take 1 tablet by mouth daily.    . dorzolamide-timolol (COSOPT) 22.3-6.8 MG/ML ophthalmic solution Place 1 drop into both eyes 2 (two) times daily.   11  . latanoprost (XALATAN) 0.005 % ophthalmic solution Place 1 drop into both eyes at bedtime.    . metoprolol tartrate (LOPRESSOR) 25 MG tablet TAKE 1/2 TABLET BY MOUTH 2 TIMES A  DAY. PLEASE KEEP UPCOMING APPT FOR FUTURE REFILLS. THANK YOU. 90 tablet 3  . timolol (TIMOPTIC) 0.5 % ophthalmic solution Place 1 drop into both eyes 2 (two) times daily.  11   No current facility-administered medications for this visit.    LABS/IMAGING: No results found for this or any previous visit (from the past 48 hour(s)). No results found.  WEIGHTS: Wt Readings from Last 3 Encounters:  12/08/20 115 lb 12.8 oz (52.5 kg)  10/09/19 118 lb 12.8 oz (53.9 kg)  10/09/18 123 lb (55.8 kg)    VITALS: BP (!) 114/58   Pulse (!) 56   Ht 5\' 3"  (1.6 m)   Wt 115 lb 12.8 oz (52.5 kg)   SpO2 100%  BMI 20.51 kg/m   EXAM: General appearance: alert and no distress Neck: no carotid bruit and no JVD Lungs: clear to auscultation bilaterally Heart: irregularly irregular rhythm Abdomen: soft, non-tender; bowel sounds normal; no masses,  no organomegaly Extremities: extremities normal, atraumatic, no cyanosis or edema Pulses: 2+ and symmetric Skin: Skin color, texture, turgor normal. No rashes or lesions Neurologic: Grossly normal Psych: Pleasant  EKG: A. fib with slow ventricular response at 56, RBBB-personally reviewed  ASSESSMENT: 1. Permanent atrial fibrillation with SVR 2. Normal LV function with moderate biatrial enlargement 3. Hypertension - controlled 4. CHADSVASC 4 on Eliquis 5. RBBB  PLAN: 1.   Ms. Collings has permanent atrial fibrillation.  Her blood pressures well controlled.  She is tolerating low-dose Eliquis.  No medication changes today.  Plan follow-up annually or sooner as necessary.  Pixie Casino, MD, [facility], [place] Director of the [facility]  [facility] Attending Cardiologist  Direct Dial: [phone]  Fax: [phone]  Website:  [email or web address] 12/08/2020, 11:03 AM

## 2020-12-08 NOTE — Patient Instructions (Signed)
Medication Instructions:  Your physician recommends that you continue on your current medications as directed. Please refer to the Current Medication list given to you today.  *If you need a refill on your cardiac medications before your next appointment, please call your pharmacy*   Follow-Up: At [facility], you and your health needs are our priority.  As part of our continuing mission to provide you with exceptional heart care, we have created designated Provider Care Teams.  These Care Teams include your primary Cardiologist (physician) and Advanced Practice Providers (APPs -  Physician Assistants and Nurse Practitioners) who all work together to provide you with the care you need, when you need it.  We recommend signing up for the patient portal called "MyChart".  Sign up information is provided on this After Visit Summary.  MyChart is used to connect with patients for Virtual Visits (Telemedicine).  Patients are able to view lab/test results, encounter notes, upcoming appointments, etc.  Non-urgent messages can be sent to your provider as well.   To learn more about what you can do with MyChart, go to [email or web address].    Your next appointment:   12 month(s)  The format for your next appointment:   In Person  Provider:   You may see Dr. Hilty or one of the following Advanced Practice Providers on your designated Care Team:   Hao Meng, PA-C Angela Duke, PA-C or  Krista Kroeger, PA-C    

## 2020-12-11 DIAGNOSIS — H26493 Other secondary cataract, bilateral: Secondary | ICD-10-CM | POA: Diagnosis not present

## 2020-12-11 DIAGNOSIS — H04123 Dry eye syndrome of bilateral lacrimal glands: Secondary | ICD-10-CM | POA: Diagnosis not present

## 2020-12-11 DIAGNOSIS — H0100B Unspecified blepharitis left eye, upper and lower eyelids: Secondary | ICD-10-CM | POA: Diagnosis not present

## 2020-12-11 DIAGNOSIS — H0100A Unspecified blepharitis right eye, upper and lower eyelids: Secondary | ICD-10-CM | POA: Diagnosis not present

## 2020-12-23 DIAGNOSIS — R040 Epistaxis: Secondary | ICD-10-CM | POA: Diagnosis not present

## 2021-02-15 ENCOUNTER — Encounter (HOSPITAL_COMMUNITY): Payer: Self-pay | Admitting: Emergency Medicine

## 2021-02-15 ENCOUNTER — Emergency Department (HOSPITAL_COMMUNITY): Payer: Medicare Other

## 2021-02-15 ENCOUNTER — Inpatient Hospital Stay (HOSPITAL_COMMUNITY)
Admission: EM | Admit: 2021-02-15 | Discharge: 2021-02-25 | DRG: 871 | Disposition: A | Discharge status: Hospice | Payer: Medicare Other | Attending: Internal Medicine | Admitting: Internal Medicine

## 2021-02-15 ENCOUNTER — Other Ambulatory Visit: Payer: Self-pay

## 2021-02-15 DIAGNOSIS — K651 Peritoneal abscess: Secondary | ICD-10-CM | POA: Diagnosis not present

## 2021-02-15 DIAGNOSIS — K5732 Diverticulitis of large intestine without perforation or abscess without bleeding: Secondary | ICD-10-CM

## 2021-02-15 DIAGNOSIS — H409 Unspecified glaucoma: Secondary | ICD-10-CM | POA: Diagnosis present

## 2021-02-15 DIAGNOSIS — K5730 Diverticulosis of large intestine without perforation or abscess without bleeding: Secondary | ICD-10-CM | POA: Diagnosis not present

## 2021-02-15 DIAGNOSIS — I4819 Other persistent atrial fibrillation: Secondary | ICD-10-CM | POA: Diagnosis not present

## 2021-02-15 DIAGNOSIS — I272 Pulmonary hypertension, unspecified: Secondary | ICD-10-CM | POA: Diagnosis present

## 2021-02-15 DIAGNOSIS — Z66 Do not resuscitate: Secondary | ICD-10-CM | POA: Diagnosis not present

## 2021-02-15 DIAGNOSIS — N390 Urinary tract infection, site not specified: Secondary | ICD-10-CM | POA: Diagnosis not present

## 2021-02-15 DIAGNOSIS — J90 Pleural effusion, not elsewhere classified: Secondary | ICD-10-CM | POA: Diagnosis not present

## 2021-02-15 DIAGNOSIS — Z79899 Other long term (current) drug therapy: Secondary | ICD-10-CM

## 2021-02-15 DIAGNOSIS — Z7901 Long term (current) use of anticoagulants: Secondary | ICD-10-CM

## 2021-02-15 DIAGNOSIS — Z8249 Family history of ischemic heart disease and other diseases of the circulatory system: Secondary | ICD-10-CM | POA: Diagnosis not present

## 2021-02-15 DIAGNOSIS — D539 Nutritional anemia, unspecified: Secondary | ICD-10-CM | POA: Diagnosis not present

## 2021-02-15 DIAGNOSIS — I4510 Unspecified right bundle-branch block: Secondary | ICD-10-CM | POA: Diagnosis present

## 2021-02-15 DIAGNOSIS — E876 Hypokalemia: Secondary | ICD-10-CM | POA: Diagnosis not present

## 2021-02-15 DIAGNOSIS — A419 Sepsis, unspecified organism: Principal | ICD-10-CM | POA: Diagnosis present

## 2021-02-15 DIAGNOSIS — K5720 Diverticulitis of large intestine with perforation and abscess without bleeding: Secondary | ICD-10-CM | POA: Diagnosis present

## 2021-02-15 DIAGNOSIS — E871 Hypo-osmolality and hyponatremia: Secondary | ICD-10-CM | POA: Diagnosis present

## 2021-02-15 DIAGNOSIS — Z20822 Contact with and (suspected) exposure to covid-19: Secondary | ICD-10-CM | POA: Diagnosis present

## 2021-02-15 DIAGNOSIS — R062 Wheezing: Secondary | ICD-10-CM | POA: Diagnosis not present

## 2021-02-15 DIAGNOSIS — I10 Essential (primary) hypertension: Secondary | ICD-10-CM | POA: Diagnosis present

## 2021-02-15 DIAGNOSIS — I351 Nonrheumatic aortic (valve) insufficiency: Secondary | ICD-10-CM | POA: Diagnosis not present

## 2021-02-15 DIAGNOSIS — I083 Combined rheumatic disorders of mitral, aortic and tricuspid valves: Secondary | ICD-10-CM | POA: Diagnosis present

## 2021-02-15 DIAGNOSIS — K7689 Other specified diseases of liver: Secondary | ICD-10-CM | POA: Diagnosis not present

## 2021-02-15 DIAGNOSIS — Z85820 Personal history of malignant melanoma of skin: Secondary | ICD-10-CM

## 2021-02-15 DIAGNOSIS — J9811 Atelectasis: Secondary | ICD-10-CM | POA: Diagnosis not present

## 2021-02-15 DIAGNOSIS — K5780 Diverticulitis of intestine, part unspecified, with perforation and abscess without bleeding: Secondary | ICD-10-CM

## 2021-02-15 DIAGNOSIS — J439 Emphysema, unspecified: Secondary | ICD-10-CM | POA: Diagnosis present

## 2021-02-15 DIAGNOSIS — R339 Retention of urine, unspecified: Secondary | ICD-10-CM

## 2021-02-15 DIAGNOSIS — Z515 Encounter for palliative care: Secondary | ICD-10-CM | POA: Diagnosis not present

## 2021-02-15 DIAGNOSIS — Z28310 Unvaccinated for covid-19: Secondary | ICD-10-CM

## 2021-02-15 DIAGNOSIS — R6520 Severe sepsis without septic shock: Secondary | ICD-10-CM | POA: Diagnosis present

## 2021-02-15 DIAGNOSIS — Z7189 Other specified counseling: Secondary | ICD-10-CM | POA: Diagnosis not present

## 2021-02-15 DIAGNOSIS — Z800 Family history of malignant neoplasm of digestive organs: Secondary | ICD-10-CM | POA: Diagnosis not present

## 2021-02-15 DIAGNOSIS — I700 Atherosclerosis of aorta: Secondary | ICD-10-CM | POA: Diagnosis not present

## 2021-02-15 DIAGNOSIS — E861 Hypovolemia: Secondary | ICD-10-CM | POA: Diagnosis present

## 2021-02-15 DIAGNOSIS — N179 Acute kidney failure, unspecified: Secondary | ICD-10-CM | POA: Diagnosis not present

## 2021-02-15 DIAGNOSIS — E8770 Fluid overload, unspecified: Secondary | ICD-10-CM | POA: Diagnosis not present

## 2021-02-15 DIAGNOSIS — H9190 Unspecified hearing loss, unspecified ear: Secondary | ICD-10-CM | POA: Diagnosis present

## 2021-02-15 DIAGNOSIS — I4891 Unspecified atrial fibrillation: Secondary | ICD-10-CM | POA: Diagnosis not present

## 2021-02-15 DIAGNOSIS — R109 Unspecified abdominal pain: Secondary | ICD-10-CM | POA: Diagnosis not present

## 2021-02-15 DIAGNOSIS — R531 Weakness: Secondary | ICD-10-CM | POA: Diagnosis not present

## 2021-02-15 LAB — BASIC METABOLIC PANEL
Anion gap: 8 (ref 5–15)
Anion gap: 7 (ref 5–15)
BUN: 8 mg/dL (ref 8–23)
BUN: 9 mg/dL (ref 8–23)
CO2: 17 mmol/L — ABNORMAL LOW (ref 22–32)
CO2: 20 mmol/L — ABNORMAL LOW (ref 22–32)
Calcium: 8.1 mg/dL — ABNORMAL LOW (ref 8.9–10.3)
Calcium: 8.2 mg/dL — ABNORMAL LOW (ref 8.9–10.3)
Chloride: 97 mmol/L — ABNORMAL LOW (ref 98–111)
Chloride: 96 mmol/L — ABNORMAL LOW (ref 98–111)
Creatinine, Ser: 0.69 mg/dL (ref 0.44–1.00)
Creatinine, Ser: 0.78 mg/dL (ref 0.44–1.00)
GFR, Estimated: >60 mL/min (ref >60)
GFR, Estimated: >60 mL/min (ref >60)
Glucose, Bld: 98 mg/dL (ref 70–99)
Glucose, Bld: 105 mg/dL — ABNORMAL HIGH (ref 70–99)
Potassium: 3.3 mmol/L — ABNORMAL LOW (ref 3.5–5.1)
Potassium: 3 mmol/L — ABNORMAL LOW (ref 3.5–5.1)
Sodium: 122 mmol/L — ABNORMAL LOW (ref 135–145)
Sodium: 123 mmol/L — ABNORMAL LOW (ref 135–145)

## 2021-02-15 LAB — CBC WITH DIFFERENTIAL/PLATELET
Abs Immature Granulocytes: 0.46 10*3/uL — ABNORMAL HIGH (ref 0.00–0.07)
Basophils Absolute: 0.1 10*3/uL (ref 0.0–0.1)
Basophils Relative: 0 %
Eosinophils Absolute: 0.1 10*3/uL (ref 0.0–0.5)
Eosinophils Relative: 1 %
HCT: 28.9 % — ABNORMAL LOW (ref 36.0–46.0)
Hemoglobin: 10.3 g/dL — ABNORMAL LOW (ref 12.0–15.0)
Immature Granulocytes: 3 %
Lymphocytes Relative: 2 %
Lymphs Abs: 0.3 10*3/uL — ABNORMAL LOW (ref 0.7–4.0)
MCH: 39.9 pg — ABNORMAL HIGH (ref 26.0–34.0)
MCHC: 35.6 g/dL (ref 30.0–36.0)
MCV: 112 fL — ABNORMAL HIGH (ref 80.0–100.0)
Monocytes Absolute: 1.2 10*3/uL — ABNORMAL HIGH (ref 0.1–1.0)
Monocytes Relative: 8 %
Neutro Abs: 13 10*3/uL — ABNORMAL HIGH (ref 1.7–7.7)
Neutrophils Relative %: 86 %
Platelets: 165 10*3/uL (ref 150–400)
RBC: 2.58 MIL/uL — ABNORMAL LOW (ref 3.87–5.11)
RDW: 13.3 % (ref 11.5–15.5)
WBC: 15.1 10*3/uL — ABNORMAL HIGH (ref 4.0–10.5)
nRBC: 0 % (ref 0.0–0.2)

## 2021-02-15 LAB — BILIRUBIN, FRACTIONATED(TOT/DIR/INDIR)
Bilirubin, Direct: 1.7 mg/dL — ABNORMAL HIGH (ref 0.0–0.2)
Indirect Bilirubin: 1.6 mg/dL — ABNORMAL HIGH (ref 0.3–0.9)
Total Bilirubin: 3.3 mg/dL — ABNORMAL HIGH (ref 0.3–1.2)

## 2021-02-15 LAB — HEPATITIS PANEL, ACUTE
HCV Ab: NONREACTIVE
Hep A IgM: NONREACTIVE
Hep B C IgM: NONREACTIVE
Hepatitis B Surface Ag: NONREACTIVE

## 2021-02-15 LAB — RESP PANEL BY RT-PCR (FLU A&B, COVID) ARPGX2
Influenza A by PCR: NEGATIVE
Influenza B by PCR: NEGATIVE
SARS Coronavirus 2 by RT PCR: NEGATIVE

## 2021-02-15 LAB — COMPREHENSIVE METABOLIC PANEL
ALT: 20 U/L (ref 0–44)
AST: 25 U/L (ref 15–41)
Albumin: 2.8 g/dL — ABNORMAL LOW (ref 3.5–5.0)
Alkaline Phosphatase: 101 U/L (ref 38–126)
Anion gap: 8 (ref 5–15)
BUN: 11 mg/dL (ref 8–23)
CO2: 17 mmol/L — ABNORMAL LOW (ref 22–32)
Calcium: 8.4 mg/dL — ABNORMAL LOW (ref 8.9–10.3)
Chloride: 93 mmol/L — ABNORMAL LOW (ref 98–111)
Creatinine, Ser: 0.78 mg/dL (ref 0.44–1.00)
GFR, Estimated: >60 mL/min (ref >60)
Glucose, Bld: 148 mg/dL — ABNORMAL HIGH (ref 70–99)
Potassium: 3.6 mmol/L (ref 3.5–5.1)
Sodium: 118 mmol/L — CL (ref 135–145)
Total Bilirubin: 3.1 mg/dL — ABNORMAL HIGH (ref 0.3–1.2)
Total Protein: 5.7 g/dL — ABNORMAL LOW (ref 6.5–8.1)

## 2021-02-15 LAB — URINALYSIS, ROUTINE W REFLEX MICROSCOPIC
Bilirubin Urine: NEGATIVE
Glucose, UA: NEGATIVE mg/dL
Ketones, ur: NEGATIVE mg/dL
Nitrite: NEGATIVE
Protein, ur: 30 mg/dL — AB
Specific Gravity, Urine: 1.014 (ref 1.005–1.030)
pH: 6 (ref 5.0–8.0)

## 2021-02-15 LAB — LACTIC ACID, PLASMA
Lactic Acid, Venous: 2.3 mmol/L (ref 0.5–1.9)
Lactic Acid, Venous: 1.9 mmol/L (ref 0.5–1.9)

## 2021-02-15 LAB — LIPASE, BLOOD: Lipase: 27 U/L (ref 11–51)

## 2021-02-15 LAB — SODIUM, URINE, RANDOM: Sodium, Ur: <10 mmol/L

## 2021-02-15 LAB — OSMOLALITY: Osmolality: 255 mOsm/kg — ABNORMAL LOW (ref 275–295)

## 2021-02-15 LAB — OSMOLALITY, URINE: Osmolality, Ur: 275 mOsm/kg — ABNORMAL LOW (ref 300–900)

## 2021-02-15 LAB — LACTATE DEHYDROGENASE: LDH: 134 U/L (ref 98–192)

## 2021-02-15 MED ORDER — ONDANSETRON HCL 4 MG/2ML IJ SOLN: 4.0000 mg | Freq: Four times a day (QID) | INTRAMUSCULAR | Status: DC | PRN

## 2021-02-15 MED ORDER — ONDANSETRON HCL 4 MG PO TABS: 4.0000 mg | ORAL_TABLET | Freq: Four times a day (QID) | ORAL | Status: DC | PRN

## 2021-02-15 MED ORDER — SODIUM CHLORIDE 0.9 % IV SOLN
2.0000 g | Freq: Two times a day (BID) | INTRAVENOUS | Status: DC
  Administered 2021-02-15: 2 g via INTRAVENOUS
  Filled 2021-02-15: qty 2

## 2021-02-15 MED ORDER — DORZOLAMIDE HCL-TIMOLOL MAL 2-0.5 % OP SOLN
1.0000 [drp] | Freq: Two times a day (BID) | OPHTHALMIC | Status: DC
  Administered 2021-02-15 – 2021-02-25 (×20): 1 [drp] via OPHTHALMIC
  Filled 2021-02-15: qty 10

## 2021-02-15 MED ORDER — ACETAMINOPHEN 650 MG RE SUPP: 650.0000 mg | Freq: Four times a day (QID) | RECTAL | Status: DC | PRN

## 2021-02-15 MED ORDER — BRIMONIDINE TARTRATE 0.2 % OP SOLN
1.0000 [drp] | Freq: Three times a day (TID) | OPHTHALMIC | Status: DC
  Administered 2021-02-15 – 2021-02-25 (×29): 1 [drp] via OPHTHALMIC
  Filled 2021-02-15: qty 5

## 2021-02-15 MED ORDER — LACTATED RINGERS IV BOLUS
500.0000 mL | Freq: Once | INTRAVENOUS | Status: AC
  Administered 2021-02-15: 500 mL via INTRAVENOUS

## 2021-02-15 MED ORDER — PIPERACILLIN-TAZOBACTAM 3.375 G IVPB
3.3750 g | Freq: Three times a day (TID) | INTRAVENOUS | Status: DC
  Administered 2021-02-15 – 2021-02-17 (×7): 3.375 g via INTRAVENOUS
  Filled 2021-02-15 (×9): qty 50

## 2021-02-15 MED ORDER — METRONIDAZOLE 500 MG/100ML IV SOLN
500.0000 mg | Freq: Once | INTRAVENOUS | Status: AC
  Administered 2021-02-15: 500 mg via INTRAVENOUS
  Filled 2021-02-15: qty 100

## 2021-02-15 MED ORDER — SODIUM CHLORIDE 0.9% FLUSH
3.0000 mL | Freq: Two times a day (BID) | INTRAVENOUS | Status: DC
  Administered 2021-02-15 – 2021-02-25 (×18): 3 mL via INTRAVENOUS

## 2021-02-15 MED ORDER — IOHEXOL 350 MG/ML SOLN
75.0000 mL | Freq: Once | INTRAVENOUS | Status: AC | PRN
  Administered 2021-02-15: 75 mL via INTRAVENOUS

## 2021-02-15 MED ORDER — ACETAMINOPHEN 325 MG PO TABS
650.0000 mg | ORAL_TABLET | Freq: Four times a day (QID) | ORAL | Status: DC | PRN
  Administered 2021-02-16: 650 mg via ORAL
  Filled 2021-02-15: qty 2

## 2021-02-15 MED ORDER — LATANOPROST 0.005 % OP SOLN
1.0000 [drp] | Freq: Every day | OPHTHALMIC | Status: DC
  Administered 2021-02-15 – 2021-02-24 (×10): 1 [drp] via OPHTHALMIC
  Filled 2021-02-15: qty 2.5

## 2021-02-15 MED ORDER — LACTATED RINGERS IV SOLN: INTRAVENOUS | Status: AC

## 2021-02-15 MED ORDER — SODIUM CHLORIDE 0.9 % IV SOLN: 2.0000 g | Freq: Once | INTRAVENOUS | Status: DC

## 2021-02-15 NOTE — H&P (Addendum)
Date: 02/15/2021               Patient Name:  Daisy Williams MRN: [id number]  DOB: Sep 04, 1928 Age / Sex: 87 y.o., female   PCP: Leonard Downing, MD              Medical Service: Internal Medicine Teaching Service              Attending Physician: Dr. Aldine Contes, MD    First Contact: Oswaldo Conroy, MS4 Pager: [phone]  Second Contact: Dr. Jeralyn Bennett Pager: [phone]  Third Contact Dr. Harvie Heck Pager: [phone]       After Hours (After 5p/  First Contact Pager: [phone]  weekends / holidays): Second Contact Pager: [phone]   Chief Complaint: "I feel weak and my stomach hurts"  History of Present Illness:  Daisy Williams is a 87 year old lady with a past medical history of Afib on Eliquis, hypertension, postural dizziness, and glaucoma who presented to the ED with several days of abdominal pain with nausea and anorexia.  She reports fevers and chills starting on Friday evening. The next day, she had decreased appetite and could not tolerate her usual diet of honey nut cheerios and pineapple. On Saturday, she began to experience lower abdominal pain, dysuria, and 3-4 episodes of small-volume diarrhea daily. The pain comes in waves, seemingly randomly. It improved slightly after she took one of her husband's prescription medications "for pain," although she is unsure of the name. On Sunday, she was unable to leave her house to go to church. This morning, she had one episode of emesis after eating half of a graham cracker. She also notes increased burping over the weekend.   She lives with her husband and was previously performing all ADLs independently, now feeling increasingly weak secondary to poor intake.   She currently denies headache, vision changes, chest pain, difficulty breathing, and cough.   ED course: Patient found to have elevated lactic acid to 2.3>>1.9, white count of 15.1 with left shift, hemoglobin 10.3, albumin 2.8, bilirubin 3.1, and sodium of 118. UA  showed moderate hemoglobinuria, leukocytosis, and many bacteria with blood cultures pending. CT angio abdomen/pelvis showed bowel changes and abscesses likely secondary to perforated sigmoid colon diverticulitis. Patient was started on broad-spectrum antibiotics with cefepime and Flagyl.   Patient was evaluated by surgery team who changed antibiotics to Zosyn, recommended NPO and holding Eliquis with IR consult for drainage preferred to surgical intervention.   IR reviewed images, did not find a safe window for percutaneous drainage. Continued medical management with IV antibiotics was recommended.   Meds:  Current Meds  Medication Sig  . apixaban (ELIQUIS) 2.5 MG TABS tablet Take 1 tablet (2.5 mg total) by mouth 2 (two) times daily.  . brimonidine (ALPHAGAN) 0.2 % ophthalmic solution Place 1 drop into both eyes in the morning and at bedtime.  Marland Kitchen CALCIUM-VITAMIN D PO Take 2 tablets by mouth daily.  . Cyanocobalamin (VITAMIN B 12 PO) Take 1 tablet by mouth daily.  . dorzolamide-timolol (COSOPT) 22.3-6.8 MG/ML ophthalmic solution Place 1 drop into both eyes 2 (two) times daily.   Marland Kitchen latanoprost (XALATAN) 0.005 % ophthalmic solution Place 1 drop into both eyes at bedtime.  . metoprolol tartrate (LOPRESSOR) 25 MG tablet TAKE 1/2 TABLET BY MOUTH 2 TIMES A DAY. PLEASE KEEP UPCOMING APPOINTMENT FOR FUTURE REFILLS. THANK YOU. (Patient taking differently: Take 12.5 mg by mouth 2 (two) times daily.)  . [DISCONTINUED] timolol (TIMOPTIC) 0.5 % ophthalmic solution Place 1 drop  into both eyes 2 (two) times daily.    Allergies: Allergies as of 02/15/2021  . (No Known Allergies)   Past Medical History:  Diagnosis Date  . Glaucoma   . Hematuria    Patient reports PCP has followed, improved since using cranberry juice  . Inner ear dysfunction   . Melanoma ([place])    Skin cancer removal    Family History:  CAD in father and brother, both died in late 31s Colon cancer in mother  Social History:   Patient lives at home with husband. Independent in ADL's.  Remote history of social alcohol and tobacco use. Denies recreational drug use.   Review of Systems: A complete ROS was negative except as per HPI.   Physical Exam: Blood pressure (!) 128/45, pulse 63, temperature 97.7 F (36.5 C), resp. rate (!) 25, height 5\' 3"  (1.6 m), weight 51.7 kg, SpO2 98 %. General: Tired-appearing older lady in no acute distress. Looks younger than stated age.  HENT: Normocephalic, atraumatic. Hearing mildly impaired. Sclera non-icteric, pale.  CV: Irregularly irregular rhythm with S1/S2 present. Peripheral pulses intact bilaterally. Trace BL edema. Pulm: Lungs CTABL. No wheezes or crackles. No increased work of breathing.  Abd: Lower abdomen slightly distended. Suprapubic tenderness to medium palpation, no guarding or rebound. Normoactive bowel sounds in all quadrants.  Derm: Skin dry, intact.  Neuro: Alert and oriented x4. No gross neurologic deficits  Psych: Speech and cognition grossly intact. Thought process linear, logical, and goal-directed   Labs: CBC    Component Value Date/Time   WBC 15.1 (H) 02/15/2021 1030   RBC 2.58 (L) 02/15/2021 1030   HGB 10.3 (L) 02/15/2021 1030   HCT 28.9 (L) 02/15/2021 1030   PLT 165 02/15/2021 1030   MCV 112.0 (H) 02/15/2021 1030   MCH 39.9 (H) 02/15/2021 1030   MCHC 35.6 02/15/2021 1030   RDW 13.3 02/15/2021 1030   LYMPHSABS 0.3 (L) 02/15/2021 1030   MONOABS 1.2 (H) 02/15/2021 1030   EOSABS 0.1 02/15/2021 1030   BASOSABS 0.1 02/15/2021 1030   CMP     Component Value Date/Time   NA 118 (LL) 02/15/2021 1030   K 3.6 02/15/2021 1030   CL 93 (L) 02/15/2021 1030   CO2 17 (L) 02/15/2021 1030   GLUCOSE 148 (H) 02/15/2021 1030   BUN 11 02/15/2021 1030   CREATININE 0.78 02/15/2021 1030   CALCIUM 8.4 (L) 02/15/2021 1030   PROT 5.7 (L) 02/15/2021 1030   ALBUMIN 2.8 (L) 02/15/2021 1030   AST 25 02/15/2021 1030   ALT 20 02/15/2021 1030   ALKPHOS 101  02/15/2021 1030   BILITOT 3.1 (H) 02/15/2021 1030   GFRNONAA >60 02/15/2021 1030   GFRAA >60 07/20/2016 1059   Urinalysis    Component Value Date/Time   COLORURINE AMBER (A) 02/15/2021 1105   APPEARANCEUR CLOUDY (A) 02/15/2021 1105   LABSPEC 1.014 02/15/2021 1105   PHURINE 6.0 02/15/2021 1105   GLUCOSEU NEGATIVE 02/15/2021 1105   HGBUR MODERATE (A) 02/15/2021 1105   BILIRUBINUR NEGATIVE 02/15/2021 1105   KETONESUR NEGATIVE 02/15/2021 1105   PROTEINUR 30 (A) 02/15/2021 1105   UROBILINOGEN 0.2 02/06/2015 1449   NITRITE NEGATIVE 02/15/2021 1105   LEUKOCYTESUR MODERATE (A) 02/15/2021 1105    02/15/2021 13:53  Hep A Ab, IgM NON REACTIVE  Hepatitis B Surface Ag NON REACTIVE  Hep B Core Ab, IgM NON REACTIVE  HCV Ab NON REACTIVE   Component Ref Range & Units 12:58 10:30  Lactic Acid, Venous 0.5 - 1.9 mmol/L  1.9  2.3High Panic    EKG: personally reviewed my interpretation is: atrial fibrillation with normal rate. RBBB and t-wave abnormalities not significantly changed from 12/08/20 EKG.   CT Angio abd/pelvis w/ and w/o contrast: personally reviewed my interpretation is:  VASCULAR No significant stenosis of the mesenteric arteries or veins.  NON-VASCULAR 1. Marked inflammatory changes of the pelvis, likely due to perforated sigmoid colon diverticulitis. Multiple small collections of fluid and air suspicious for abscesses. The largest is located in the left lateral pelvis measuring 5.9 x 1.9 x 2.2 cm. Small amounts of extraluminal air noted within the pelvis indicative of perforation. 2. Diffuse heterogeneous enhancement of the liver without discrete mass. Findings are nonspecific, however most likely due to vascular congestion or hepatitis.  Assessment & Plan by Problem: Active Problems:   [facility]epsis [facility]  Daisy Williams is a 87 year old female with PMHx of persistent atrial fibrillation on Eliquis and glaucoma presenting for abdominal pain and fatigue admitted for sepsis  secondary to perforated sigmoid diverticulitis with abscesses and possible UTI and significant hyponatremia.   #Severe sepsis  #Perforated sigmoid diverticulitis with abscesses #Possible UTI Patient presented with abdominal pain and fatigue for several days duration. She had one episode of emesis on day of presentation. She has also had diarrhea for this duration. On examination, had slightly distended lower abdomen with diminished bowel sounds. Suprapubic tenderness to palpation. Initial concerns for sepsis with hypotension and tachypnea, lactic acidosis and hyperbilirubinemia. Infectious work up significant for UA concerning for UTI. Also had CT Abdomen/Pelvis that revealed multiple abscesses, largest in left lateral pelvis 5.9 x 1.9 x 2.2cm with extraluminal air indicative of perforation. She appears adequately resuscitated per sepsis protocol. Surgery and IR consulted for possible drainage and recommended for continued medical management at this time.  - IV Zosyn 3.375g q8 hrs - Urine culture pending  - F/u blood cultures - Monitor urine output for goal >0.5cc/kg/hr - Repeat CBC, CMP tomorrow morning  - Advance to clear liquid diet  #Hypotonic Hypovolemic Hyponatremia Na 118 at admission. Likely secondary to GI losses in setting of diarrhea and from sepsis as above. Patient does not appear to be confused at this time. She has received fluid resuscitation in ED per sepsis protocol.  - s/p 520ml LR bolus - IV LR at 75 mL/hr - BMP q4 hrs with target correction 6-8 mmol/L over 24 hours - Urine osmolality and urine sodium pending  #Persistent atrial fibrillation Patient has a history of A fib on Eliquis and rate controlled with metoprolol. HR currently in 80-90's, reflecting adequate rate control. Per surgery, recommendations to hold Eliquis for tonight.  - Hold Eliquis overnight - APTT, INR tomorrow morning - TSH pending  - Continue cardiac monitoring   #Macrocytic anemia - Hgb 10.3 from  14.5 in 06/2016, MCV chronically elevated  - Vitamin B12, folate, lactate dehydrogenase, fractionated bilirubin for further evaluation  - Trend CBC   #Non-anion gap metabolic acidosis - CO2 17, likely low from GI losses - Continue to monitor   Dispo: Admit patient to Inpatient with expected length of stay greater than 2 midnights.  Signed: Theodosia Blender, Medical Student 02/15/2021, 7:07 PM  Pager: [phone]  Attestation for Student Documentation:  I personally was present and performed or re-performed the history, physical exam and medical decision-making activities of this service and have verified that the service and findings are accurately documented in the student's note.  Harvie Heck, MD Internal Medicine, PGY-2 02/15/21 8:23 PM Pager # [phone]

## 2021-02-15 NOTE — ED Notes (Signed)
Dr, Ron Parker given critical Na 118

## 2021-02-15 NOTE — ED Triage Notes (Signed)
Pt here from home with c/o chills and no appetite since Friday , along with abd pain

## 2021-02-15 NOTE — Progress Notes (Signed)
Pharmacy Antibiotic Note  Daisy Williams is a 87 y.o. female admitted on 02/15/2021 with potential intra-abdominal infection.  Pharmacy has been consulted for cefepime dosing. Patient presented with abdominal pain and chills, as well as decreased appetite and dysuria. Patient denies back pain, SOB, or diarrhea. Most recent WBC is 15.1 and Scr 0.78.   Plan: Cefepime 2G Q12H  Monitor renal function for potential dose adjustments  Monitor clinical status, culture data, and treatment plan   Height: 5\' 3"  (160 cm) Weight: 51.7 kg (114 lb) IBW/kg (Calculated) : 52.4  Temp (24hrs), Avg:97.7 F (36.5 C), Min:97.7 F (36.5 C), Max:97.7 F (36.5 C)  Recent Labs  Lab 02/15/21 1030  WBC 15.1*  CREATININE 0.78  LATICACIDVEN 2.3*    Estimated Creatinine Clearance: 38.1 mL/min (by C-G formula based on SCr of 0.78 mg/dL).    No Known Allergies  Antimicrobials this admission: 5/23 Cefepime >>  5/23 Flagyl >>   Dose adjustments this admission: NA  Microbiology results: 5/23 BCx:  5/23 UCx:   Thank you for allowing pharmacy to be a part of this patient's care.  Cephus Slater, PharmD, [facility] Resident [phone] 02/15/2021 12:32 PM

## 2021-02-15 NOTE — Progress Notes (Signed)
Pt arriverd to 2 west 36. Alert and oriented x4, identified appropriately. VS stable, pt denied pain, SOB, in no acute distress. Cardiac monitor and continuous pulse ox placed on the patient and CCMD notified.  Pt oriented to room and equipment, instructed to use call bell for assistance, and call bell left within pt reach. Will continue to monitor pt and treat per MD orders.

## 2021-02-15 NOTE — Progress Notes (Signed)
IR consulted by Barkley Boards, PA-C for possible image-guided intra-abdominal fluid collection aspiration/drain placement.  Case/images have been reviewed by Dr. Serafina Royals who states no safe percutaneous window for procedure. Recommends medical management (IV antibiotics). No plans for IR intervention at this time- will delete order. Claiborne Billings, PA-C made aware.  IR available in future if needed.   Bea Graff Eliceo Gladu, PA-C 02/15/2021, 3:31 PM

## 2021-02-15 NOTE — ED Notes (Signed)
Attempted to give reportx1 

## 2021-02-15 NOTE — Consult Note (Signed)
Consult Note  Daisy Williams Feb 11, 1928  [id number].    Requesting MD: Dr. Linwood Dibbles Chief Complaint/Reason for Consult: perforated diverticulitis  HPI:  Patient is a 87 year old female who presented to [facility] with abdominal pain since Friday (5/20). She reports pain has been generalized and was worsening in severity over the course of the weekend. Today it is actually a little better but not resolved. She reports associated anorexia and had some nausea and one episode of emesis today. Emesis was just the food she had just tried to eat. She was having some diarrhea after taking some milk of magnesia to try to relieve abdominal pain, and she reports she has had constipation issues for years. She is unsure when her last colonoscopy was and she has never had a known episode of diverticulitis previously. She also reports some dysuria. PMH otherwise significant for glaucoma, HTN, postural dizziness, atrial fibrillation on eliquis (LD 5/23 AM). Past abdominal surgery includes a tubal ligation at age 6 which appears to have been done through a low midline incision. She has NKDA. She lives at home with her husband and is very independent. She has not been vaccinated against COVID-19.   ROS: Review of Systems  Constitutional: Negative for chills and fever.  Respiratory: Negative for shortness of breath and wheezing.   Cardiovascular: Negative for chest pain and palpitations.  Gastrointestinal: Positive for abdominal pain, diarrhea, nausea and vomiting. Negative for blood in stool, constipation and melena.  Genitourinary: Positive for dysuria. Negative for frequency and urgency.  All other systems reviewed and are negative.   Family History  Problem Relation Age of Onset  . Colon cancer Mother   . Heart disease Father        Father passed away at 68. Was taking heart medicine - "didn't last a month after they put him on it."  . Heart disease Brother        Brother was on heart  medicine. He went off his heart medicine and died shortly after - age 11. (heart attack?)  . Other Sister        Sister was a nursing home patient and passed away at 14 of unknown cause.    Past Medical History:  Diagnosis Date  . Glaucoma   . Hematuria    Patient reports PCP has followed, improved since using cranberry juice  . Inner ear dysfunction   . Melanoma ([place])    Skin cancer removal    History reviewed. No pertinent surgical history.  Social History:  reports that she has never smoked. She has never used smokeless tobacco. She reports that she does not drink alcohol and does not use drugs.  Allergies: No Known Allergies  (Not in a hospital admission)   Blood pressure (!) 130/50, pulse (!) 50, temperature 97.7 F (36.5 C), resp. rate (!) 27, height 5\' 3"  (1.6 m), weight 51.7 kg, SpO2 98 %. Physical Exam:  General: pleasant, WD, elderly female who is laying in bed in NAD HEENT: head is normocephalic, atraumatic.  Sclera are noninjected.  PERRL.  Ears and nose without any masses or lesions.  Mouth is pink and moist. Dentition fair  Heart: irregularly irregular with rate in the 90s to low 100s Palpable radial and pedal pulses bilaterally Lungs: CTAB, no wheezes, rhonchi, or rales noted.  Respiratory effort nonlabored Abd: soft, generalized mild ttp without peritonitis, mild distention, lower midline surgical scar present  MS: all 4 extremities are symmetrical with no cyanosis, clubbing,  or edema. Skin: warm and dry with no masses, lesions, or rashes Neuro: Cranial nerves 2-12 grossly intact, sensation is normal throughout Psych: A&Ox3 with an appropriate affect.   Results for orders placed or performed during the hospital encounter of 02/15/21 (from the past 48 hour(s))  CBC with Differential     Status: Abnormal   Collection Time: 02/15/21 10:30 AM  Result Value Ref Range   WBC 15.1 (H) 4.0 - 10.5 K/uL   RBC 2.58 (L) 3.87 - 5.11 MIL/uL   Hemoglobin 10.3 (L) 12.0 -  15.0 g/dL   HCT 28.9 (L) 36.0 - 46.0 %   MCV 112.0 (H) 80.0 - 100.0 fL   MCH 39.9 (H) 26.0 - 34.0 pg   MCHC 35.6 30.0 - 36.0 g/dL   RDW 13.3 11.5 - 15.5 %   Platelets 165 150 - 400 K/uL   nRBC 0.0 0.0 - 0.2 %   Neutrophils Relative % 86 %   Neutro Abs 13.0 (H) 1.7 - 7.7 K/uL   Lymphocytes Relative 2 %   Lymphs Abs 0.3 (L) 0.7 - 4.0 K/uL   Monocytes Relative 8 %   Monocytes Absolute 1.2 (H) 0.1 - 1.0 K/uL   Eosinophils Relative 1 %   Eosinophils Absolute 0.1 0.0 - 0.5 K/uL   Basophils Relative 0 %   Basophils Absolute 0.1 0.0 - 0.1 K/uL   Immature Granulocytes 3 %   Abs Immature Granulocytes 0.46 (H) 0.00 - 0.07 K/uL   Burr Cells PRESENT     Comment: Performed at [facility], [address], [place], [place] [address]  Comprehensive metabolic panel     Status: Abnormal   Collection Time: 02/15/21 10:30 AM  Result Value Ref Range   Sodium 118 (LL) 135 - 145 mmol/L    Comment: CRITICAL RESULT CALLED TO, READ BACK BY AND VERIFIED WITH:  MOON.K, RN, 1213, 02/15/21, ADEDOKUNE    Potassium 3.6 3.5 - 5.1 mmol/L   Chloride 93 (L) 98 - 111 mmol/L   CO2 17 (L) 22 - 32 mmol/L   Glucose, Bld 148 (H) 70 - 99 mg/dL    Comment: Glucose reference range applies only to samples taken after fasting for at least 8 hours.   BUN 11 8 - 23 mg/dL   Creatinine, Ser 0.78 0.44 - 1.00 mg/dL   Calcium 8.4 (L) 8.9 - 10.3 mg/dL   Total Protein 5.7 (L) 6.5 - 8.1 g/dL   Albumin 2.8 (L) 3.5 - 5.0 g/dL   AST 25 15 - 41 U/L   ALT 20 0 - 44 U/L   Alkaline Phosphatase 101 38 - 126 U/L   Total Bilirubin 3.1 (H) 0.3 - 1.2 mg/dL   GFR, Estimated >60 >60 mL/min    Comment: (NOTE) Calculated using the CKD-EPI Creatinine Equation (2021)    Anion gap 8 5 - 15    Comment: Performed at [facility], [place] [address], [place] [address], [place] [address]  Lipase, blood     Status: None   Collection Time: 02/15/21 10:30 AM  Result Value Ref Range   Lipase 27 11 - 51 U/L    Comment: Performed at [facility] [address] [place], [place] [address]  Lactic acid, plasma     Status: Abnormal   Collection Time: 02/15/21 10:30 AM  Result Value Ref Range   Lactic Acid, Venous 2.3 (HH) 0.5 - 1.9 mmol/L    Comment: CRITICAL RESULT CALLED TO, READ BACK BY AND VERIFIED  WITH: C.COBB RN 1136 02/15/21 MCCORMICK K Performed at [facility] [address], [place], [place] [address]   Urinalysis, Routine w reflex microscopic     Status: Abnormal   Collection Time: 02/15/21 11:05 AM  Result Value Ref Range   Color, Urine AMBER (A) YELLOW    Comment: BIOCHEMICALS MAY BE AFFECTED BY COLOR   APPearance CLOUDY (A) CLEAR   Specific Gravity, Urine 1.014 1.005 - 1.030   pH 6.0 5.0 - 8.0   Glucose, UA NEGATIVE NEGATIVE mg/dL   Hgb urine dipstick MODERATE (A) NEGATIVE   Bilirubin Urine NEGATIVE NEGATIVE   Ketones, ur NEGATIVE NEGATIVE mg/dL   Protein, ur 30 (A) NEGATIVE mg/dL   Nitrite NEGATIVE NEGATIVE   Leukocytes,Ua MODERATE (A) NEGATIVE   RBC / HPF 11-20 0 - 5 RBC/hpf   WBC, UA 21-50 0 - 5 WBC/hpf   Bacteria, UA MANY (A) NONE SEEN   Squamous Epithelial / LPF 6-10 0 - 5   Hyaline Casts, UA PRESENT    Non Squamous Epithelial 0-5 (A) NONE SEEN    Comment: Performed at [facility], [place] [address] [place], [place] [address]   CT Angio Abd/Pel W and/or Wo Contrast  Result Date: 02/15/2021 CLINICAL DATA:  Mesenteric ischemia Acute atrial fibrillation Elevated lactic acid Abdominal pain EXAM: CTA ABDOMEN AND PELVIS WITHOUT AND WITH CONTRAST TECHNIQUE: Multidetector CT imaging of the abdomen and pelvis was performed using the standard protocol during bolus administration of intravenous contrast. Multiplanar reconstructed images and MIPs were obtained and reviewed to evaluate the vascular anatomy. CONTRAST:  17mL OMNIPAQUE IOHEXOL 350 MG/ML SOLN COMPARISON:  None. FINDINGS: VASCULAR Aorta: Diffuse atherosclerotic changes of the abdominal aorta with mild ectasia of the infrarenal segment  measuring up to 1.4 cm. Celiac: No significant stenosis of the celiac artery. SMA: No significant stenosis. The right hepatic artery is replaced, originating from the SMA. Renals: No significant stenosis. IMA: Patent. Inflow: Mild diffuse narrowing of the common iliac arteries due to diffusely calcified plaque. Bilateral internal iliac arteries are patent. No significant narrowing of the external iliac arteries. Proximal Outflow: Bilateral common femoral and visualized portions of the superficial and profunda femoral arteries are patent without evidence of aneurysm, dissection, vasculitis or significant stenosis. Veins: Hepatic, portal, splenic, superior mesenteric veins are patent. Review of the MIP images confirms the above findings. NON-VASCULAR Lower chest: Trace right pleural effusion.  Bibasilar atelectasis. Hepatobiliary: Diffuse heterogeneous enhancement of the liver without discrete mass or biliary dilatation. Is gallbladder is collapsed. Pancreas: Unremarkable. No pancreatic ductal dilatation or surrounding inflammatory changes. Spleen: Normal in size without focal abnormality. Adrenals/Urinary Tract: Adrenal glands are normal. Subcentimeter renal hypodensities are too small to fully characterize, however statistically most likely represent small simple cysts. No hydronephrosis or hydroureter. Bladder is normal in appearance. Stomach/Bowel: No bowel dilatation to indicate ileus or obstruction. Appendix is normal. There is diverticulosis of the descending and sigmoid colon. There is acute inflammation of the sigmoid colon likely due to diverticulitis. Multiple foci of extraluminal air is noted. There is extensive stranding of the Peri sigmoid colon fat. There are multiple collections of fluid/air with the largest located in the left lateral pelvis measuring 5.9 x 1.9 x 2.2 cm which is suspicious for abscess. Multiple additional smaller foci of air and fluid also noted within the pelvis. Lymphatic: No  enlarged lymph nodes. Reproductive: Evaluation of the pelvic organs is difficult as the uterus and ovaries are not discernible independent of the inflammatory changes. Other: Small  ovoid fluid collection noted in the left anterior pelvis subcutaneous tissues measuring 2.2 x 2.1 x 1.3 cm. This may be an additional small abscess. Musculoskeletal: No acute or significant osseous findings. IMPRESSION: VASCULAR No significant stenosis of the mesenteric arteries or veins. NON-VASCULAR 1. Marked inflammatory changes of the pelvis, likely due to perforated sigmoid colon diverticulitis. Multiple small collections of fluid and air suspicious for abscesses. The largest is located in the left lateral pelvis measuring 5.9 x 1.9 x 2.2 cm. Small amounts of extraluminal air noted within the pelvis indicative of perforation. 2. Diffuse heterogeneous enhancement of the liver without discrete mass. Findings are nonspecific, however most likely due to vascular congestion or hepatitis. These results were called by telephone at the time of interpretation on 02/15/2021 at 1:55 pm to provider Nebo , who verbally acknowledged these results. Electronically Signed   By: Miachel Roux M.D.   On: 02/15/2021 13:55      Assessment/Plan HTN A. Fib on eliquis - last dose this AM Postural dizziness Glaucoma  Hyponatremia   Sepsis - secondary to below  Possible UTI - UA with +leukocytes and many bacteria, cx ordered  Possible hepatitis - hep panel pending, Tbili 3.1, LFTs otherwise within normal limits  Perforated sigmoid diverticulitis with abscesses - CT with above, largest collection in L lateral pelvis 5.9 x 1.9 x 2.2 cm - WBC 15 - changed IV abx to Zosyn  - recommend IR consult to evaluate if there is a window for drainage of any collections - hold eliquis - keep NPO for now with ice chips only  - hopefully patient will improve without surgical intervention but we will continue to follow closely   FEN: NPO, IVF VTE:  SCDs, hold eliquis ID: [id number] 5/23; Zosyn 5/23>>  Norm Parcel, [facility] Surgery 02/15/2021, 2:16 PM Please see Amion for pager number during day hours 7:00am-4:30pm

## 2021-02-15 NOTE — Hospital Course (Addendum)
02/23/21: Daisy Williams states she is feeling well this morning without any pain. She says she has been able to eat some without trouble. She says she does not want "any major surgeries" although is unable to draw the line as to when she'd like to stop treatment. She says she was told she'd have to live the rest of her life in a nursing home if she were to have surgery.

## 2021-02-15 NOTE — ED Provider Notes (Addendum)
[facility] EMERGENCY DEPARTMENT Provider Note   CSN: [id number] Arrival date & time: 02/15/21  [id number]    History No chief complaint on file.   Daisy Williams is a 87 y.o. female with a PMH of A. fib with RVR, HTN, postural dizziness and glaucoma who presents for evaluation of abdominal pain. Patient reports on Friday she started having abdominal pain and chills. By Saturday she had loss of appetite and dysuria.  She has also had associated nausea and vomited once this morning while she was trying to eat. She continues to have loss of appetite, generalized abdominal pain and generalized weakness. Patient denies any back pain, chest pain, shortness of breath, headaches, dizziness, hematuria, diarrhea, constipation or bloody stools.   Past Medical History:  Diagnosis Date  . Glaucoma   . Hematuria    Patient reports PCP has followed, improved since using cranberry juice  . Inner ear dysfunction   . Melanoma ([place])    Skin cancer removal    Patient Active Problem List   Diagnosis Date Noted  . Persistent atrial fibrillation ([place]) 09/27/2017  . RBBB 09/27/2017  . Essential hypertension 12/13/2016  . Current use of long term anticoagulation 12/13/2016  . Postural dizziness 02/06/2015  . Atrial fibrillation with controlled ventricular response ([place]) 02/06/2015  . Dehydration 02/06/2015  . Macrocytosis 02/06/2015  . Acute hyperglycemia 02/06/2015  . Prolonged QT interval 02/06/2015  . Glaucoma 02/06/2015  . Elevated blood pressure 02/06/2015  . Vomiting 02/06/2015  . Dizziness 02/06/2015  . Microscopic hematuria 02/06/2015    History reviewed. No pertinent surgical history.   OB History   No obstetric history on file.     Family History  Problem Relation Age of Onset  . Colon cancer Mother   . Heart disease Father        Father passed away at 57. Was taking heart medicine - "didn't last a month after they put him on it."  . Heart disease Brother         Brother was on heart medicine. He went off his heart medicine and died shortly after - age 1. (heart attack?)  . Other Sister        Sister was a nursing home patient and passed away at 95 of unknown cause.    Social History   Tobacco Use  . Smoking status: Never Smoker  . Smokeless tobacco: Never Used  Substance Use Topics  . Alcohol use: No    Alcohol/week: 0.0 standard drinks  . Drug use: No    Home Medications Prior to Admission medications   Medication Sig Start Date End Date Taking? Authorizing Provider  apixaban (ELIQUIS) 2.5 MG TABS tablet Take 1 tablet (2.5 mg total) by mouth 2 (two) times daily. 09/09/20   Hilty, Nadean Corwin, MD  CALCIUM-VITAMIN D PO Take 2 tablets by mouth daily.    [provider]  Cyanocobalamin (VITAMIN B 12 PO) Take 1 tablet by mouth daily.    [provider]  dorzolamide-timolol (COSOPT) 22.3-6.8 MG/ML ophthalmic solution Place 1 drop into both eyes 2 (two) times daily.  08/28/17   [provider]  latanoprost (XALATAN) 0.005 % ophthalmic solution Place 1 drop into both eyes at bedtime. 01/01/15   [provider]  metoprolol tartrate (LOPRESSOR) 25 MG tablet TAKE 1/2 TABLET BY MOUTH 2 TIMES A DAY. PLEASE KEEP UPCOMING APPOINTMENT FOR FUTURE REFILLS. THANK YOU. 12/08/20   Troy Sine, MD  timolol (TIMOPTIC) 0.5 % ophthalmic solution Place 1  drop into both eyes 2 (two) times daily. 11/28/16   [provider]    Allergies    Patient has no known allergies.  Review of Systems   Review of Systems  Constitutional: Positive for appetite change, chills and fatigue. Negative for fever.  Eyes: Negative for visual disturbance.  Respiratory: Negative for cough and shortness of breath.   Cardiovascular: Negative for chest pain and palpitations.  Gastrointestinal: Positive for abdominal pain, nausea and vomiting. Negative for diarrhea.  Genitourinary: Positive for dysuria. Negative for flank pain and hematuria.   Musculoskeletal: Negative for back pain and myalgias.  Skin: Negative for rash.  Neurological: Positive for weakness. Negative for dizziness, numbness and headaches.    Physical Exam Updated Vital Signs BP (!) 114/36   Pulse 65   Temp 97.7 F (36.5 C)   Resp (!) 23   Ht 5\' 3"  (1.6 m)   Wt 51.7 kg   SpO2 99%   BMI 20.19 kg/m   Physical Exam Constitutional:      General: She is not in acute distress.    Appearance: Normal appearance.  HENT:     Head: Normocephalic and atraumatic.     Mouth/Throat:     Mouth: Mucous membranes are dry.  Eyes:     Extraocular Movements: Extraocular movements intact.     Comments: Pale conjunctiva  Cardiovascular:     Rate and Rhythm: Normal rate. Rhythm irregular.     Pulses: Normal pulses.     Heart sounds: Normal heart sounds.  Pulmonary:     Effort: Pulmonary effort is normal.     Breath sounds: Normal breath sounds.  Abdominal:     General: Abdomen is flat. Bowel sounds are normal. There is no distension.     Palpations: Abdomen is soft.     Tenderness: There is abdominal tenderness. There is no guarding or rebound.     Comments: Mild tenderness to palpation of the lower abdomen.  Musculoskeletal:        General: Normal range of motion.     Cervical back: Normal range of motion.  Skin:    General: Skin is warm and dry.  Neurological:     General: No focal deficit present.     Mental Status: She is alert and oriented to person, place, and time.  Psychiatric:        Mood and Affect: Mood normal.    ED Results / Procedures / Treatments   Labs (all labs ordered are listed, but only abnormal results are displayed) Labs Reviewed  CBC WITH DIFFERENTIAL/PLATELET - Abnormal; Notable for the following components:      Result Value   WBC 15.1 (*)    RBC 2.58 (*)    Hemoglobin 10.3 (*)    HCT 28.9 (*)    MCV 112.0 (*)    MCH 39.9 (*)    Neutro Abs 13.0 (*)    Lymphs Abs 0.3 (*)    Monocytes Absolute 1.2 (*)    Abs Immature  Granulocytes 0.46 (*)    All other components within normal limits  COMPREHENSIVE METABOLIC PANEL - Abnormal; Notable for the following components:   Sodium 118 (*)    Chloride 93 (*)    CO2 17 (*)    Glucose, Bld 148 (*)    Calcium 8.4 (*)    Total Protein 5.7 (*)    Albumin 2.8 (*)    Total Bilirubin 3.1 (*)    All other components within normal limits  URINALYSIS,  ROUTINE W REFLEX MICROSCOPIC - Abnormal; Notable for the following components:   Color, Urine AMBER (*)    APPearance CLOUDY (*)    Hgb urine dipstick MODERATE (*)    Protein, ur 30 (*)    Leukocytes,Ua MODERATE (*)    Bacteria, UA MANY (*)    Non Squamous Epithelial 0-5 (*)    All other components within normal limits  LACTIC ACID, PLASMA - Abnormal; Notable for the following components:   Lactic Acid, Venous 2.3 (*)    All other components within normal limits  CULTURE, BLOOD (SINGLE)  CULTURE, BLOOD (SINGLE)  URINE CULTURE  RESP PANEL BY RT-PCR (FLU A&B, COVID) ARPGX2  LIPASE, BLOOD  LACTIC ACID, PLASMA  HEPATITIS PANEL, ACUTE  PROTIME-INR  SODIUM, URINE, RANDOM  OSMOLALITY, URINE    EKG EKG Interpretation  Date/Time:  Monday Feb 15 2021 09:35:10 EDT Ventricular Rate:  73 PR Interval:    QRS Duration: 131 QT Interval:  407 QTC Calculation: 449 R Axis:   72 Text Interpretation: Atrial fibrillation Right bundle branch block Repol abnrm suggests ischemia, diffuse leads Confirmed by Dewaine Conger [phone]) on 02/15/2021 9:40:30 AM   Radiology CT Angio Abd/Pel W and/or Wo Contrast  Result Date: 02/15/2021 CLINICAL DATA:  Mesenteric ischemia Acute atrial fibrillation Elevated lactic acid Abdominal pain EXAM: CTA ABDOMEN AND PELVIS WITHOUT AND WITH CONTRAST TECHNIQUE: Multidetector CT imaging of the abdomen and pelvis was performed using the standard protocol during bolus administration of intravenous contrast. Multiplanar reconstructed images and MIPs were obtained and reviewed to evaluate the vascular anatomy.  CONTRAST:  49mL OMNIPAQUE IOHEXOL 350 MG/ML SOLN COMPARISON:  None. FINDINGS: VASCULAR Aorta: Diffuse atherosclerotic changes of the abdominal aorta with mild ectasia of the infrarenal segment measuring up to 1.4 cm. Celiac: No significant stenosis of the celiac artery. SMA: No significant stenosis. The right hepatic artery is replaced, originating from the SMA. Renals: No significant stenosis. IMA: Patent. Inflow: Mild diffuse narrowing of the common iliac arteries due to diffusely calcified plaque. Bilateral internal iliac arteries are patent. No significant narrowing of the external iliac arteries. Proximal Outflow: Bilateral common femoral and visualized portions of the superficial and profunda femoral arteries are patent without evidence of aneurysm, dissection, vasculitis or significant stenosis. Veins: Hepatic, portal, splenic, superior mesenteric veins are patent. Review of the MIP images confirms the above findings. NON-VASCULAR Lower chest: Trace right pleural effusion.  Bibasilar atelectasis. Hepatobiliary: Diffuse heterogeneous enhancement of the liver without discrete mass or biliary dilatation. Is gallbladder is collapsed. Pancreas: Unremarkable. No pancreatic ductal dilatation or surrounding inflammatory changes. Spleen: Normal in size without focal abnormality. Adrenals/Urinary Tract: Adrenal glands are normal. Subcentimeter renal hypodensities are too small to fully characterize, however statistically most likely represent small simple cysts. No hydronephrosis or hydroureter. Bladder is normal in appearance. Stomach/Bowel: No bowel dilatation to indicate ileus or obstruction. Appendix is normal. There is diverticulosis of the descending and sigmoid colon. There is acute inflammation of the sigmoid colon likely due to diverticulitis. Multiple foci of extraluminal air is noted. There is extensive stranding of the Peri sigmoid colon fat. There are multiple collections of fluid/air with the largest  located in the left lateral pelvis measuring 5.9 x 1.9 x 2.2 cm which is suspicious for abscess. Multiple additional smaller foci of air and fluid also noted within the pelvis. Lymphatic: No enlarged lymph nodes. Reproductive: Evaluation of the pelvic organs is difficult as the uterus and ovaries are not discernible independent of the inflammatory changes. Other: Small ovoid fluid collection  noted in the left anterior pelvis subcutaneous tissues measuring 2.2 x 2.1 x 1.3 cm. This may be an additional small abscess. Musculoskeletal: No acute or significant osseous findings. IMPRESSION: VASCULAR No significant stenosis of the mesenteric arteries or veins. NON-VASCULAR 1. Marked inflammatory changes of the pelvis, likely due to perforated sigmoid colon diverticulitis. Multiple small collections of fluid and air suspicious for abscesses. The largest is located in the left lateral pelvis measuring 5.9 x 1.9 x 2.2 cm. Small amounts of extraluminal air noted within the pelvis indicative of perforation. 2. Diffuse heterogeneous enhancement of the liver without discrete mass. Findings are nonspecific, however most likely due to vascular congestion or hepatitis. These results were called by telephone at the time of interpretation on 02/15/2021 at 1:55 pm to provider [place] , who verbally acknowledged these results. Electronically Signed   By: Miachel Roux M.D.   On: 02/15/2021 13:55    Procedures Procedures   Medications Ordered in ED Medications  lactated ringers infusion (0 mLs Intravenous Paused 02/15/21 1440)  piperacillin-tazobactam (ZOSYN) IVPB 3.375 g (3.375 g Intravenous New Bag/Given 02/15/21 1439)  sodium chloride flush (NS) 0.9 % injection 3 mL (has no administration in time range)  acetaminophen (TYLENOL) tablet 650 mg (has no administration in time range)    Or  acetaminophen (TYLENOL) suppository 650 mg (has no administration in time range)  ondansetron (ZOFRAN) tablet 4 mg (has no administration  in time range)    Or  ondansetron (ZOFRAN) injection 4 mg (has no administration in time range)  lactated ringers bolus 500 mL (0 mLs Intravenous Stopped 02/15/21 1110)  lactated ringers bolus 500 mL (0 mLs Intravenous Stopped 02/15/21 1226)  metroNIDAZOLE (FLAGYL) IVPB 500 mg (0 mg Intravenous Stopped 02/15/21 1409)  iohexol (OMNIPAQUE) 350 MG/ML injection 75 mL (75 mLs Intravenous Contrast Given 02/15/21 1251)    ED Course  I have reviewed the triage vital signs and the nursing notes.  Pertinent labs & imaging results that were available during my care of the patient were reviewed by me and considered in my medical decision making (see chart for details).  Clinical Course as of 02/15/21 1443  Mon Feb 15, 2021  1425 Lactic acid, plasma [PA]    Clinical Course User Index [PA] Lacinda Axon, MD   MDM Rules/Calculators/A&P                         87 year old female with history of A. fib with RVR, HTN, postural dizziness and glaucoma who presents for evaluation of abdominal pain with associated nausea/vomiting and generalized weakness.  On exam, patient with dry mucous membrane and mild tenderness to palpation of the lower abdomen. Labs significant for elevated lactic acid to 2.3 >>1.9, white count of 15.1 with left shift, hemoglobin 10.3, albumin of 2.8, bilirubin of 3.1 and sodium of 118. Patient also has UA showing moderate hemoglobinuria, leukocytosis and many bacteria. Pending blood cultures, and hepatitis labs.   CT angio abdomen/pelvis negative for mesenteric ischemia but shows bowel changes and abscesses likely secondary to perforated sigmoid colon diverticulitis.  Scan also shows diffuse heterogeneous enhancement of the liver.  Patient started on broad-spectrum antibiotics with cefepime and Flagyl.  Currently afebrile and hemodynamically stable.   Surgery consulted and plans to see patient. Patient will be admitted to internal medicine teaching service.  Patient accepted for  admission by IMTS.  Final Clinical Impression(s) / ED Diagnoses Final diagnoses:  Diverticulitis of colon   Rx / DC  Orders ED Discharge Orders    None       Lacinda Axon, MD 02/15/21 1503    Lacinda Axon, MD 02/15/21 1507    Breck Coons, MD 02/15/21 [phone]

## 2021-02-15 NOTE — Progress Notes (Signed)
elink monitoring for sepsis protocol 

## 2021-02-16 DIAGNOSIS — N390 Urinary tract infection, site not specified: Secondary | ICD-10-CM

## 2021-02-16 DIAGNOSIS — I4819 Other persistent atrial fibrillation: Secondary | ICD-10-CM

## 2021-02-16 DIAGNOSIS — E876 Hypokalemia: Secondary | ICD-10-CM

## 2021-02-16 LAB — COMPREHENSIVE METABOLIC PANEL
ALT: 18 U/L (ref 0–44)
AST: 19 U/L (ref 15–41)
Albumin: 2.3 g/dL — ABNORMAL LOW (ref 3.5–5.0)
Alkaline Phosphatase: 109 U/L (ref 38–126)
Anion gap: 7 (ref 5–15)
BUN: 8 mg/dL (ref 8–23)
CO2: 18 mmol/L — ABNORMAL LOW (ref 22–32)
Calcium: 7.9 mg/dL — ABNORMAL LOW (ref 8.9–10.3)
Chloride: 96 mmol/L — ABNORMAL LOW (ref 98–111)
Creatinine, Ser: 0.74 mg/dL (ref 0.44–1.00)
GFR, Estimated: >60 mL/min (ref >60)
Glucose, Bld: 111 mg/dL — ABNORMAL HIGH (ref 70–99)
Potassium: 2.9 mmol/L — ABNORMAL LOW (ref 3.5–5.1)
Sodium: 121 mmol/L — ABNORMAL LOW (ref 135–145)
Total Bilirubin: 3.3 mg/dL — ABNORMAL HIGH (ref 0.3–1.2)
Total Protein: 4.6 g/dL — ABNORMAL LOW (ref 6.5–8.1)

## 2021-02-16 LAB — BASIC METABOLIC PANEL
Anion gap: 9 (ref 5–15)
Anion gap: 7 (ref 5–15)
Anion gap: 9 (ref 5–15)
Anion gap: 8 (ref 5–15)
Anion gap: 6 (ref 5–15)
BUN: 9 mg/dL (ref 8–23)
BUN: 8 mg/dL (ref 8–23)
BUN: 9 mg/dL (ref 8–23)
BUN: 9 mg/dL (ref 8–23)
BUN: 9 mg/dL (ref 8–23)
CO2: 18 mmol/L — ABNORMAL LOW (ref 22–32)
CO2: 21 mmol/L — ABNORMAL LOW (ref 22–32)
CO2: 19 mmol/L — ABNORMAL LOW (ref 22–32)
CO2: 15 mmol/L — ABNORMAL LOW (ref 22–32)
CO2: 16 mmol/L — ABNORMAL LOW (ref 22–32)
Calcium: 7.9 mg/dL — ABNORMAL LOW (ref 8.9–10.3)
Calcium: 8 mg/dL — ABNORMAL LOW (ref 8.9–10.3)
Calcium: 8.3 mg/dL — ABNORMAL LOW (ref 8.9–10.3)
Calcium: 7.7 mg/dL — ABNORMAL LOW (ref 8.9–10.3)
Calcium: 7.4 mg/dL — ABNORMAL LOW (ref 8.9–10.3)
Chloride: 94 mmol/L — ABNORMAL LOW (ref 98–111)
Chloride: 96 mmol/L — ABNORMAL LOW (ref 98–111)
Chloride: 94 mmol/L — ABNORMAL LOW (ref 98–111)
Chloride: 95 mmol/L — ABNORMAL LOW (ref 98–111)
Chloride: 97 mmol/L — ABNORMAL LOW (ref 98–111)
Creatinine, Ser: 0.73 mg/dL (ref 0.44–1.00)
Creatinine, Ser: 0.77 mg/dL (ref 0.44–1.00)
Creatinine, Ser: 0.76 mg/dL (ref 0.44–1.00)
Creatinine, Ser: 0.77 mg/dL (ref 0.44–1.00)
Creatinine, Ser: 0.68 mg/dL (ref 0.44–1.00)
GFR, Estimated: >60 mL/min (ref >60)
GFR, Estimated: >60 mL/min (ref >60)
GFR, Estimated: >60 mL/min (ref >60)
GFR, Estimated: >60 mL/min (ref >60)
GFR, Estimated: >60 mL/min (ref >60)
Glucose, Bld: 108 mg/dL — ABNORMAL HIGH (ref 70–99)
Glucose, Bld: 93 mg/dL (ref 70–99)
Glucose, Bld: 110 mg/dL — ABNORMAL HIGH (ref 70–99)
Glucose, Bld: 151 mg/dL — ABNORMAL HIGH (ref 70–99)
Glucose, Bld: 111 mg/dL — ABNORMAL HIGH (ref 70–99)
Potassium: 2.9 mmol/L — ABNORMAL LOW (ref 3.5–5.1)
Potassium: 3.7 mmol/L (ref 3.5–5.1)
Potassium: 3.9 mmol/L (ref 3.5–5.1)
Potassium: 4 mmol/L (ref 3.5–5.1)
Potassium: 3.9 mmol/L (ref 3.5–5.1)
Sodium: 121 mmol/L — ABNORMAL LOW (ref 135–145)
Sodium: 124 mmol/L — ABNORMAL LOW (ref 135–145)
Sodium: 122 mmol/L — ABNORMAL LOW (ref 135–145)
Sodium: 118 mmol/L — CL (ref 135–145)
Sodium: 119 mmol/L — CL (ref 135–145)

## 2021-02-16 LAB — URINE CULTURE: Culture: 60000 — AB

## 2021-02-16 LAB — APTT: aPTT: 38 seconds — ABNORMAL HIGH (ref 24–36)

## 2021-02-16 LAB — CBC
HCT: 26.3 % — ABNORMAL LOW (ref 36.0–46.0)
Hemoglobin: 9.6 g/dL — ABNORMAL LOW (ref 12.0–15.0)
MCH: 39.5 pg — ABNORMAL HIGH (ref 26.0–34.0)
MCHC: 36.5 g/dL — ABNORMAL HIGH (ref 30.0–36.0)
MCV: 108.2 fL — ABNORMAL HIGH (ref 80.0–100.0)
Platelets: 173 10*3/uL (ref 150–400)
RBC: 2.43 MIL/uL — ABNORMAL LOW (ref 3.87–5.11)
RDW: 13.1 % (ref 11.5–15.5)
WBC: 17.2 10*3/uL — ABNORMAL HIGH (ref 4.0–10.5)
nRBC: 0 % (ref 0.0–0.2)

## 2021-02-16 LAB — PROTIME-INR
INR: 1.8 — ABNORMAL HIGH (ref 0.8–1.2)
Prothrombin Time: 21.1 seconds — ABNORMAL HIGH (ref 11.4–15.2)

## 2021-02-16 LAB — MRSA PCR SCREENING: MRSA by PCR: NEGATIVE

## 2021-02-16 LAB — LACTIC ACID, PLASMA: Lactic Acid, Venous: 1.2 mmol/L (ref 0.5–1.9)

## 2021-02-16 LAB — FOLATE: Folate: 9.6 ng/mL (ref >5.9)

## 2021-02-16 LAB — TSH: TSH: 1.346 u[IU]/mL (ref 0.350–4.500)

## 2021-02-16 LAB — VITAMIN B12: Vitamin B-12: 3666 pg/mL — ABNORMAL HIGH (ref 180–914)

## 2021-02-16 MED ORDER — SODIUM CHLORIDE 0.9 % IV BOLUS
1000.0000 mL | Freq: Once | INTRAVENOUS | Status: AC
  Administered 2021-02-17: 1000 mL via INTRAVENOUS

## 2021-02-16 MED ORDER — SODIUM CHLORIDE 0.9 % IV SOLN: INTRAVENOUS | Status: DC

## 2021-02-16 MED ORDER — LACTATED RINGERS IV BOLUS
1000.0000 mL | Freq: Once | INTRAVENOUS | Status: AC
  Administered 2021-02-16: 1000 mL via INTRAVENOUS

## 2021-02-16 MED ORDER — LACTATED RINGERS IV SOLN: INTRAVENOUS | Status: DC

## 2021-02-16 MED ORDER — POTASSIUM CHLORIDE CRYS ER 20 MEQ PO TBCR
40.0000 meq | EXTENDED_RELEASE_TABLET | Freq: Two times a day (BID) | ORAL | Status: AC
  Administered 2021-02-16 (×2): 40 meq via ORAL
  Filled 2021-02-16 (×2): qty 2

## 2021-02-16 MED ORDER — SODIUM CHLORIDE 0.9 % IV BOLUS
1000.0000 mL | Freq: Once | INTRAVENOUS | Status: AC
  Administered 2021-02-16: 1000 mL via INTRAVENOUS

## 2021-02-16 NOTE — Evaluation (Signed)
Physical Therapy Evaluation Patient Details Name: Daisy Williams MRN: [id number] DOB: 1928/09/25 Today's Date: 02/16/2021   History of Present Illness  Pt is a 87 y.o. female admitted 02/15/21 with abdominal pain, nausea, decreased appetite. CT angio abdomen/pelvis showed bowel changes and abscesses likely secondary to perforated sigmoid colon diverticulitis. Workup for sepsis, diverticulitis, possible UTI. IR did not find safe window for percutaneous drainage. Plan for medical management. PMH includes glaucoma, inner ear dysfunction, afib on Eliquis.    Clinical Impression  Pt presents with an overall decrease in functional mobility secondary to above. PTA, pt independent without DME, denies h/o falls, lives with husband who is also in good health; both pt and husband drive. Today, pt moving fairly well without DME, intermittent min guard due to instability; pt reports feeling weak and nauseous, although states this improving with activity. Pt limited by generalized weakness, decreased activity tolerance and impaired balance strategies. Pt would benefit from continued acute PT services to maximize functional mobility and independence prior to d/c with HHPT services.     Follow Up Recommendations Home health PT;Supervision for mobility/OOB    Equipment Recommendations   (TBD)    Recommendations for Other Services       Precautions / Restrictions Precautions Precautions: Fall Restrictions Weight Bearing Restrictions: No      Mobility  Bed Mobility               General bed mobility comments: Received sitting in recliner    Transfers Overall transfer level: Needs assistance Equipment used: None Transfers: Sit to/from Stand Sit to Stand: Min guard         General transfer comment: Min guard for balance  Ambulation/Gait Ambulation/Gait assistance: Min guard Gait Distance (Feet): 80 Feet Assistive device: None Gait Pattern/deviations: Step-through pattern;Decreased  stride length;Trunk flexed Gait velocity: Decreased   General Gait Details: Slow, mildly unsteady gait without DME, min guard for balance; cues to increase step length with some improvement in stability; self-corrected LOB with turning  Stairs            Wheelchair Mobility    Modified Rankin (Stroke Patients Only)       Balance Overall balance assessment: Needs assistance   Sitting balance-Leahy Scale: Fair       Standing balance-Leahy Scale: Fair                               Pertinent Vitals/Pain Pain Assessment: Faces Faces Pain Scale: Hurts little more Pain Location: Sacrum/buttocks from sitting; abdomen (nausea) Pain Descriptors / Indicators: Discomfort Pain Intervention(s): Monitored during session;Limited activity within patient's tolerance;Repositioned    Home Living Family/patient expects to be discharged to:: Private residence Living Arrangements: Spouse/significant other Available Help at Discharge: Family Type of Home: House Home Access: Stairs to enter Entrance Stairs-Rails: Left Entrance Stairs-Number of Steps: 3 Home Layout: One level Home Equipment: None      Prior Function Level of Independence: Independent         Comments: Indepdenent without DME; denies h/o falls; reports husband does majority of driving     Hand Dominance   Dominant Hand: Right    Extremity/Trunk Assessment   Upper Extremity Assessment Upper Extremity Assessment: Generalized weakness    Lower Extremity Assessment Lower Extremity Assessment: Generalized weakness       Communication   Communication: HOH  Cognition Arousal/Alertness: Awake/alert Behavior During Therapy: WFL for tasks assessed/performed Overall Cognitive Status: Within Functional Limits for tasks assessed  General Comments General comments (skin integrity, edema, etc.): SpO2 98% on RA. Pt reports nausea and  sacral/buttocks discomfort improving with activity. Discussed d/c recommendations, pt agreeable to HHPT services, declines SNF; reports husband able to provide necessary physical assist if needed    Exercises     Assessment/Plan    PT Assessment Patient needs continued PT services  PT Problem List Decreased strength;Decreased activity tolerance;Decreased balance;Decreased mobility;Decreased knowledge of use of DME;Pain       PT Treatment Interventions DME instruction;Gait training;Stair training;Functional mobility training;Therapeutic exercise;Balance training;Patient/family education;Therapeutic activities    PT Goals (Current goals can be found in the Care Plan section)  Acute Rehab PT Goals Patient Stated Goal: Return home ("I hope I never have to use anything to help me walk") PT Goal Formulation: With patient Time For Goal Achievement: 03/02/21 Potential to Achieve Goals: Good    Frequency Min 3X/week   Barriers to discharge        Co-evaluation               AM-PAC PT "6 Clicks" Mobility  Outcome Measure Help needed turning from your back to your side while in a flat bed without using bedrails?: None Help needed moving from lying on your back to sitting on the side of a flat bed without using bedrails?: A Little Help needed moving to and from a bed to a chair (including a wheelchair)?: A Little Help needed standing up from a chair using your arms (e.g., wheelchair or bedside chair)?: A Little Help needed to walk in hospital room?: A Little Help needed climbing 3-5 steps with a railing? : A Little 6 Click Score: 19    End of Session Equipment Utilized During Treatment: Gait belt Activity Tolerance: Patient tolerated treatment well;Patient limited by fatigue Patient left: in chair;with call bell/phone within reach;with chair alarm set Nurse Communication: Mobility status PT Visit Diagnosis: Other abnormalities of gait and mobility (R26.89);Muscle weakness  (generalized) (M62.81)    Time: 5732-2025 PT Time Calculation (min) (ACUTE ONLY): 20 min   Charges:   PT Evaluation $PT Eval Moderate Complexity: [place], PT, DPT Acute Rehabilitation Services  Pager [phone] Office [place] 02/16/2021, 1:02 PM

## 2021-02-16 NOTE — Plan of Care (Signed)
  Problem: Clinical Measurements: Goal: Respiratory complications will improve Outcome: Progressing   Problem: Clinical Measurements: Goal: Cardiovascular complication will be avoided Outcome: Progressing   Problem: Clinical Measurements: Goal: Diagnostic test results will improve Outcome: Not Progressing  NA 118

## 2021-02-16 NOTE — Progress Notes (Signed)
Subjective: Patient awake and states she is feeling better today and that her abdominal pain is slightly less today than yesterday.  No nausea.    ROS: See above, otherwise other systems negative  Objective: Vital signs in last 24 hours: Temp:  [97.6 F (36.4 C)-97.7 F (36.5 C)] 97.7 F (36.5 C) (05/24 0819) Pulse Rate:  [28-103] 64 (05/24 0623) Resp:  [9-28] 21 (05/24 0819) BP: (97-147)/(24-106) 97/38 (05/24 0819) SpO2:  [94 %-100 %] 97 % (05/24 0819) Weight:  [51.7 kg] 51.7 kg (05/23 0935) Last BM Date: 02/15/21  Intake/Output from previous day: 05/23 0701 - 05/24 0700 In: 808 [I.V.:757.5; IV Piggyback:50.5] Out: 150 [Urine:150] Intake/Output this shift: Total I/O In: -  Out: 300 [Urine:300]  PE: Gen: NAD Heart: regular Lungs: CTAB Abd: soft, mild suprapubic tenderness noted, but no guarding or peritonitis, +BS, ND  Lab Results:  Recent Labs    02/15/21 1030 02/16/21 0025  WBC 15.1* 17.2*  HGB 10.3* 9.6*  HCT 28.9* 26.3*  PLT 165 173   BMET Recent Labs    02/16/21 0025 02/16/21 0103  NA 121* 121*  K 2.9* 2.9*  CL 96* 94*  CO2 18* 18*  GLUCOSE 111* 108*  BUN 8 9  CREATININE 0.74 0.73  CALCIUM 7.9* 7.9*   PT/INR Recent Labs    02/16/21 0025  LABPROT 21.1*  INR 1.8*   CMP     Component Value Date/Time   NA 121 (L) 02/16/2021 0103   K 2.9 (L) 02/16/2021 0103   CL 94 (L) 02/16/2021 0103   CO2 18 (L) 02/16/2021 0103   GLUCOSE 108 (H) 02/16/2021 0103   BUN 9 02/16/2021 0103   CREATININE 0.73 02/16/2021 0103   CALCIUM 7.9 (L) 02/16/2021 0103   PROT 4.6 (L) 02/16/2021 0025   ALBUMIN 2.3 (L) 02/16/2021 0025   AST 19 02/16/2021 0025   ALT 18 02/16/2021 0025   ALKPHOS 109 02/16/2021 0025   BILITOT 3.3 (H) 02/16/2021 0025   GFRNONAA >60 02/16/2021 0103   GFRAA >60 07/20/2016 1059   Lipase     Component Value Date/Time   LIPASE 27 02/15/2021 1030       Studies/Results: CT Angio Abd/Pel W and/or Wo Contrast  Result Date:  02/15/2021 CLINICAL DATA:  Mesenteric ischemia Acute atrial fibrillation Elevated lactic acid Abdominal pain EXAM: CTA ABDOMEN AND PELVIS WITHOUT AND WITH CONTRAST TECHNIQUE: Multidetector CT imaging of the abdomen and pelvis was performed using the standard protocol during bolus administration of intravenous contrast. Multiplanar reconstructed images and MIPs were obtained and reviewed to evaluate the vascular anatomy. CONTRAST:  27mL OMNIPAQUE IOHEXOL 350 MG/ML SOLN COMPARISON:  None. FINDINGS: VASCULAR Aorta: Diffuse atherosclerotic changes of the abdominal aorta with mild ectasia of the infrarenal segment measuring up to 1.4 cm. Celiac: No significant stenosis of the celiac artery. SMA: No significant stenosis. The right hepatic artery is replaced, originating from the SMA. Renals: No significant stenosis. IMA: Patent. Inflow: Mild diffuse narrowing of the common iliac arteries due to diffusely calcified plaque. Bilateral internal iliac arteries are patent. No significant narrowing of the external iliac arteries. Proximal Outflow: Bilateral common femoral and visualized portions of the superficial and profunda femoral arteries are patent without evidence of aneurysm, dissection, vasculitis or significant stenosis. Veins: Hepatic, portal, splenic, superior mesenteric veins are patent. Review of the MIP images confirms the above findings. NON-VASCULAR Lower chest: Trace right pleural effusion.  Bibasilar atelectasis. Hepatobiliary: Diffuse heterogeneous enhancement of the liver without discrete mass  or biliary dilatation. Is gallbladder is collapsed. Pancreas: Unremarkable. No pancreatic ductal dilatation or surrounding inflammatory changes. Spleen: Normal in size without focal abnormality. Adrenals/Urinary Tract: Adrenal glands are normal. Subcentimeter renal hypodensities are too small to fully characterize, however statistically most likely represent small simple cysts. No hydronephrosis or hydroureter. Bladder  is normal in appearance. Stomach/Bowel: No bowel dilatation to indicate ileus or obstruction. Appendix is normal. There is diverticulosis of the descending and sigmoid colon. There is acute inflammation of the sigmoid colon likely due to diverticulitis. Multiple foci of extraluminal air is noted. There is extensive stranding of the Peri sigmoid colon fat. There are multiple collections of fluid/air with the largest located in the left lateral pelvis measuring 5.9 x 1.9 x 2.2 cm which is suspicious for abscess. Multiple additional smaller foci of air and fluid also noted within the pelvis. Lymphatic: No enlarged lymph nodes. Reproductive: Evaluation of the pelvic organs is difficult as the uterus and ovaries are not discernible independent of the inflammatory changes. Other: Small ovoid fluid collection noted in the left anterior pelvis subcutaneous tissues measuring 2.2 x 2.1 x 1.3 cm. This may be an additional small abscess. Musculoskeletal: No acute or significant osseous findings. IMPRESSION: VASCULAR No significant stenosis of the mesenteric arteries or veins. NON-VASCULAR 1. Marked inflammatory changes of the pelvis, likely due to perforated sigmoid colon diverticulitis. Multiple small collections of fluid and air suspicious for abscesses. The largest is located in the left lateral pelvis measuring 5.9 x 1.9 x 2.2 cm. Small amounts of extraluminal air noted within the pelvis indicative of perforation. 2. Diffuse heterogeneous enhancement of the liver without discrete mass. Findings are nonspecific, however most likely due to vascular congestion or hepatitis. These results were called by telephone at the time of interpretation on 02/15/2021 at 1:55 pm to provider [place] , who verbally acknowledged these results. Electronically Signed   By: Miachel Roux M.D.   On: 02/15/2021 13:55    Anti-infectives: Anti-infectives (From admission, onward)   Start     Dose/Rate Route Frequency Ordered Stop   02/15/21  1500  piperacillin-tazobactam (ZOSYN) IVPB 3.375 g        3.375 g 12.5 mL/hr over 240 Minutes Intravenous Every 8 hours 02/15/21 1412     02/15/21 1230  ceFEPIme (MAXIPIME) 2 g in sodium chloride 0.9 % 100 mL IVPB  Status:  Discontinued        2 g 200 mL/hr over 30 Minutes Intravenous  Once 02/15/21 1221 02/15/21 1223   02/15/21 1230  metroNIDAZOLE (FLAGYL) IVPB 500 mg        500 mg 100 mL/hr over 60 Minutes Intravenous  Once 02/15/21 1221 02/15/21 1409   02/15/21 1230  ceFEPIme (MAXIPIME) 2 g in sodium chloride 0.9 % 100 mL IVPB  Status:  Discontinued        2 g 200 mL/hr over 30 Minutes Intravenous Every 12 hours 02/15/21 1223 02/15/21 1412       Assessment/Plan HTN A. Fib on eliquis - last dose yesterday morning Postural dizziness Glaucoma  Hyponatremia/hypokalemia - per medicine for replacement Possible UTI - UA with +leukocytes and many bacteria, cx ordered  Hyperbilirubinemia - unclear etiology.  Other LFTs unremarkable.  Hepatitis panel is normal.  Perforated sigmoid diverticulitis with abscesses - abscess noted on scan unable to be drained by IR -cont zosyn for now -WBC up some today from 15K to 17K, but abdomen feeling improved and minimally tender -will allow CLD today and follow. -no surgical plans at  this time.  Hopefully patient will gradually improve and not require further intervention.  FEN: CLD, IVF VTE: SCDs, hold eliquis ID: [email or web address] 5/23; Zosyn 5/23>>   LOS: 1 day    Henreitta Cea , [facility] 02/16/2021, 8:40 AM Please see Amion for pager number during day hours 7:00am-4:30pm or 7:00am -11:30am on weekends

## 2021-02-16 NOTE — Progress Notes (Addendum)
BP 104/28. Pt asymptomatic denies SOB, chest pain, dizziness, no change in mental status. Shon Baton, MD notified. LR bolus administered as ordered, BP 121/38. Will continue to monitor pt.

## 2021-02-16 NOTE — Progress Notes (Signed)
Subjective: Overnight team noted low diastolic pressures overnight with only 1110mL urine output. She received 1L LR bolus and mIVF LR rate was increased to 156mL/hr with subsequent stabilization of MAP.   Daisy Williams was resting comfortably in her bed this morning. She reports that she is feeling better today, although continues to experience intermittent suprapubic abdominal pain that comes in waves. She has tolerated clear liquid diet without any episodes of emesis. Notes poor sleep secondary to hospital environment. Denies any dizziness or headache. MD team discussed potential indication for surgical intervention should abdominal abscesses not improve with antibiotic management, she expressed understanding and said she would prefer not to have surgery.   Objective:  Vital signs in last 24 hours: Vitals:   02/16/21 0819 02/16/21 0906 02/16/21 0936 02/16/21 1100  BP: (!) 97/38 (!) 114/44 (!) 125/46 (!) 122/39  Pulse:  79 80 80  Resp: (!) 21 20  20   Temp: 97.7 F (36.5 C)   (!) 97.4 F (36.3 C)  TempSrc: Oral   Oral  SpO2: 97% 96%  100%  Weight:      Height:       Weight change:   Intake/Output Summary (Last 24 hours) at 02/16/2021 1705 Last data filed at 02/16/2021 0733 Gross per 24 hour  Intake 808.01 ml  Output 450 ml  Net 358.01 ml   CBC Latest Ref Rng & Units 02/16/2021 02/15/2021 07/20/2016  WBC 4.0 - 10.5 K/uL 17.2(H) 15.1(H) 7.9  Hemoglobin 12.0 - 15.0 g/dL 9.6(L) 10.3(L) 14.5  Hematocrit 36.0 - 46.0 % 26.3(L) 28.9(L) 41.4  Platelets 150 - 400 K/uL 173 165 309   CMP Latest Ref Rng & Units 02/16/2021 02/16/2021 02/16/2021  Glucose 70 - 99 mg/dL 110(H) 93 108(H)  BUN 8 - 23 mg/dL 9 8 9   Creatinine 0.44 - 1.00 mg/dL 0.76 0.77 0.73  Sodium 135 - 145 mmol/L 122(L) 124(L) 121(L)  Potassium 3.5 - 5.1 mmol/L 3.9 3.7 2.9(L)  Chloride 98 - 111 mmol/L 94(L) 96(L) 94(L)  CO2 22 - 32 mmol/L 19(L) 21(L) 18(L)  Calcium 8.9 - 10.3 mg/dL 8.3(L) 8.0(L) 7.9(L)  Total Protein 6.5 -  8.1 g/dL - - -  Total Bilirubin 0.3 - 1.2 mg/dL - - -  Alkaline Phos 38 - 126 U/L - - -  AST 15 - 41 U/L - - -  ALT 0 - 44 U/L - - -    Ref Range & Units 04:04  (02/16/21) 1 d ago  (02/15/21) 1 d ago  (02/15/21)  Lactic Acid, Venous 0.5 - 1.9 mmol/L 1.2  1.9 CM  2.3High Panic    Ref Range & Units 02/16/21  Vitamin B-12 180 - 914 pg/mL 3,666High    Ref Range & Units 02/16/21  Folate >5.9 ng/mL 9.6     Ref Range & Units 02/16/21  aPTT 24 - 36 seconds 38High     Ref Range & Units 02/16/21  Prothrombin Time 11.4 - 15.2 seconds 21.1High   INR 0.8 - 1.2 1.8High    Ref Range & Units 02/16/21  TSH 0.350 - 4.500 uIU/mL 1.346    Ref Range & Units 02/16/21  Osmolality, Ur 300 - 900 mOsm/kg 275    Ref Range & Units 02/16/21  Sodium, Ur mmol/L <10     Ref Range & Units 02/16/21  LDH 98 - 192 U/L 134    Component Ref Range & Units 1 d ago  (02/15/21) 1 d ago  (02/15/21)  Total Bilirubin 0.3 - 1.2 mg/dL  3.3High  3.1High   Bilirubin, Direct 0.0 - 0.2 mg/dL 1.7High    Indirect Bilirubin 0.3 - 0.9 mg/dL 1.6High     Results for orders placed or performed during the hospital encounter of 02/15/21  Urine culture     Status: Abnormal   Collection Time: 02/15/21 12:21 PM   Specimen: Urine, Random  Result Value Ref Range Status   Specimen Description URINE, RANDOM  Final   Special Requests NONE  Final   Culture (A)  Final    60,000 COLONIES/mL GROUP B STREP(S.AGALACTIAE)ISOLATED TESTING AGAINST S. AGALACTIAE NOT ROUTINELY PERFORMED DUE TO PREDICTABILITY OF AMP/PEN/VAN SUSCEPTIBILITY. Performed at [facility], [place] [address] [place], [place] [address]    Report Status 02/16/2021 FINAL  Final  Culture, blood (single)     Status: None (Preliminary result)   Collection Time: 02/15/21 12:58 PM   Specimen: BLOOD RIGHT WRIST  Result Value Ref Range Status   Specimen Description BLOOD RIGHT WRIST  Final   Special Requests   Final    BOTTLES DRAWN AEROBIC AND ANAEROBIC  Blood Culture results may not be optimal due to an inadequate volume of blood received in culture bottles   Culture   Final    NO GROWTH < 24 HOURS Performed at [facility], [place] [address] [place], [place] [address]    Report Status PENDING  Incomplete  Resp Panel by RT-PCR (Flu A&B, Covid) Nasopharyngeal Swab     Status: None   Collection Time: 02/15/21  2:07 PM   Specimen: Nasopharyngeal Swab; Nasopharyngeal(NP) swabs in vial transport medium  Result Value Ref Range Status   SARS Coronavirus 2 by RT PCR NEGATIVE NEGATIVE Final    Comment: (NOTE) SARS-CoV-2 target nucleic acids are NOT DETECTED.  The SARS-CoV-2 RNA is generally detectable in upper respiratory specimens during the acute phase of infection. The lowest concentration of SARS-CoV-2 viral copies this assay can detect is 138 copies/mL. A negative result does not preclude SARS-Cov-2 infection and should not be used as the sole basis for treatment or other patient management decisions. A negative result may occur with  improper specimen collection/handling, submission of specimen other than nasopharyngeal swab, presence of viral mutation(s) within the areas targeted by this assay, and inadequate number of viral copies(<138 copies/mL). A negative result must be combined with clinical observations, patient history, and epidemiological information. The expected result is Negative.  Fact Sheet for Patients:  EntrepreneurPulse.com.au  Fact Sheet for Healthcare Providers:  IncredibleEmployment.be  This test is no t yet approved or cleared by the [place] FDA and  has been authorized for detection and/or diagnosis of SARS-CoV-2 by FDA under an Emergency Use Authorization (EUA). This EUA will remain  in effect (meaning this test can be used) for the duration of the COVID-19 declaration under Section 564(b)(1) of the Act, 21 U.S.C.section 360bbb-3(b)(1), unless the authorization is  terminated  or revoked sooner.       Influenza A by PCR NEGATIVE NEGATIVE Final   Influenza B by PCR NEGATIVE NEGATIVE Final    Comment: (NOTE) The Xpert Xpress SARS-CoV-2/FLU/RSV plus assay is intended as an aid in the diagnosis of influenza from Nasopharyngeal swab specimens and should not be used as a sole basis for treatment. Nasal washings and aspirates are unacceptable for Xpert Xpress SARS-CoV-2/FLU/RSV testing.  Fact Sheet for Patients: EntrepreneurPulse.com.au  Fact Sheet for Healthcare Providers: IncredibleEmployment.be  This test is not yet approved or cleared by the [place] and has been authorized for  detection and/or diagnosis of SARS-CoV-2 by FDA under an Emergency Use Authorization (EUA). This EUA will remain in effect (meaning this test can be used) for the duration of the COVID-19 declaration under Section 564(b)(1) of the Act, 21 U.S.C. section 360bbb-3(b)(1), unless the authorization is terminated or revoked.  Performed at [facility], [place] [address] [place], [place] [address]   Culture, blood (single)     Status: None (Preliminary result)   Collection Time: 02/15/21  2:27 PM   Specimen: BLOOD  Result Value Ref Range Status   Specimen Description BLOOD SITE NOT SPECIFIED  Final   Special Requests   Final    BOTTLES DRAWN AEROBIC ONLY Blood Culture adequate volume   Culture  Setup Time PENDING  Incomplete   Culture   Final    NO GROWTH < 24 HOURS Performed at [facility], [address] [place], [place] [address]    Report Status PENDING  Incomplete  MRSA PCR Screening     Status: None   Collection Time: 02/16/21 12:00 AM   Specimen: Nasal Mucosa; Nasopharyngeal  Result Value Ref Range Status   MRSA by PCR NEGATIVE NEGATIVE Final    Comment:        The GeneXpert MRSA Assay (FDA approved for NASAL specimens only), is one component of a comprehensive MRSA colonization surveillance  program. It is not intended to diagnose MRSA infection nor to guide or monitor treatment for MRSA infections. Performed at [facility], [place] [address] [place], [place] [address]     Physical Exam: General: Tired-appearing, thin older woman resting in bed in no acute distress. Appears younger than stated age HENT: Normocephalic, atraumatic. Hearing slightly impaired CV: Regular rate, irregularly irregular rhythm. S1/S2 present. No murmurs/rubs/gallops appreciated.  Pulm: Lungs CTAB. No wheezes or crackles. No increased work of breathing.  Abd: Moderate tenderness to palpation of lower abdomen without guarding or rebound. Hyperactive bowel sounds. Derm: Skin dry, intact. No rashes or lesions on visible skin.  Neuro: Alert and grossly oriented. No focal neurologic deficits.  Psych: Thought process is logical, linear, and goal-directed.   Assessment/Plan:  Active Problems:   Sepsis ([place])  87 year old lady with a history of Afib on Eliquis, glaucoma, and hypertension admitted for severe sepsis with potential sources perforated sigmoid diverticulitis with abscesses and UTI.   #Sepsis secondary to perforated sigmoid diverticulitis with abscesses #UTI Initially managed with conservative fluid resuscitation due to concern for rapid correction of hyponatremia. Now having low diastolic pressures with wide pulse pressures, although she has been asymptomatic. Will increase mIVF rate to support MAP>60. She is feeling somewhat better this morning but continues to have tenderness to palpation of lower abdomen. - Urine culture returned >60,000 GBS with predictable amp/pen/van susceptibility  - Continue IV Zosyn, also covers GBS UTI - mIVF LR at 159mL/hr  - F/u 5/23 blood cultures, still pending - Lactic acid 1.2 from 2.3 at presentation  - Continue clear liquid diet for now - She does not desire surgery at this time  #Hypotonic hypovolemic hyponatremia  #Hypokalemia Na is trending up,  corrected from 118 to 124 in initial 24 hours, with most recent value 122. Currently prioritizing fluid resuscitation to maintain pressures while carefully monitoring Na correction. - Potassium stable s/p oral repletion - Continue mIVF as above - Continue to monitor BMP q4hrs  - May consider desmopressin if concern for over-correction of Na  #Persistent atrial fibrillation  Rate controlled without medication. - Continue to hold Eliquis  per surgery recommendations  Code status: DNR Diet: Clear liquids DVT Ppx: SCDs Fluids: mIVF LR 151mL/hr    LOS: 1 day   Daisy Williams, Medical Student 02/16/2021, 5:05 PM

## 2021-02-16 NOTE — Progress Notes (Signed)
Date: 02/16/2021  Patient name: Daisy Williams  Medical record number: [id number]  Date of birth: Mar 21, 1928   I have seen and evaluated Daisy Williams and discussed their care with the Residency Team.  In brief, patient is a 87 year old female with a past medical history of A. fib on Eliquis, hypertension, postural dizziness and glaucoma presented to the ED with worsening abdominal pain and decreased oral intake over the last couple of days.  Patient states that she developed fevers and chills approximately 4 days prior to her admission.  The next day she noted decreased appetite and new onset abdominal pain.  Abdominal pain was noted to be in her lower abdomen, nonradiating, intermittent and associated with some dysuria as well as 3-4 episodes of diarrhea.  On the day prior to admission patient noted an episode of nausea and vomiting after attempting to eat.  Patient was brought to the ED for further evaluation given her poor p.o. intake and persistent abdominal pain.  No chest pain, no palpitations, no lightheadedness, no syncope, no focal weakness, no tingling or numbness, no headache, no blurry vision, no sick contacts.  In the ED, patient was noted to have an elevated lactic acid, leukocytosis and was hyponatremic.  Patient was admitted for further evaluation.  Today, patient states that her abdominal pain is slightly improved and denies any other complaints at this time.  PMHx, Fam Hx, and/or Soc Hx : As per resident admit note  Vitals:   02/16/21 0936 02/16/21 1100  BP: (!) 125/46 (!) 122/39  Pulse: 80 80  Resp:  20  Temp:  (!) 97.4 F (36.3 C)  SpO2:  100%   General: Awake, alert, oriented x3, NAD CVS: Irregularly irregular, normal heart sounds Lungs: CTA bilaterally Abdomen: Soft, mild tenderness to palpation in her suprapubic and right and left lower quadrants, no rebound, no guarding, hyperactive bowel sounds noted Extremities: No edema noted, nontender to palpation Psych:  Normal mood and affect HEENT: Normocephalic, atraumatic Skin: Warm and dry  Assessment and Plan: I have seen and evaluated the patient as outlined above. I agree with the formulated Assessment and Plan as detailed in the residents' note, with the following changes:   1.  Sepsis secondary to perforated sigmoid diverticulitis with abscesses: -Patient presented to the ED with progressively worsening abdominal pain, decreased oral intake as well as nausea and vomiting and was noted to have a perforated sigmoid diverticulitis on CT with multiple abscesses the largest of which is in the left lateral pelvis (5.9 x 1.9 x 2.2 cm).  Patient blood pressures remain borderline with SBP's in the 90s and low 100s.  Patient's leukocytosis is mildly worsened today up to 17 from 15.  Patient was also noted to have lactic acidosis up to 2.3 on admission. -We will continue with IV Zosyn for now -Lactic acidosis has resolved now. -Surgery follow-up recommendations appreciated.  Patient appears to be mildly improved from yesterday clinically.  We will continue to monitor on antibiotics for now.  If symptoms persist would consider repeat CT in the next couple of days. -We will continue with clear liquid diet for now -IR reviewed the images and they are unable to drain the abscesses as there is no safe percutaneous window for the procedure. -Blood cultures no growth to date -We will continue with IV fluids for now.  Patient blood pressures remain borderline. -No further work-up at this time  2.  Hypotonic hypovolemic hyponatremia: -Patient is noted to have a sodium of 118 on admission  with a serum osmolality of 255 and a urine sodium of less than 10 consistent with hypotonic hypovolemic hyponatremia. -We will continue with IV fluids for now.  Goal sodium correction rate is 4 to 6 mEq/day -We will continue to monitor BMP closely -No further work-up at this time -Urine sodium has gradually increased to 122  Daisy Contes, MD 5/24/20223:40 PM

## 2021-02-16 NOTE — TOC Initial Note (Signed)
Transition of Care [facility]) - Initial/Assessment Note    Patient Details  Name: Daisy Williams MRN: [id number] Date of Birth: 1928/06/03  Transition of Care [facility]) CM/SW Contact:    Angelita Ingles, RN Phone Number: [phone]  02/16/2021, 1:46 PM  Clinical Narrative:                 [facility] needs. Patient is from home with her husband. Patient reports that she currently has no DMe and has been independent at home. Patient has PCP that she follows on a regular basis and is able to obtain and afford her medications. Patient is agreeable to [facility]. Choice has been offered and HH has been set up with [place]. TOC will continue to follow.   Expected Discharge Plan: [place] Barriers to Discharge: Continued Medical Work up   Patient Goals and CMS Choice Patient states their goals for this hospitalization and ongoing recovery are:: Wants to go home with husband CMS Medicare.gov Compare Post Acute Care list provided to:: Patient Choice offered to / list presented to : Patient  Expected Discharge Plan and Services Expected Discharge Plan: [place] In-house Referral: NA Discharge Planning Services: CM Consult Post Acute Care Choice: [place] arrangements for the past 2 months: Single Family Home                 DME Arranged: N/A DME Agency: NA       HH Arranged: PT [place] Agency: Other - See comment Jackquline [place]) Date HH Agency Contacted: 02/16/21 Time HH Agency Contacted: 47 Representative spoke with at [place]: [place]  Prior Living Arrangements/Services Living arrangements for the past 2 months: [place] Lives with:: Spouse Patient language and need for interpreter reviewed:: Yes Do you feel safe going back to the place where you live?: Yes      Need for Family Participation in Patient Care: Yes (Comment) Care giver support system in place?: Yes (comment) Current home services: Home PT Criminal  Activity/Legal Involvement Pertinent to Current Situation/Hospitalization: No - Comment as needed  Activities of Daily Living Home Assistive Devices/Equipment: None ADL Screening (condition at time of admission) Patient's cognitive ability adequate to safely complete daily activities?: Yes Is the patient deaf or have difficulty hearing?: No Does the patient have difficulty seeing, even when wearing glasses/contacts?: No Does the patient have difficulty concentrating, remembering, or making decisions?: No Patient able to express need for assistance with ADLs?: Yes Does the patient have difficulty dressing or bathing?: Yes Independently performs ADLs?: No Communication: Independent Dressing (OT): Needs assistance Is this a change from baseline?: Change from baseline, expected to last <3days Grooming: Independent Feeding: Independent Bathing: Needs assistance Is this a change from baseline?: Change from baseline, expected to last <3 days Toileting: Needs assistance Is this a change from baseline?: Change from baseline, expected to last <3 days In/Out Bed: Needs assistance Is this a change from baseline?: Change from baseline, expected to last <3 days Walks in Home: Independent Does the patient have difficulty walking or climbing stairs?: No Weakness of Legs: Both Weakness of Arms/Hands: Both  Permission Sought/Granted   Permission granted to share information with : No              Emotional Assessment Appearance:: Appears stated age Attitude/Demeanor/Rapport: Gracious Affect (typically observed): Pleasant Orientation: : Oriented to Self,Oriented to Place,Oriented to  Time,Oriented to Situation Alcohol / Substance Use: Not Applicable Psych Involvement: No (comment)  Admission  diagnosis:  Diverticulitis of colon [K57.32] Sepsis ([place]) [A41.9] Diverticulitis of intestine with abscess [K57.80] Patient Active Problem List   Diagnosis Date Noted  . Sepsis ([place]) 02/15/2021  .  Persistent atrial fibrillation ([place]) 09/27/2017  . RBBB 09/27/2017  . Essential hypertension 12/13/2016  . Current use of long term anticoagulation 12/13/2016  . Postural dizziness 02/06/2015  . Atrial fibrillation with controlled ventricular response ([place]) 02/06/2015  . Dehydration 02/06/2015  . Macrocytosis 02/06/2015  . Acute hyperglycemia 02/06/2015  . Prolonged QT interval 02/06/2015  . Glaucoma 02/06/2015  . Elevated blood pressure 02/06/2015  . Vomiting 02/06/2015  . Dizziness 02/06/2015  . Microscopic hematuria 02/06/2015   PCP:  Leonard Downing, MD Pharmacy:   [facility], [address] [place] [address] Phone: [phone] Fax: [facility], [address] [place] [address] Phone: [phone] Fax: [phone]     Social Determinants of Health (SDOH) Interventions    Readmission Risk Interventions No flowsheet data found.

## 2021-02-16 NOTE — Plan of Care (Signed)

## 2021-02-16 NOTE — Progress Notes (Signed)
Potassium 2.9. Shon Baton, MD notified.

## 2021-02-16 NOTE — Evaluation (Signed)
Occupational Therapy Evaluation Patient Details Name: Daisy Williams MRN: [id number] DOB: Aug 18, 1928 Today's Date: 02/16/2021    History of Present Illness Pt is a 87 y.o. female admitted 02/15/21 with abdominal pain, nausea, decreased appetite. CT angio abdomen/pelvis showed bowel changes and abscesses likely secondary to perforated sigmoid colon diverticulitis. Workup for sepsis, diverticulitis, possible UTI. IR did not find safe window for percutaneous drainage. Plan for medical management. PMH includes glaucoma, inner ear dysfunction, afib on Eliquis.   Clinical Impression   Pt presents with decline in function and safety with ADLs and ADL mobility with impaired strength, balance and endurance. Pt lives at home with her husband and reports that she was Ind with ADLs/selfcare and used no AD for mobility (gets in tub for bathing). Pt currently requires min A with LB ADLs, min guard A to stand at sink for grooming/hygiene and min guard A with mobility with 1 person HHA. Pt would benefit from acute OT services to address impairments to maximize level of function and safety    Follow Up Recommendations  Home health OT;Supervision - Intermittent    Equipment Recommendations  Tub/shower seat    Recommendations for Other Services       Precautions / Restrictions Precautions Precautions: Fall Restrictions Weight Bearing Restrictions: No      Mobility Bed Mobility Overal bed mobility: Needs Assistance Bed Mobility: Supine to Sit     Supine to sit: Supervision     General bed mobility comments: increased time    Transfers Overall transfer level: Needs assistance Equipment used: 1 person hand held assist Transfers: Sit to/from Stand Sit to Stand: Min guard         General transfer comment: Min guard for balance    Balance Overall balance assessment: Needs assistance Sitting-balance support: No upper extremity supported;Feet supported Sitting balance-Leahy Scale: Fair      Standing balance support: During functional activity Standing balance-Leahy Scale: Fair                             ADL either performed or assessed with clinical judgement   ADL Overall ADL's : Needs assistance/impaired Eating/Feeding: Independent   Grooming: Wash/dry hands;Wash/dry face;Min guard;Standing   Upper Body Bathing: Set up;Supervision/ safety;Sitting   Lower Body Bathing: Minimal assistance   Upper Body Dressing : Set up;Supervision/safety;Sitting   Lower Body Dressing: Minimal assistance   Toilet Transfer: Min guard;Ambulation;Stand-pivot   Toileting- Clothing Manipulation and Hygiene: Minimal assistance;Sit to/from stand       Functional mobility during ADLs: Min guard       Vision Baseline Vision/History: Wears glasses Patient Visual Report: No change from baseline       Perception     Praxis      Pertinent Vitals/Pain Pain Assessment: Faces Faces Pain Scale: Hurts a little bit Pain Location: generalized with mobility Pain Descriptors / Indicators: Discomfort Pain Intervention(s): Monitored during session;Repositioned     Hand Dominance Right   Extremity/Trunk Assessment Upper Extremity Assessment Upper Extremity Assessment: Generalized weakness   Lower Extremity Assessment Lower Extremity Assessment: Defer to PT evaluation       Communication Communication Communication: HOH   Cognition Arousal/Alertness: Awake/alert Behavior During Therapy: WFL for tasks assessed/performed Overall Cognitive Status: Within Functional Limits for tasks assessed  General Comments  SpO2 98% on RA. Pt reports nausea and sacral/buttocks discomfort improving with activity. Discussed d/c recommendations, pt agreeable to HHPT services, declines SNF; reports husband able to provide necessary physical assist if needed    Exercises     Shoulder Instructions      Home Living Family/patient  expects to be discharged to:: Private residence Living Arrangements: Spouse/significant other Available Help at Discharge: Family Type of Home: House Home Access: Stairs to enter Technical brewer of Steps: 3 Entrance Stairs-Rails: Left Home Layout: One level     Bathroom Shower/Tub: Tub/shower unit;Walk-in shower (pt states that she gets in tub at home for bathing and that her husband assist her getting in and out)   Bathroom Toilet: Standard     Home Equipment: None          Prior Functioning/Environment Level of Independence: Independent        Comments: Indepdenent without DME; denies h/o falls; reports husband does majority of driving        OT Problem List: Decreased strength;Impaired balance (sitting and/or standing);Decreased activity tolerance;Decreased knowledge of use of DME or AE      OT Treatment/Interventions:      OT Goals(Current goals can be found in the care plan section) Acute Rehab OT Goals Patient Stated Goal: Return home ("I hope I never have to use anything to help me walk") OT Goal Formulation: With patient Time For Goal Achievement: 03/02/21 Potential to Achieve Goals: Good ADL Goals Pt Will Perform Grooming: with supervision;with set-up;standing Pt Will Perform Upper Body Bathing: with set-up;sitting Pt Will Perform Lower Body Bathing: with min guard assist;with supervision Pt Will Perform Upper Body Dressing: with set-up;sitting Pt Will Perform Lower Body Dressing: with min guard assist;with supervision Pt Will Transfer to Toilet: with supervision;ambulating Pt Will Perform Toileting - Clothing Manipulation and hygiene: with min guard assist  OT Frequency: Min 2X/week   Barriers to D/C:            Co-evaluation              AM-PAC OT "6 Clicks" Daily Activity     Outcome Measure Help from another person eating meals?: None Help from another person taking care of personal grooming?: A Little Help from another person  toileting, which includes using toliet, bedpan, or urinal?: A Little Help from another person bathing (including washing, rinsing, drying)?: A Little Help from another person to put on and taking off regular upper body clothing?: A Little Help from another person to put on and taking off regular lower body clothing?: A Little 6 Click Score: 19   End of Session Equipment Utilized During Treatment: Gait belt Nurse Communication: Mobility status  Activity Tolerance: Patient tolerated treatment well Patient left: in chair;with call bell/phone within reach;with chair alarm set  OT Visit Diagnosis: Unsteadiness on feet (R26.81);Other abnormalities of gait and mobility (R26.89);Muscle weakness (generalized) (M62.81)                Time: 1007-1219 OT Time Calculation (min): 34 min Charges:  OT General Charges $OT Visit: 1 Visit OT Evaluation $OT Eval Moderate Complexity: 1 Mod OT Treatments $Self Care/Home Management : 8-22 mins    Britt Bottom 02/16/2021, 1:28 PM

## 2021-02-17 ENCOUNTER — Inpatient Hospital Stay (HOSPITAL_COMMUNITY): Payer: Medicare Other

## 2021-02-17 DIAGNOSIS — K5720 Diverticulitis of large intestine with perforation and abscess without bleeding: Secondary | ICD-10-CM

## 2021-02-17 DIAGNOSIS — Z66 Do not resuscitate: Secondary | ICD-10-CM

## 2021-02-17 DIAGNOSIS — Z7189 Other specified counseling: Secondary | ICD-10-CM

## 2021-02-17 DIAGNOSIS — K5780 Diverticulitis of intestine, part unspecified, with perforation and abscess without bleeding: Secondary | ICD-10-CM

## 2021-02-17 DIAGNOSIS — I272 Pulmonary hypertension, unspecified: Secondary | ICD-10-CM

## 2021-02-17 DIAGNOSIS — R339 Retention of urine, unspecified: Secondary | ICD-10-CM

## 2021-02-17 DIAGNOSIS — E871 Hypo-osmolality and hyponatremia: Secondary | ICD-10-CM

## 2021-02-17 DIAGNOSIS — Z515 Encounter for palliative care: Secondary | ICD-10-CM

## 2021-02-17 DIAGNOSIS — I700 Atherosclerosis of aorta: Secondary | ICD-10-CM | POA: Diagnosis present

## 2021-02-17 DIAGNOSIS — I351 Nonrheumatic aortic (valve) insufficiency: Secondary | ICD-10-CM | POA: Diagnosis not present

## 2021-02-17 LAB — BASIC METABOLIC PANEL
Anion gap: 7 (ref 5–15)
Anion gap: 11 (ref 5–15)
Anion gap: 7 (ref 5–15)
Anion gap: 7 (ref 5–15)
Anion gap: 7 (ref 5–15)
BUN: 8 mg/dL (ref 8–23)
BUN: 8 mg/dL (ref 8–23)
BUN: 7 mg/dL — ABNORMAL LOW (ref 8–23)
BUN: 6 mg/dL — ABNORMAL LOW (ref 8–23)
BUN: 6 mg/dL — ABNORMAL LOW (ref 8–23)
CO2: 18 mmol/L — ABNORMAL LOW (ref 22–32)
CO2: 14 mmol/L — ABNORMAL LOW (ref 22–32)
CO2: 15 mmol/L — ABNORMAL LOW (ref 22–32)
CO2: 16 mmol/L — ABNORMAL LOW (ref 22–32)
CO2: 17 mmol/L — ABNORMAL LOW (ref 22–32)
Calcium: 7.6 mg/dL — ABNORMAL LOW (ref 8.9–10.3)
Calcium: 7.4 mg/dL — ABNORMAL LOW (ref 8.9–10.3)
Calcium: 7.5 mg/dL — ABNORMAL LOW (ref 8.9–10.3)
Calcium: 7.4 mg/dL — ABNORMAL LOW (ref 8.9–10.3)
Calcium: 7.3 mg/dL — ABNORMAL LOW (ref 8.9–10.3)
Chloride: 98 mmol/L (ref 98–111)
Chloride: 97 mmol/L — ABNORMAL LOW (ref 98–111)
Chloride: 100 mmol/L (ref 98–111)
Chloride: 100 mmol/L (ref 98–111)
Chloride: 96 mmol/L — ABNORMAL LOW (ref 98–111)
Creatinine, Ser: 0.68 mg/dL (ref 0.44–1.00)
Creatinine, Ser: 0.61 mg/dL (ref 0.44–1.00)
Creatinine, Ser: 0.67 mg/dL (ref 0.44–1.00)
Creatinine, Ser: 0.65 mg/dL (ref 0.44–1.00)
Creatinine, Ser: 0.62 mg/dL (ref 0.44–1.00)
GFR, Estimated: >60 mL/min (ref >60)
GFR, Estimated: >60 mL/min (ref >60)
GFR, Estimated: >60 mL/min (ref >60)
GFR, Estimated: >60 mL/min (ref >60)
GFR, Estimated: >60 mL/min (ref >60)
Glucose, Bld: 102 mg/dL — ABNORMAL HIGH (ref 70–99)
Glucose, Bld: 94 mg/dL (ref 70–99)
Glucose, Bld: 122 mg/dL — ABNORMAL HIGH (ref 70–99)
Glucose, Bld: 101 mg/dL — ABNORMAL HIGH (ref 70–99)
Glucose, Bld: 111 mg/dL — ABNORMAL HIGH (ref 70–99)
Potassium: 3.9 mmol/L (ref 3.5–5.1)
Potassium: 4.3 mmol/L (ref 3.5–5.1)
Potassium: 3.2 mmol/L — ABNORMAL LOW (ref 3.5–5.1)
Potassium: 4.1 mmol/L (ref 3.5–5.1)
Potassium: 3.7 mmol/L (ref 3.5–5.1)
Sodium: 123 mmol/L — ABNORMAL LOW (ref 135–145)
Sodium: 122 mmol/L — ABNORMAL LOW (ref 135–145)
Sodium: 122 mmol/L — ABNORMAL LOW (ref 135–145)
Sodium: 123 mmol/L — ABNORMAL LOW (ref 135–145)
Sodium: 120 mmol/L — ABNORMAL LOW (ref 135–145)

## 2021-02-17 LAB — ECHOCARDIOGRAM COMPLETE
AR max vel: 3.03 cm2
AV Area VTI: 2.74 cm2
AV Area mean vel: 2.76 cm2
AV Mean grad: 3 mmHg
AV Peak grad: 5.4 mmHg
Ao pk vel: 1.16 m/s
Area-P 1/2: 4.89 cm2
Height: 63 in
S' Lateral: 2.8 cm
Weight: 1824 oz

## 2021-02-17 LAB — CBC
HCT: 31 % — ABNORMAL LOW (ref 36.0–46.0)
Hemoglobin: 10.8 g/dL — ABNORMAL LOW (ref 12.0–15.0)
MCH: 39 pg — ABNORMAL HIGH (ref 26.0–34.0)
MCHC: 34.8 g/dL (ref 30.0–36.0)
MCV: 111.9 fL — ABNORMAL HIGH (ref 80.0–100.0)
Platelets: 215 10*3/uL (ref 150–400)
RBC: 2.77 MIL/uL — ABNORMAL LOW (ref 3.87–5.11)
RDW: 13.4 % (ref 11.5–15.5)
WBC: 27.5 10*3/uL — ABNORMAL HIGH (ref 4.0–10.5)
nRBC: 0 % (ref 0.0–0.2)

## 2021-02-17 LAB — MAGNESIUM: Magnesium: 1.7 mg/dL (ref 1.7–2.4)

## 2021-02-17 MED ORDER — PIPERACILLIN-TAZOBACTAM 3.375 G IVPB
3.3750 g | Freq: Three times a day (TID) | INTRAVENOUS | Status: DC
  Administered 2021-02-17 – 2021-02-23 (×17): 3.375 g via INTRAVENOUS
  Filled 2021-02-17 (×17): qty 50

## 2021-02-17 MED ORDER — SODIUM CHLORIDE 0.9 % IV BOLUS
1000.0000 mL | Freq: Once | INTRAVENOUS | Status: AC
  Administered 2021-02-17: 1000 mL via INTRAVENOUS

## 2021-02-17 MED ORDER — IOHEXOL 300 MG/ML  SOLN
75.0000 mL | Freq: Once | INTRAMUSCULAR | Status: AC | PRN
  Administered 2021-02-17: 75 mL via INTRAVENOUS

## 2021-02-17 MED ORDER — POTASSIUM CHLORIDE 10 MEQ/100ML IV SOLN
10.0000 meq | INTRAVENOUS | Status: AC
  Administered 2021-02-17 (×6): 10 meq via INTRAVENOUS
  Filled 2021-02-17 (×6): qty 100

## 2021-02-17 MED ORDER — SODIUM CHLORIDE 0.9 % IV SOLN: INTRAVENOUS | Status: DC

## 2021-02-17 MED ORDER — MAGNESIUM SULFATE 2 GM/50ML IV SOLN
2.0000 g | Freq: Once | INTRAVENOUS | Status: AC
  Administered 2021-02-17: 2 g via INTRAVENOUS
  Filled 2021-02-17: qty 50

## 2021-02-17 NOTE — Progress Notes (Signed)
Subjective: Overnight, BMP resulted with Na down to 118 from 122. She had low pressures with one episode of hypotension (100/31, MAP 51). BP stabilized after total of 3x 1L NS bolus and mIVF was changed from LR at 182mL/hr to 0.9% NS at 173mL/hr to .   Daisy Williams was seen resting comfortably in her bed this morning. She says her abdominal pain is improving somewhat, only really bothersome when she has a BM. Notes two watery BMs this AM in close succession, not bloody or malodorous. She continues to endorse anorexia but currently denies nausea or vomiting. Her generalized weakness has improved slightly since admission. She received Tylenol last night for a headache, otherwise denies dizziness or lightheadedness.   MD team once again discussed potential for surgical intervention given ongoing concern for infection with increase in WBC count. She stated she would be "fine with it if you think it is really necessary." Advised her that surgery team would follow up.   Objective:  Vital signs in last 24 hours: Vitals:   02/17/21 0600 02/17/21 0812 02/17/21 1206 02/17/21 1258  BP: (!) 157/51 (!) 153/41 (!) 156/46 (!) 142/74  Pulse: 79   88  Resp: (!) 0 20 19 17   Temp:  97.9 F (36.6 C) 97.6 F (36.4 C)   TempSrc:  Oral Oral   SpO2: 100% 100% 94% 96%  Weight:      Height:       Weight change:   Intake/Output Summary (Last 24 hours) at 02/17/2021 1545 Last data filed at 02/17/2021 0600 Gross per 24 hour  Intake 1117.84 ml  Output 951 ml  Net 166.84 ml   Physical Exam: General: Thin, older woman resting comfortably in bed in no acute distress. Appears younger than stated age HENT: Normocephalic, atraumatic. Moist mucus membranes. Hearing mildly impaired  CV: Regular rate, irregularly irregular rhythm. S1/S2 present. No murmurs/rubs/or gallops appreciated. 1+ pitting edema to mid-shin BL, legs are tender to palpation Pulm: Lungs CTAB. No wheezes or crackles. Breathing comfortably on room  air Abd: Nondistended abdomen with well-healed low transverse scar. Tender to moderate palpation in periumbilical region. No guarding or rebound. Hyperactive bowel sounds in LLQ.  Derm: Skin dry, wrinkled, intact. No rashes or lesions on visible skin Neuro: Alert and grossly oriented. Moves all extremities. No grossly apparent focal neurologic deficits.  Psych: Thought process is linear, logical, and goal-directed.   Labs: Component Ref Range & Units 10:03  (02/17/21) 05:49  (02/17/21) 02:06  (02/17/21) 1 d ago  (02/16/21) 1 d ago  (02/16/21) 1 d ago  (02/16/21) 1 d ago  (02/16/21)  Sodium 135 - 145 mmol/L 122Low  122Low  123Low  119Low Panic CM  118Low Panic CM  122Low  124Low   Potassium 3.5 - 5.1 mmol/L 3.2Low  4.3  3.9  3.9  4.0  3.9  3.7   Comment: DELTA CHECK NOTED  Chloride 98 - 111 mmol/L 100  97Low  98  97Low  95Low  94Low  96Low   CO2 22 - 32 mmol/L 15Low  14Low  18Low  16Low  15Low  19Low  21Low   Glucose, Bld 70 - 99 mg/dL 122High  94 CM  102High CM  111High CM  151High CM  110High CM  93 CM   Comment: Glucose reference range applies only to samples taken after fasting for at least 8 hours.  BUN 8 - 23 mg/dL 7Low  8  8  9  9  9   8  Creatinine, Ser 0.44 - 1.00 mg/dL 0.67  0.61  0.68  0.68  0.77  0.76  0.77   Calcium 8.9 - 10.3 mg/dL 7.5Low  7.4Low  7.6Low  7.4Low  7.7Low  8.3Low  8.0Low   GFR, Estimated >60 mL/min >60  >60 CM  >60 CM  >60 CM  >60 CM  >60 CM  >60 CM   Comment: (NOTE)  Calculated using the CKD-EPI Creatinine Equation (2021)   Anion gap 5 - 15 7  11  CM  7 CM  6 CM  8 CM  9 CM  7 CM    CBC Latest Ref Rng & Units 02/17/2021 02/16/2021 02/15/2021  WBC 4.0 - 10.5 K/uL 27.5(H) 17.2(H) 15.1(H)  Hemoglobin 12.0 - 15.0 g/dL 10.8(L) 9.6(L) 10.3(L)  Hematocrit 36.0 - 46.0 % 31.0(L) 26.3(L) 28.9(L)  Platelets 150 - 400 K/uL 215 173 165    Ref Range & Units 15:14  Magnesium 1.7 - 2.4 mg/dL 1.7    Imaging: CT  Abdomen/pelvis with contrast 02/17/21 1. Diverticular abscess in the left lower quadrant and left hemipelvis, measuring approximately 9 cm in length by 3 cm in diameter. 2. Heterogeneous liver perfusion, likely cardiac and congestive. There are now moderate pleural effusions with bilateral lower lobe Atelectasis.  Echocardiogram 5/25 1. Left ventricular ejection fraction, by estimation, is 60 to 65%. The left ventricle has normal function. The left ventricle has no regional wall motion abnormalities. Left ventricular diastolic function could not be evaluated. 2. Right ventricular systolic function is normal. The right ventricular size is normal. There is moderately elevated pulmonary artery systolic pressure. 3. Left atrial size was severely dilated. 4. Right atrial size was severely dilated. 5. The mitral valve is normal in structure. Moderate mitral valve regurgitation. No evidence of mitral stenosis. 6. Tricuspid valve regurgitation is moderate to severe. 7. The aortic valve is tricuspid. There is mild calcification of the aortic valve. Aortic valve regurgitation is moderate. Mild aortic valve sclerosis is present, with no evidence of aortic valve stenosis. 8. The inferior vena cava is dilated in size with >50% respiratory variability, suggesting right atrial pressure of 8 mmHg. Comparison: Prior images unable to be directly viewed, comparison made by report only. Multiple valve problems have worsened since 2016 and there is a marked increase in the severity of pulmonary hypertension.  Assessment/Plan:  Principal Problem:   Sepsis ([place]) Active Problems:   Urinary retention   Diverticular disease of intestine with perforation and abscess   Hyponatremia   Aortic atherosclerosis [facility])  Daisy Williams is a 87 year old lady with a history of Afib on Eliquis, hypertension, and glaucoma who was admitted for severe sepsis with perforated sigmoid diverticulitis with multiple abscesses  vs UTI as sources of infection.   #Sepsis secondary to perforated sigmoid diverticulitis with abscesses #Atelectasis  #UTI She had one episode of hypotension last night, pressures now stable s/p total of 3L 0.9% NS bolus. She remains afebrile, although her white count has notably increased from 17.2 to 27.5 yesterday despite continued IV Zosyn. Symptomatically, her abdominal pain is slightly improved although anorexia and generalized weakness persist. Her abdomen remains tender to moderate palpation of periumbilical region on physical exam, although not significantly worse than yesterday. She did have tender BL edema today, likely from volume overload given multiple IVF boluses overnight.  - CT abdomen was repeated which revealed increase in size of loculated sigmoid diverticular abscess, largest now 9cm x 3cm, and new BL lower lobe atelectasis.  - IR reviewed most recent images and  unfortunately cannot find safe window for percutaneous drainage of abscesses - Appreciate recommendations from surgery and palliative care. She has decided against surgery at this time. Palliative care planning on facilitating family goals of care meeting - Continue IV Zosyn for now - Blood cultures show no growth at 48 hours, final report pending - mIVF NS decreased to 168mL/hr given signs of hypervolemia  - Continue clear liquid diet for now - Trend CBC - Echocardiogram ordered to further evaluate borderline pressures with wide pulse pressure  #Hypotonic hypovolemic hyponatremia #Hypokalemia Serum sodium dropped to 118 overnight after gradual increase to 122. mIVF changed from LR to NS.  - mIVF NS 13mL/hr - Goal Na correction no greater than 4-6 mEq/day - Continue to closely monitor BMP - Most recent serum Na 122 - K low at 3.2. Will replete with IV potassium chloride 10Eq in 120mL IVPB q6hrs to achieve target K>4 - Mag 1.7, will replete with magnesium sulfate 2g in 32mL IVPB to achieve target Mag  >2  #Pulmonary hypertension Echo revealed LV EF 60-65% with normal right and left ventricular size and function, severely dilated R and L atria, moderate mitral valve regurgitation, moderate-severe tricuspid valve regurgitation, moderate aortic valve regurgitation, and right atrial pressure estimated at 34mmHg. Findings were consistent with pulmonary hypertension, increased in severity compared to 2016 echo. She does have LE edema on exam, although no RUQ abdominal tenderness. Overall, incidental finding on echo of unclear significance but will monitor for signs/symptoms.  - Carefully monitor breathing and volume status as we continue correcting hyponatremia with mIVF and address borderline pressures.    Code Status: DNR IVF: NS 134mL/hr Diet: clear liquids VTE ppx: SCD   LOS: 2 days   Theodosia Blender, Medical Student 02/17/2021, 3:45 PM

## 2021-02-17 NOTE — Plan of Care (Signed)

## 2021-02-17 NOTE — Progress Notes (Signed)
PT Cancellation Note  Patient Details Name: Kirstein Baxley MRN: [id number] DOB: 04-06-1928   Cancelled Treatment:    Reason Eval/Treat Not Completed: Patient at procedure or test/unavailable currently getting ECHO. Will attempt to return if time/schedule allow.    Windell Norfolk, DPT, PN1   Supplemental Physical Therapist [facility]    Pager [phone] Acute Rehab Office [phone]

## 2021-02-17 NOTE — Progress Notes (Signed)
Physical Therapy Treatment Patient Details Name: Daisy Williams MRN: [id number] DOB: 08/02/1928 Today's Date: 02/17/2021    History of Present Illness Pt is a 87 y.o. female admitted 02/15/21 with abdominal pain, nausea, decreased appetite. CT angio abdomen/pelvis showed bowel changes and abscesses likely secondary to perforated sigmoid colon diverticulitis. Workup for sepsis, diverticulitis, possible UTI. IR did not find safe window for percutaneous drainage. Plan for medical management. PMH includes glaucoma, inner ear dysfunction, afib on Eliquis.    PT Comments    Patient received in bed, pleasant and cooperative; family member present and observed session. Seemed a bit weaker today, and did need up to [facility] for assist with functional transfers as well as use of RW due to general BLE soreness and weakness- stated she really had to focus on not letting her knees buckle when walking. That being said, tolerated progression of mobility well today with VSS on RA. Able to toilet and perform selfcare with general S. Left up in recliner with all needs met, chair alarm active and family present. Will continue to follow.    Follow Up Recommendations  Home health PT;Supervision for mobility/OOB     Equipment Recommendations  Rolling walker with 5" wheels    Recommendations for Other Services       Precautions / Restrictions Precautions Precautions: Fall Restrictions Weight Bearing Restrictions: No    Mobility  Bed Mobility Overal bed mobility: Needs Assistance Bed Mobility: Supine to Sit     Supine to sit: Min guard     General bed mobility comments: min guard for safety/assist for line management, increased time and effort    Transfers Overall transfer level: Needs assistance Equipment used: Rolling walker (2 wheeled) Transfers: Sit to/from Stand Sit to Stand: Min assist         General transfer comment: MinA to boost up to full standing position, increased time and effort,  cues for hand placement and sequencing  Ambulation/Gait Ambulation/Gait assistance: Min guard Gait Distance (Feet): 110 Feet Assistive device: Rolling walker (2 wheeled) Gait Pattern/deviations: Step-through pattern;Decreased stride length;Trunk flexed Gait velocity: Decreased   General Gait Details: only needed min guard with use of RW, self selects flexed posture. VSS on RA.   Stairs             Wheelchair Mobility    Modified Rankin (Stroke Patients Only)       Balance Overall balance assessment: Needs assistance Sitting-balance support: No upper extremity supported;Feet supported Sitting balance-Leahy Scale: Fair     Standing balance support: Bilateral upper extremity supported;During functional activity Standing balance-Leahy Scale: Fair Standing balance comment: benefits from BUE support                            Cognition Arousal/Alertness: Awake/alert Behavior During Therapy: WFL for tasks assessed/performed Overall Cognitive Status: Within Functional Limits for tasks assessed                                        Exercises      General Comments General comments (skin integrity, edema, etc.): VSS on RA      Pertinent Vitals/Pain Pain Assessment: Faces Faces Pain Scale: Hurts a little bit Pain Location: generalized with mobility Pain Descriptors / Indicators: Discomfort Pain Intervention(s): Limited activity within patient's tolerance;Repositioned    Home Living  Prior Function            PT Goals (current goals can now be found in the care plan section) Acute Rehab PT Goals Patient Stated Goal: Return home ("I hope I never have to use anything to help me walk") PT Goal Formulation: With patient Time For Goal Achievement: 03/02/21 Potential to Achieve Goals: Good Progress towards PT goals: Progressing toward goals    Frequency    Min 3X/week      PT Plan Equipment  recommendations need to be updated    Co-evaluation              AM-PAC PT "6 Clicks" Mobility   Outcome Measure  Help needed turning from your back to your side while in a flat bed without using bedrails?: None Help needed moving from lying on your back to sitting on the side of a flat bed without using bedrails?: A Little Help needed moving to and from a bed to a chair (including a wheelchair)?: A Little Help needed standing up from a chair using your arms (e.g., wheelchair or bedside chair)?: A Little Help needed to walk in hospital room?: A Little Help needed climbing 3-5 steps with a railing? : A Little 6 Click Score: 19    End of Session Equipment Utilized During Treatment: Gait belt Activity Tolerance: Patient tolerated treatment well Patient left: in chair;with call bell/phone within reach;with chair alarm set;with family/visitor present Nurse Communication: Mobility status PT Visit Diagnosis: Other abnormalities of gait and mobility (R26.89);Muscle weakness (generalized) (M62.81)     Time: 2194-7125 PT Time Calculation (min) (ACUTE ONLY): 31 min  Charges:  $Gait Training: 8-22 mins $Therapeutic Activity: 8-22 mins                     Windell Norfolk, DPT, PN1   Supplemental Physical Therapist [place]    Pager [phone] Acute Rehab Office [phone]

## 2021-02-17 NOTE — Progress Notes (Signed)
Subjective/Chief Complaint: No ab pain, tol clears   Objective: Vital signs in last 24 hours: Temp:  [97.5 F (36.4 C)-98.5 F (36.9 C)] 97.9 F (36.6 C) (05/25 0812) Pulse Rate:  [54-90] 79 (05/25 0600) Resp:  [0-24] 20 (05/25 0812) BP: (99-157)/(26-54) 153/41 (05/25 0812) SpO2:  [95 %-100 %] 100 % (05/25 0812) Last BM Date: 02/16/21  Intake/Output from previous day: 05/24 0701 - 05/25 0700 In: 1117.8 [I.V.:1038.7; IV Piggyback:79.1] Out: 5400 [Urine:1250; Stool:1] Intake/Output this shift: No intake/output data recorded.  GI: mild distended nontender tympanitic  Lab Results:  Recent Labs    02/16/21 0025 02/17/21 0555  WBC 17.2* 27.5*  HGB 9.6* 10.8*  HCT 26.3* 31.0*  PLT 173 215   BMET Recent Labs    02/17/21 0549 02/17/21 1003  NA 122* 122*  K 4.3 3.2*  CL 97* 100  CO2 14* 15*  GLUCOSE 94 122*  BUN 8 7*  CREATININE 0.61 0.67  CALCIUM 7.4* 7.5*   PT/INR Recent Labs    02/16/21 0025  LABPROT 21.1*  INR 1.8*   ABG No results for input(s): PHART, HCO3 in the last 72 hours.  Invalid input(s): PCO2, PO2  Studies/Results: CT Angio Abd/Pel W and/or Wo Contrast  Result Date: 02/15/2021 CLINICAL DATA:  Mesenteric ischemia Acute atrial fibrillation Elevated lactic acid Abdominal pain EXAM: CTA ABDOMEN AND PELVIS WITHOUT AND WITH CONTRAST TECHNIQUE: Multidetector CT imaging of the abdomen and pelvis was performed using the standard protocol during bolus administration of intravenous contrast. Multiplanar reconstructed images and MIPs were obtained and reviewed to evaluate the vascular anatomy. CONTRAST:  30mL OMNIPAQUE IOHEXOL 350 MG/ML SOLN COMPARISON:  None. FINDINGS: VASCULAR Aorta: Diffuse atherosclerotic changes of the abdominal aorta with mild ectasia of the infrarenal segment measuring up to 1.4 cm. Celiac: No significant stenosis of the celiac artery. SMA: No significant stenosis. The right hepatic artery is replaced, originating from the SMA.  Renals: No significant stenosis. IMA: Patent. Inflow: Mild diffuse narrowing of the common iliac arteries due to diffusely calcified plaque. Bilateral internal iliac arteries are patent. No significant narrowing of the external iliac arteries. Proximal Outflow: Bilateral common femoral and visualized portions of the superficial and profunda femoral arteries are patent without evidence of aneurysm, dissection, vasculitis or significant stenosis. Veins: Hepatic, portal, splenic, superior mesenteric veins are patent. Review of the MIP images confirms the above findings. NON-VASCULAR Lower chest: Trace right pleural effusion.  Bibasilar atelectasis. Hepatobiliary: Diffuse heterogeneous enhancement of the liver without discrete mass or biliary dilatation. Is gallbladder is collapsed. Pancreas: Unremarkable. No pancreatic ductal dilatation or surrounding inflammatory changes. Spleen: Normal in size without focal abnormality. Adrenals/Urinary Tract: Adrenal glands are normal. Subcentimeter renal hypodensities are too small to fully characterize, however statistically most likely represent small simple cysts. No hydronephrosis or hydroureter. Bladder is normal in appearance. Stomach/Bowel: No bowel dilatation to indicate ileus or obstruction. Appendix is normal. There is diverticulosis of the descending and sigmoid colon. There is acute inflammation of the sigmoid colon likely due to diverticulitis. Multiple foci of extraluminal air is noted. There is extensive stranding of the Peri sigmoid colon fat. There are multiple collections of fluid/air with the largest located in the left lateral pelvis measuring 5.9 x 1.9 x 2.2 cm which is suspicious for abscess. Multiple additional smaller foci of air and fluid also noted within the pelvis. Lymphatic: No enlarged lymph nodes. Reproductive: Evaluation of the pelvic organs is difficult as the uterus and ovaries are not discernible independent of the inflammatory changes. Other:  Small ovoid fluid collection noted in the left anterior pelvis subcutaneous tissues measuring 2.2 x 2.1 x 1.3 cm. This may be an additional small abscess. Musculoskeletal: No acute or significant osseous findings. IMPRESSION: VASCULAR No significant stenosis of the mesenteric arteries or veins. NON-VASCULAR 1. Marked inflammatory changes of the pelvis, likely due to perforated sigmoid colon diverticulitis. Multiple small collections of fluid and air suspicious for abscesses. The largest is located in the left lateral pelvis measuring 5.9 x 1.9 x 2.2 cm. Small amounts of extraluminal air noted within the pelvis indicative of perforation. 2. Diffuse heterogeneous enhancement of the liver without discrete mass. Findings are nonspecific, however most likely due to vascular congestion or hepatitis. These results were called by telephone at the time of interpretation on 02/15/2021 at 1:55 pm to provider [place] , who verbally acknowledged these results. Electronically Signed   By: Miachel Roux M.D.   On: 02/15/2021 13:55    Anti-infectives: Anti-infectives (From admission, onward)   Start     Dose/Rate Route Frequency Ordered Stop   02/15/21 1500  piperacillin-tazobactam (ZOSYN) IVPB 3.375 g        3.375 g 12.5 mL/hr over 240 Minutes Intravenous Every 8 hours 02/15/21 1412     02/15/21 1230  ceFEPIme (MAXIPIME) 2 g in sodium chloride 0.9 % 100 mL IVPB  Status:  Discontinued        2 g 200 mL/hr over 30 Minutes Intravenous  Once 02/15/21 1221 02/15/21 1223   02/15/21 1230  metroNIDAZOLE (FLAGYL) IVPB 500 mg        500 mg 100 mL/hr over 60 Minutes Intravenous  Once 02/15/21 1221 02/15/21 1409   02/15/21 1230  ceFEPIme (MAXIPIME) 2 g in sodium chloride 0.9 % 100 mL IVPB  Status:  Discontinued        2 g 200 mL/hr over 30 Minutes Intravenous Every 12 hours 02/15/21 1223 02/15/21 1412      Assessment/Plan: HTN A. Fib on eliquis- last dose yesterday morning Postural dizziness Glaucoma   Hyponatremia/hypokalemia - per medicine for replacement Possible UTI - UA with +leukocytes and many bacteria, cx ordered  Hyperbilirubinemia - unclear etiology.  Other LFTs unremarkable.  Hepatitis panel is normal.  Perforated sigmoid diverticulitis with abscesses - abscess noted on scan unable to be drained by IR -cont zosyn for now -WBC up again but clinically appears well- will repeat ct scan today -will allow CLD today and follow. -no surgical plans at this time.  Hopefully patient will gradually improve and not require further intervention. -I discussed surgery with her today and she does not think she would want to undergo surgery, have asked palliative to see and will await ct scan to see if worse, maybe IR drain possible FEN: CLD, IVF VTE: SCDs, hold eliquis ID: [email or web address] 5/23; Zosyn 5/23>>  Rolm Bookbinder 02/17/2021

## 2021-02-17 NOTE — Progress Notes (Signed)
Interventional Radiology Progress Note  87 yo female with diverticular abscess.   Contacted about repeat CT performed today.   The abscess remains multi-loculated, and is located deep to dilated bowel.  Current CT shows no safe window for percutaneous access.    Call with questions/concerns.   Signed,  Dulcy Fanny. Earleen [place], DO

## 2021-02-17 NOTE — Consult Note (Signed)
Consultation Note Date: 02/17/2021   Patient Name: Daisy Williams  DOB: Sep 06, 1928  MRN: [id number]  Age / Sex: 87 y.o., female  PCP: Leonard Downing, MD Referring Physician: Aldine Contes, MD  Reason for Consultation: Establishing goals of care  HPI/Patient Profile: 87 y.o. female  with past medical history of Afib on Eliquis, hypertension, postural dizziness, and glaucoma admitted on 02/15/2021 with several days of abdominal pain with nausea and anorexia. Patient diagnosed with sepsis secondary to perforated sigmoid diverticulitis with abscesses. Patient on IV antibiotics. IR unable to safely drain abscesses. WBC count increased but abd pain improved. Repeat CT abd 5/25 shows unchanged abscesses. Patient has stated she would like to avoid surgery. PMT consulted for further goals of care conversations.   Clinical Assessment and Goals of Care: I have reviewed medical records including EPIC notes, labs and imaging, assessed the patient and then met with patient and her grandson Audelia Acton to discuss diagnosis prognosis, GOC, EOL wishes, disposition and options.  I introduced Palliative Medicine as specialized medical care for people living with serious illness. It focuses on providing relief from the symptoms and stress of a serious illness. The goal is to improve quality of life for both the patient and the family.  We discussed a brief life review of the patient. She shares she has been married to her spouse Jeneen Rinks for 65 years - this is her second marriage. She has 2 children from her previous marriage.   As far as functional and nutritional status, she reports she was independent at home. Her appetite had decreased some and she had started having some trouble consuming meat. No coughing with liquids. Family notes some mild forgetfulness at home.    We discussed patient's current illness and what it means in the larger context of patient's  on-going co-morbidities.  Natural disease trajectory and expectations at EOL were discussed. We discussed her perforated sigmoid diverticulitis with abscesses and current treatment with IV antibiotics. We discuss hope for improvement without need for further intervention. We discuss that her scan today was unchanged and there remains no safe window to drain abscesses. We discuss option for surgery if she does not improve. She is quick to tell me she would not want surgery. We discuss this further in multiple different ways. She continues to be clear that she does not want surgery, even if that means she gets sicker and we shift to comfort measures only/hospice support at home. Her grandson is present for all of this and asks questions to me and patient. Everyone has good understanding of situation and her wishes.   I attempted to elicit values and goals of care important to the patient. Patient tells me what is most important to her is being at home - especially if she is at end of life.    I offer to call patient's other family members but grandson feels they will best understand situation in person from him. We discuss continuing current measures for now and see how patient does. We discuss that palliative team will remain involved to continue to facilitate conversation surrounding her goals of care.  The difference between aggressive medical intervention and comfort care was considered in light of the patient's goals of care.   Discussed with patient/family the importance of continued conversation with family and the medical providers regarding overall plan of care and treatment options, ensuring decisions are within the context of the patient's values and GOCs.    Hospice services outpatient were explained and offered. We  also discussed home health services. Will determine which is more appropriate in coming days.  Yolanda Bonine shares with me privately that he supports the patient's wishes though he does  feel torn - part of him wants to encourage her to have surgery. We discuss uncertain outcome if we did proceed with surgery. Yolanda Bonine shares patient's sister died following a bowel surgery so he feels that is adding to patient's feelings about current situation.   Discussed with grandson that depending on how hospitalization goes, we can hold a family meeting to include patient's spouse and children.  Questions and concerns were addressed. The family was encouraged to call with questions or concerns.   Primary Decision Maker PATIENT   SUMMARY OF RECOMMENDATIONS   - patient insists she does not want surgery even if she is not improving and appears to be nearing end of life - she would prefer to go home with hospice - depending on how hospitalization goes, may need to hold family meeting to include her spouse and children - continue current measures for now, time for outcomes - PMT will continue to follow and assist with [place]  Code Status/Advance Care Planning:  DNR  Prognosis:   Unable to determine  Discharge Planning: To Be Determined      Primary Diagnoses: Present on Admission: . Sepsis ([place]) . Aortic atherosclerosis ([place])   I have reviewed the medical record, interviewed the patient and family, and examined the patient. The following aspects are pertinent.  Past Medical History:  Diagnosis Date  . Glaucoma   . Hematuria    Patient reports PCP has followed, improved since using cranberry juice  . Inner ear dysfunction   . Melanoma ([place])    Skin cancer removal   Social History   Socioeconomic History  . Marital status: Married    Spouse name: Not on file  . Number of children: 2  . Years of education: 6th   . Highest education level: Not on file  Occupational History  . Occupation: Publishing copy  Tobacco Use  . Smoking status: Never Smoker  . Smokeless tobacco: Never Used  Substance and Sexual Activity  . Alcohol use: No    Alcohol/week:  0.0 standard drinks  . Drug use: No  . Sexual activity: Not on file  Other Topics Concern  . Not on file  Social History Narrative  . Not on file   Social Determinants of Health   Financial Resource Strain: Not on file  Food Insecurity: Not on file  Transportation Needs: Not on file  Physical Activity: Not on file  Stress: Not on file  Social Connections: Not on file   Family History  Problem Relation Age of Onset  . Colon cancer Mother   . Heart disease Father        Father passed away at 33. Was taking heart medicine - "didn't last a month after they put him on it."  . Heart disease Brother        Brother was on heart medicine. He went off his heart medicine and died shortly after - age 97. (heart attack?)  . Other Sister        Sister was a nursing home patient and passed away at 47 of unknown cause.   Scheduled Meds: . brimonidine  1 drop Both Eyes TID  . dorzolamide-timolol  1 drop Both Eyes BID  . latanoprost  1 drop Both Eyes QHS  . sodium chloride flush  3 mL Intravenous Q12H  Continuous Infusions: . sodium chloride 150 mL/hr at 02/17/21 0653  . piperacillin-tazobactam (ZOSYN)  IV 3.375 g (02/17/21 0651)  . potassium chloride 10 mEq (02/17/21 1155)   PRN Meds:.acetaminophen **OR** acetaminophen, ondansetron **OR** ondansetron (ZOFRAN) IV No Known Allergies Review of Systems  Constitutional: Positive for activity change and appetite change.    Physical Exam Constitutional:      General: She is not in acute distress. Pulmonary:     Effort: Pulmonary effort is normal.  Skin:    General: Skin is warm and dry.  Neurological:     Mental Status: She is alert and oriented to person, place, and time.  Psychiatric:        Mood and Affect: Mood normal.        Behavior: Behavior normal.     Vital Signs: BP (!) 156/46 (BP Location: Left Arm)   Pulse 79   Temp 97.6 F (36.4 C) (Oral)   Resp 19   Ht 5' 3" (1.6 m)   Wt 51.7 kg   SpO2 94%   BMI 20.19 kg/m   Pain Scale: 0-10   Pain Score: 0-No pain   SpO2: SpO2: 94 % O2 Device:SpO2: 94 % O2 Flow Rate: .   IO: Intake/output summary:   Intake/Output Summary (Last 24 hours) at 02/17/2021 1243 Last data filed at 02/17/2021 0600 Gross per 24 hour  Intake 1117.84 ml  Output 951 ml  Net 166.84 ml    LBM: Last BM Date: 02/16/21 Baseline Weight: Weight: 51.7 kg Most recent weight: Weight: 51.7 kg     Palliative Assessment/Data: PPS 60%    Time Total: 85 minutes Greater than 50%  of this time was spent counseling and coordinating care related to the above assessment and plan.  Juel Burrow, DNP, AGNP-C Palliative Medicine Team [phone] Pager: [phone]

## 2021-02-18 DIAGNOSIS — N179 Acute kidney failure, unspecified: Secondary | ICD-10-CM

## 2021-02-18 LAB — CBC WITH DIFFERENTIAL/PLATELET
Abs Immature Granulocytes: 4.13 10*3/uL — ABNORMAL HIGH (ref 0.00–0.07)
Basophils Absolute: 0 10*3/uL (ref 0.0–0.1)
Basophils Relative: 0 %
Eosinophils Absolute: 0.1 10*3/uL (ref 0.0–0.5)
Eosinophils Relative: 1 %
HCT: 30.7 % — ABNORMAL LOW (ref 36.0–46.0)
Hemoglobin: 10.6 g/dL — ABNORMAL LOW (ref 12.0–15.0)
Immature Granulocytes: 15 %
Lymphocytes Relative: 2 %
Lymphs Abs: 0.5 10*3/uL — ABNORMAL LOW (ref 0.7–4.0)
MCH: 38.5 pg — ABNORMAL HIGH (ref 26.0–34.0)
MCHC: 34.5 g/dL (ref 30.0–36.0)
MCV: 111.6 fL — ABNORMAL HIGH (ref 80.0–100.0)
Monocytes Absolute: 1.4 10*3/uL — ABNORMAL HIGH (ref 0.1–1.0)
Monocytes Relative: 5 %
Neutro Abs: 21.5 10*3/uL — ABNORMAL HIGH (ref 1.7–7.7)
Neutrophils Relative %: 77 %
Platelets: 300 10*3/uL (ref 150–400)
RBC: 2.75 MIL/uL — ABNORMAL LOW (ref 3.87–5.11)
RDW: 13.9 % (ref 11.5–15.5)
WBC: 27.6 10*3/uL — ABNORMAL HIGH (ref 4.0–10.5)
nRBC: 0 % (ref 0.0–0.2)

## 2021-02-18 LAB — BASIC METABOLIC PANEL
Anion gap: 7 (ref 5–15)
Anion gap: 9 (ref 5–15)
Anion gap: 8 (ref 5–15)
Anion gap: 8 (ref 5–15)
BUN: 5 mg/dL — ABNORMAL LOW (ref 8–23)
BUN: 5 mg/dL — ABNORMAL LOW (ref 8–23)
BUN: 5 mg/dL — ABNORMAL LOW (ref 8–23)
BUN: 5 mg/dL — ABNORMAL LOW (ref 8–23)
CO2: 14 mmol/L — ABNORMAL LOW (ref 22–32)
CO2: 15 mmol/L — ABNORMAL LOW (ref 22–32)
CO2: 15 mmol/L — ABNORMAL LOW (ref 22–32)
CO2: 16 mmol/L — ABNORMAL LOW (ref 22–32)
Calcium: 7.4 mg/dL — ABNORMAL LOW (ref 8.9–10.3)
Calcium: 7.7 mg/dL — ABNORMAL LOW (ref 8.9–10.3)
Calcium: 7.5 mg/dL — ABNORMAL LOW (ref 8.9–10.3)
Calcium: 7.9 mg/dL — ABNORMAL LOW (ref 8.9–10.3)
Chloride: 101 mmol/L (ref 98–111)
Chloride: 101 mmol/L (ref 98–111)
Chloride: 102 mmol/L (ref 98–111)
Chloride: 101 mmol/L (ref 98–111)
Creatinine, Ser: 0.63 mg/dL (ref 0.44–1.00)
Creatinine, Ser: 0.61 mg/dL (ref 0.44–1.00)
Creatinine, Ser: 0.62 mg/dL (ref 0.44–1.00)
Creatinine, Ser: 0.64 mg/dL (ref 0.44–1.00)
GFR, Estimated: >60 mL/min (ref >60)
GFR, Estimated: >60 mL/min (ref >60)
GFR, Estimated: >60 mL/min (ref >60)
GFR, Estimated: >60 mL/min (ref >60)
Glucose, Bld: 98 mg/dL (ref 70–99)
Glucose, Bld: 95 mg/dL (ref 70–99)
Glucose, Bld: 112 mg/dL — ABNORMAL HIGH (ref 70–99)
Glucose, Bld: 98 mg/dL (ref 70–99)
Potassium: 3.6 mmol/L (ref 3.5–5.1)
Potassium: 3.6 mmol/L (ref 3.5–5.1)
Potassium: 3.6 mmol/L (ref 3.5–5.1)
Potassium: 4 mmol/L (ref 3.5–5.1)
Sodium: 122 mmol/L — ABNORMAL LOW (ref 135–145)
Sodium: 125 mmol/L — ABNORMAL LOW (ref 135–145)
Sodium: 125 mmol/L — ABNORMAL LOW (ref 135–145)
Sodium: 125 mmol/L — ABNORMAL LOW (ref 135–145)

## 2021-02-18 LAB — MAGNESIUM: Magnesium: 2.2 mg/dL (ref 1.7–2.4)

## 2021-02-18 MED ORDER — HYDRALAZINE HCL 20 MG/ML IJ SOLN
5.0000 mg | Freq: Once | INTRAMUSCULAR | Status: AC | PRN
  Administered 2021-02-21: 5 mg via INTRAVENOUS
  Filled 2021-02-18: qty 1

## 2021-02-18 MED ORDER — POTASSIUM CHLORIDE 10 MEQ/100ML IV SOLN
10.0000 meq | INTRAVENOUS | Status: AC
  Administered 2021-02-18 (×4): 10 meq via INTRAVENOUS
  Filled 2021-02-18 (×4): qty 100

## 2021-02-18 NOTE — Care Management Important Message (Signed)
Important Message  Patient Details  Name: Daisy Williams MRN: [id number] Date of Birth: [birth year]   Medicare Important Message Given:  Yes     Amenda Duclos 02/18/2021, 2:50 PM

## 2021-02-18 NOTE — Progress Notes (Signed)
Occupational Therapy Treatment Patient Details Name: Keymiah Lyles MRN: [id number] DOB: [birth year] Today's Date: 02/18/2021    History of present illness Pt is a 87 y.o. female admitted 02/15/21 with abdominal pain, nausea, decreased appetite. CT angio abdomen/pelvis showed bowel changes and abscesses likely secondary to perforated sigmoid colon diverticulitis. Workup for sepsis, diverticulitis, possible UTI. IR did not find safe window for percutaneous drainage. Plan for medical management. PMH includes glaucoma, inner ear dysfunction, afib on Eliquis.   OT comments  Pt making progress with functional goals; pt's daughter present this session. Due to pt's fluctuating HRof 109-140s with minimal exertion/activty session limited and focused on sitting EOB standing at RW for dressing and peri hygiene tasks, the ambulation to recliner. Pt's RN notified of BP an HR. OT will continue to follow acutely to maximize level of function and safety VS as follows: Supine before activity: BP 164/59, O2 97%, HR 109 (when pt began talking on phone HR began to fluctuate 109-120s) Sitting EOB: BP 166/56, O2 97%, HR 111-130s Standing for dressing and hygiene tasks HR 119-140s, BP 123/60, O2 96% Seated in recliner HR 111-123, O2 97%, BP 109/112   Follow Up Recommendations  Home health OT;Supervision - Intermittent    Equipment Recommendations  Tub/shower seat    Recommendations for Other Services      Precautions / Restrictions Precautions Precautions: Fall Restrictions Weight Bearing Restrictions: No       Mobility Bed Mobility Overal bed mobility: Needs Assistance Bed Mobility: Supine to Sit     Supine to sit: Min guard     General bed mobility comments: min guard for safety/assist for line management, increased time and effort    Transfers Overall transfer level: Needs assistance Equipment used: Rolling walker (2 wheeled) Transfers: Sit to/from Stand Sit to Stand: Min assist          General transfer comment: min A transfer to recliner from bed. further mobility and acitivty deferrd due to HR 109-140 with minmal exertion    Balance Overall balance assessment: Needs assistance Sitting-balance support: No upper extremity supported;Feet supported Sitting balance-Leahy Scale: Fair     Standing balance support: Bilateral upper extremity supported;During functional activity Standing balance-Leahy Scale: Fair                             ADL either performed or assessed with clinical judgement   ADL Overall ADL's : Needs assistance/impaired     Grooming: Wash/dry hands;Wash/dry face;Min guard;Standing       Lower Body Bathing: Minimal assistance;Sit to/from stand           Toilet Transfer: Ambulation;Stand-pivot;Minimal assistance;Min Psychiatric nurse Details (indicate cue type and reason): simulated to recliner Toileting- Clothing Manipulation and Hygiene: Sit to/from stand;Min guard       Functional mobility during ADLs: Minimal assistance;Min guard;Rolling walker General ADL Comments: Pt sat EOB the stood EOB for peri hygiene after urination (purewick leaking and bed pads wet with urine), after doffing dirty gown and donnig clena gown, pt ambulated to recliner and further acitivtu derred due to increasinh HR 109 at rest to 140 with minima exertion (acutally fluctuating 109-140)     Vision Baseline Vision/History: Wears glasses Patient Visual Report: No change from baseline     Perception     Praxis      Cognition Arousal/Alertness: Awake/alert Behavior During Therapy: WFL for tasks assessed/performed Overall Cognitive Status: Within Functional Limits for tasks assessed  Exercises     Shoulder Instructions       General Comments      Pertinent Vitals/ Pain       Pain Assessment: No/denies pain Pain Score: 0-No pain Pain Intervention(s): Monitored during  session;Repositioned  Home Living                                          Prior Functioning/Environment              Frequency  Min 2X/week        Progress Toward Goals  OT Goals(current goals can now be found in the care plan section)  Progress towards OT goals: Progressing toward goals     Plan Discharge plan remains appropriate    Co-evaluation                 AM-PAC OT "6 Clicks" Daily Activity     Outcome Measure   Help from another person eating meals?: None Help from another person taking care of personal grooming?: A Little Help from another person toileting, which includes using toliet, bedpan, or urinal?: A Little Help from another person bathing (including washing, rinsing, drying)?: A Little Help from another person to put on and taking off regular upper body clothing?: None Help from another person to put on and taking off regular lower body clothing?: A Little 6 Click Score: 20    End of Session Equipment Utilized During Treatment: Gait belt;Other (comment) (RW)  OT Visit Diagnosis: Unsteadiness on feet (R26.81);Other abnormalities of gait and mobility (R26.89);Muscle weakness (generalized) (M62.81)   Activity Tolerance Patient tolerated treatment well   Patient Left in chair;with call bell/phone within reach;with chair alarm set;with family/visitor present   Nurse Communication          Time: 5366-4403 OT Time Calculation (min): 33 min  Charges: OT General Charges $OT Visit: 1 Visit OT Treatments $Self Care/Home Management : 8-22 mins $Therapeutic Activity: 8-22 mins     Britt Bottom 02/18/2021, 2:37 PM

## 2021-02-18 NOTE — Progress Notes (Signed)
Subjective/Chief Complaint: No abd pain.  Feels stronger today.  Tolerating CLD well yesterday.   Objective: Vital signs in last 24 hours: Temp:  [97.6 F (36.4 C)-98.4 F (36.9 C)] 98.4 F (36.9 C) (05/26 0800) Pulse Rate:  [88-108] 108 (05/26 0800) Resp:  [17-20] 18 (05/26 0800) BP: (116-175)/(46-74) 175/66 (05/26 0800) SpO2:  [94 %-97 %] 95 % (05/26 0800) Weight:  [51.7 kg] 51.7 kg (05/26 0500) Last BM Date: 02/17/21  Intake/Output from previous day: 05/25 0701 - 05/26 0700 In: 2146.2 [I.V.:1494; IV Piggyback:652.2] Out: -  Intake/Output this shift: No intake/output data recorded.  Heart: regular Lungs: CTAB Abd: soft, NT, less distended today, +BS  Lab Results:  Recent Labs    02/17/21 0555 02/18/21 0541  WBC 27.5* 27.6*  HGB 10.8* 10.6*  HCT 31.0* 30.7*  PLT 215 300   BMET Recent Labs    02/18/21 0239 02/18/21 0541  NA 122* 125*  K 3.6 3.6  CL 101 101  CO2 14* 15*  GLUCOSE 98 95  BUN 5* 5*  CREATININE 0.63 0.61  CALCIUM 7.4* 7.7*   PT/INR Recent Labs    02/16/21 0025  LABPROT 21.1*  INR 1.8*   ABG No results for input(s): PHART, HCO3 in the last 72 hours.  Invalid input(s): PCO2, PO2  Studies/Results: CT ABDOMEN PELVIS W CONTRAST  Result Date: 02/17/2021 CLINICAL DATA:  Diverticulitis with complication suspected. EXAM: CT ABDOMEN AND PELVIS WITH CONTRAST TECHNIQUE: Multidetector CT imaging of the abdomen and pelvis was performed using the standard protocol following bolus administration of intravenous contrast. CONTRAST:  61mL OMNIPAQUE IOHEXOL 300 MG/ML  SOLN COMPARISON:  Two days ago FINDINGS: Lower chest: Moderate layering pleural effusions with lower lobe atelectasis. Hepatobiliary: Heterogeneous liver perfusion, suspect congestion given nutmeg appearance in this patient with dilated atria.No evidence of biliary obstruction or stone. Pancreas: Generalized atrophy.  No acute finding. Spleen: Unremarkable. Adrenals/Urinary Tract:  Negative adrenals. No hydronephrosis or stone. Unremarkable bladder. Stomach/Bowel: Sigmoid diverticulitis with perforation that is contiguous with a gas and fluid rim enhancing collection extending inferiorly and leftward and measuring up to 5.8 x 2.3 cm at its largest along the left pelvic sidewall. There is a superiorly extending component which measures approximately 3 cm. In total the collection appears continuous with a 9 cm craniocaudal span. No pneumoperitoneum. Vascular/Lymphatic: Extensive atheromatous calcification of the aorta, iliacs, and branch vessels. No mass or adenopathy. Reproductive:No acute finding. Other: No ascites or pneumoperitoneum. Musculoskeletal: No acute abnormalities. Generalized spinal degeneration IMPRESSION: 1. Unchanged diverticular abscess in the left lower quadrant and left hemipelvis, measuring approximately 9 cm in length by 3 cm in diameter. 2. Heterogeneous liver perfusion, likely cardiac and congestive. There are now moderate pleural effusions with bilateral lower lobe atelectasis. Electronically Signed   By: Monte Fantasia M.D.   On: 02/17/2021 11:32   ECHOCARDIOGRAM COMPLETE  Result Date: 02/17/2021    ECHOCARDIOGRAM REPORT   Patient Name:   Daisy Williams Date of Exam: 02/17/2021 Medical Rec #:  [id number]       Height:       63.0 in Accession #:    [id number]      Weight:       114.0 lb Date of Birth:  1928-07-06        BSA:          1.523 m Patient Age:    87 years        BP:           157/51 mmHg Patient  Gender: F               HR:           79 bpm. Exam Location:  Inpatient Procedure: 2D Echo, Cardiac Doppler and Color Doppler Indications:    Aortic regurgitation  History:        Patient has prior history of Echocardiogram examinations, most                 recent 02/07/2015. Risk Factors:Hypertension.  Sonographer:    Cammy Brochure Referring Phys: [id number] NISCHAL NARENDRA IMPRESSIONS  1. Left ventricular ejection fraction, by estimation, is 60 to 65%. The left  ventricle has normal function. The left ventricle has no regional wall motion abnormalities. Left ventricular diastolic function could not be evaluated.  2. Right ventricular systolic function is normal. The right ventricular size is normal. There is moderately elevated pulmonary artery systolic pressure.  3. Left atrial size was severely dilated.  4. Right atrial size was severely dilated.  5. The mitral valve is normal in structure. Moderate mitral valve regurgitation. No evidence of mitral stenosis.  6. Tricuspid valve regurgitation is moderate to severe.  7. The aortic valve is tricuspid. There is mild calcification of the aortic valve. Aortic valve regurgitation is moderate. Mild aortic valve sclerosis is present, with no evidence of aortic valve stenosis.  8. The inferior vena cava is dilated in size with >50% respiratory variability, suggesting right atrial pressure of 8 mmHg. Comparison(s): Prior images unable to be directly viewed, comparison made by report only. Multiple valve problems have worsened since 2016 and there is a marked increase in the severity of pulmonary hypertension. FINDINGS  Left Ventricle: Left ventricular ejection fraction, by estimation, is 60 to 65%. The left ventricle has normal function. The left ventricle has no regional wall motion abnormalities. The left ventricular internal cavity size was normal in size. There is  no left ventricular hypertrophy. Left ventricular diastolic function could not be evaluated due to atrial fibrillation. Left ventricular diastolic function could not be evaluated. Right Ventricle: The right ventricular size is normal. No increase in right ventricular wall thickness. Right ventricular systolic function is normal. There is moderately elevated pulmonary artery systolic pressure. The tricuspid regurgitant velocity is 3.68 m/s, and with an assumed right atrial pressure of 8 mmHg, the estimated right ventricular systolic pressure is 01.7 mmHg. Left Atrium:  Left atrial size was severely dilated. Right Atrium: Right atrial size was severely dilated. Pericardium: There is no evidence of pericardial effusion. Mitral Valve: The mitral valve is normal in structure. Moderate mitral valve regurgitation, with centrally-directed jet. No evidence of mitral valve stenosis. Tricuspid Valve: The tricuspid valve is normal in structure. Tricuspid valve regurgitation is moderate to severe. No evidence of tricuspid stenosis. Aortic Valve: The aortic valve is tricuspid. There is mild calcification of the aortic valve. Aortic valve regurgitation is moderate. Mild aortic valve sclerosis is present, with no evidence of aortic valve stenosis. Aortic valve mean gradient measures 3.0 mmHg. Aortic valve peak gradient measures 5.4 mmHg. Aortic valve area, by VTI measures 2.74 cm. Pulmonic Valve: The pulmonic valve was not well visualized. Pulmonic valve regurgitation is not visualized. No evidence of pulmonic stenosis. Aorta: The aortic root is normal in size and structure. Venous: The inferior vena cava is dilated in size with greater than 50% respiratory variability, suggesting right atrial pressure of 8 mmHg. IAS/Shunts: No atrial level shunt detected by color flow Doppler.  LEFT VENTRICLE PLAX 2D LVIDd:  4.60 cm LVIDs:         2.80 cm LV PW:         1.00 cm LV IVS:        1.00 cm LVOT diam:     1.90 cm LV SV:         59 LV SV Index:   39 LVOT Area:     2.84 cm  RIGHT VENTRICLE             IVC RV Basal diam:  4.00 cm     IVC diam: 1.70 cm RV S prime:     10.10 cm/s LEFT ATRIUM             Index       RIGHT ATRIUM           Index LA diam:        3.80 cm 2.50 cm/m  RA Area:     22.80 cm LA Vol (A2C):   81.5 ml 53.52 ml/m RA Volume:   68.10 ml  44.72 ml/m LA Vol (A4C):   65.4 ml 42.95 ml/m LA Biplane Vol: 77.4 ml 50.83 ml/m  AORTIC VALVE AV Area (Vmax):    3.03 cm AV Area (Vmean):   2.76 cm AV Area (VTI):     2.74 cm AV Vmax:           116.33 cm/s AV Vmean:          81.467  cm/s AV VTI:            0.215 m AV Peak Grad:      5.4 mmHg AV Mean Grad:      3.0 mmHg LVOT Vmax:         124.33 cm/s LVOT Vmean:        79.167 cm/s LVOT VTI:          0.208 m LVOT/AV VTI ratio: 0.97  AORTA Ao Root diam: 3.00 cm Ao Asc diam:  2.50 cm MITRAL VALVE                TRICUSPID VALVE MV Area (PHT): 4.89 cm     TR Peak grad:   54.2 mmHg MV Decel Time: 155 msec     TR Vmax:        368.00 cm/s MV E velocity: 115.00 cm/s                             SHUNTS                             Systemic VTI:  0.21 m                             Systemic Diam: 1.90 cm Dani Gobble Croitoru MD Electronically signed by Sanda Klein MD Signature Date/Time: 02/17/2021/1:51:28 PM    Final     Anti-infectives: Anti-infectives (From admission, onward)   Start     Dose/Rate Route Frequency Ordered Stop   02/17/21 2315  piperacillin-tazobactam (ZOSYN) IVPB 3.375 g        3.375 g 12.5 mL/hr over 240 Minutes Intravenous Every 8 hours 02/17/21 2224     02/15/21 1500  piperacillin-tazobactam (ZOSYN) IVPB 3.375 g  Status:  Discontinued        3.375 g 12.5 mL/hr over 240 Minutes Intravenous Every 8 hours 02/15/21 1412 02/17/21  2224   02/15/21 1230  ceFEPIme (MAXIPIME) 2 g in sodium chloride 0.9 % 100 mL IVPB  Status:  Discontinued        2 g 200 mL/hr over 30 Minutes Intravenous  Once 02/15/21 1221 02/15/21 1223   02/15/21 1230  metroNIDAZOLE (FLAGYL) IVPB 500 mg        500 mg 100 mL/hr over 60 Minutes Intravenous  Once 02/15/21 1221 02/15/21 1409   02/15/21 1230  ceFEPIme (MAXIPIME) 2 g in sodium chloride 0.9 % 100 mL IVPB  Status:  Discontinued        2 g 200 mL/hr over 30 Minutes Intravenous Every 12 hours 02/15/21 1223 02/15/21 1412      Assessment/Plan: HTN A. Fib on eliquis- last dose yesterday morning Postural dizziness Glaucoma  Hyponatremia/hypokalemia - per medicine for replacement Possible UTI - UA with +leukocytes and many bacteria, cx ordered  Hyperbilirubinemia - unclear etiology.  Other LFTs  unremarkable.  Hepatitis panel is normal.  Perforated sigmoid diverticulitis with abscesses - abscess noted on scan unable to be drained by IR -cont zosyn for now -WBC up again but clinically appears well and feels better -will allow FLD today and follow. -no surgical plans at this time as the patient has very clearly stated she would NOT want any type of operation. -appreciate palliative seeing the patient.  FEN: FLD, IVF VTE: SCDs, hold eliquis ID: [email or web address] 5/23; Zosyn 5/23>>  Henreitta Cea 02/18/2021

## 2021-02-18 NOTE — Progress Notes (Signed)
Subjective: No acute events overnight.   Mrs. Fincher was seen resting comfortably in her bed this morning, with daughter Rebekah Chesterfield at bedside. MD team explained hospital course and answered daughter's questions. Patient says she is feeling fine this morning, was able to drink some gingerale yesterday without nausea or emesis. Denies abdominal pain today. She says her legs feel more swollen today. Reports she was able to walk for 5 minutes with PT yesterday.   Objective:  Vital signs in last 24 hours: Vitals:   02/18/21 0800 02/18/21 0950 02/18/21 1000 02/18/21 1139  BP: (!) 175/66 (!) 174/73 (!) 167/67 (!) 117/53  Pulse: (!) 108 86 (!) 110 79  Resp: 18 18 20 20   Temp: 98.4 F (36.9 C) 98.4 F (36.9 C) 98.3 F (36.8 C) 98.2 F (36.8 C)  TempSrc: Oral Oral Oral Oral  SpO2: 95% 95% 96% 95%  Weight:      Height:       Weight change:   Intake/Output Summary (Last 24 hours) at 02/18/2021 1429 Last data filed at 02/18/2021 1034 Gross per 24 hour  Intake 2149.2 ml  Output --  Net 2149.2 ml   Labs: CMP Latest Ref Rng & Units 02/18/2021 02/18/2021 02/17/2021  Glucose 70 - 99 mg/dL 95 98 111(H)  BUN 8 - 23 mg/dL 5(L) 5(L) 6(L)  Creatinine 0.44 - 1.00 mg/dL 0.61 0.63 0.62  Sodium 135 - 145 mmol/L 125(L) 122(L) 120(L)  Potassium 3.5 - 5.1 mmol/L 3.6 3.6 3.7  Chloride 98 - 111 mmol/L 101 101 96(L)  CO2 22 - 32 mmol/L 15(L) 14(L) 17(L)  Calcium 8.9 - 10.3 mg/dL 7.7(L) 7.4(L) 7.3(L)  Total Protein 6.5 - 8.1 g/dL - - -  Total Bilirubin 0.3 - 1.2 mg/dL - - -  Alkaline Phos 38 - 126 U/L - - -  AST 15 - 41 U/L - - -  ALT 0 - 44 U/L - - -   Component Ref Range & Units 05:41  (02/18/21) 1 d ago  (02/17/21) 2 d ago  (02/16/21) 3 d ago  (02/15/21)  WBC 4.0 - 10.5 K/uL 27.6High  27.5High  17.2High  15.1High   RBC 3.87 - 5.11 MIL/uL 2.75Low  2.77Low  2.43Low  2.58Low   Hemoglobin 12.0 - 15.0 g/dL 10.6Low  10.8Low  9.6Low  10.3Low   HCT 36.0 - 46.0 % 30.7Low  31.0Low   26.3Low  28.9Low   MCV 80.0 - 100.0 fL 111.6High  111.9High  108.2High  112.0High   MCH 26.0 - 34.0 pg 38.5High  39.0High  39.5High  39.9High   MCHC 30.0 - 36.0 g/dL 34.5  34.8  36.5High  35.6   RDW 11.5 - 15.5 % 13.9  13.4  13.1  13.3   Platelets 150 - 400 K/uL 300  215  173  165   nRBC 0.0 - 0.2 % 0.0  0.0 CM  0.0 CM  0.0   Neutrophils Relative % % 77    86   Neutro Abs 1.7 - 7.7 K/uL 21.5High    13.0High   Lymphocytes Relative % 2    2   Lymphs Abs 0.7 - 4.0 K/uL 0.5Low    0.3Low   Monocytes Relative % 5    8   Monocytes Absolute 0.1 - 1.0 K/uL 1.4High    1.2High   Eosinophils Relative % 1    1   Eosinophils Absolute 0.0 - 0.5 K/uL 0.1    0.1   Basophils Relative % 0    0  Basophils Absolute 0.0 - 0.1 K/uL 0.0    0.1   WBC Morphology  MILD LEFT SHIFT (1-5% METAS, OCC MYELO, OCC BANDS)      Immature Granulocytes % 15    3   Abs Immature Granulocytes 0.00 - 0.07 K/uL 4.13High    0.46High   Burr Cells  PRESENT    PRESENT    Component Ref Range & Units 05:41 1 d ago  Magnesium 1.7 - 2.4 mg/dL 2.2  1.7 CM    Physical Exam:  General: Thin, older woman resting comfortably in bed in no acute distress.  HENT: Normocephalic, atraumatic. Moist mucus membranes. Hearing slightly impaired CV: Tachycardic with irregularly irregular rhythm. S1/S2 present. No murmurs, rubs, or gallops appreciated. Nonpitting edema to mid-shin BL, bilateral lower legs tender to palpation Pulm: Lungs CTAB. No wheezes or crackles. Breathing comfortably on room air.  Abd: Nontender, nondistended. No guarding or rebound. Normoactive bowel sounds Derm: Skin dry, wrinkled, intact. No rashes or lesions on visible skin Neuro: Alert and grossly oriented. Moves all extremities spontaneously. No grossly apparent focal neurologic deficits. Psych: Appropriate affect. Thought process logical, linear, and goal-directed   Assessment/Plan:  Principal Problem:   Sepsis ([place]) Active  Problems:   Urinary retention   Diverticular disease of intestine with perforation and abscess   Hyponatremia   Aortic atherosclerosis [facility])  Mrs. Radwan is a 87 year old lady with a history of Afib on Eliquis, glaucoma, and hypertension who was admitted for severe sepsis from perforated colonic diverticulitis with abscesses.   #Sepsis secondary to perforated sigmoid diverticulitis with abscesses Her blood pressures have been stable with no episodes of hypotension yesterday. WBC remains elevated at 27.6 with mild left shift. Blood cultures show no growth at 3 days. No abdominal tenderness on physical exam today, although rate has been trending up with tachycardia to 120 on exam today. At this point, Mrs. Hoar would prefer to continue with medical management. Surgery team consulted palliative care, who are facilitating goals of care conversations with family.  - Abscesses unchanged on 5/25 CT abdomen/pelvis, IR unable to drain  - Appreciate ongoing palliative care involvement  - Continue IV Zosyn - Continue to trend CBC - Continue clear liquid diet for now  #Hypotonic hypovolemic hyponatremia #Hypokalemia Na slowly trending up, most recent value 125. Her BLE edema is about the same today. Will continue to replete Na with mIVF and careful monitoring of electrolytes and volume status given findings of pulmonary hypertension on echo.  - mIVF NS at 146mL/hr  - IV Kchlor repletion with target K>4, mag>2 - Continue to monitor BMP q4 hours  - Strict I/Os to monitor volume status   #Afib HR trended up throughout the day yesterday and she was tachycardic to 120s on exam today, likely due to acute illness and poor po intake. She does not take meidcation for rate control at home. We will continue to monitor and may consider rhythm control if tachycardia persists.  - Continue cardiac monitoring - Continue to hold Eliquis   Code status: DNR Diet: clear liquid Fluids: NS 170mL/hr VTE ppx:  SCD   LOS: 3 days   Theodosia Blender, Medical Student 02/18/2021, 2:29 PM

## 2021-02-18 NOTE — Progress Notes (Signed)
Physical Therapy Treatment Patient Details Name: Daisy Williams MRN: [id number] DOB: [birth year] Today's Date: 02/18/2021    History of Present Illness Pt is a 87 y.o. female admitted 02/15/21 with abdominal pain, nausea, decreased appetite. CT angio abdomen/pelvis showed bowel changes and abscesses likely secondary to perforated sigmoid colon diverticulitis. Workup for sepsis, diverticulitis, possible UTI. IR did not find safe window for percutaneous drainage. Plan for medical management. Pt adamantly refusing any surgery. PMH includes glaucoma, inner ear dysfunction, afib on Eliquis.    PT Comments    Pt making good progress with mobility. Will try to finalize assistive device needed next session.    Follow Up Recommendations  Home health PT;Supervision for mobility/OOB     Equipment Recommendations  Rolling walker with 5" wheels;Other (comment) (vs rollator)    Recommendations for Other Services       Precautions / Restrictions Precautions Precautions: Fall Restrictions Weight Bearing Restrictions: No    Mobility  Bed Mobility Overal bed mobility: Needs Assistance Bed Mobility: Supine to Sit     Supine to sit: Min guard     General bed mobility comments: Pt sitting up in chair    Transfers Overall transfer level: Needs assistance Equipment used: 4-wheeled walker Transfers: Sit to/from Stand Sit to Stand: Min guard         General transfer comment: assist for safety  Ambulation/Gait Ambulation/Gait assistance: Min guard Gait Distance (Feet): 200 Feet Assistive device: 4-wheeled walker Gait Pattern/deviations: Step-through pattern;Decreased stride length;Trunk flexed Gait velocity: decr Gait velocity interpretation: 1.31 - 2.62 ft/sec, indicative of limited community ambulator General Gait Details: Assist for safety.   Stairs             Wheelchair Mobility    Modified Rankin (Stroke Patients Only)       Balance Overall balance assessment:  Needs assistance Sitting-balance support: No upper extremity supported;Feet supported Sitting balance-Leahy Scale: Fair     Standing balance support: No upper extremity supported;During functional activity Standing balance-Leahy Scale: Fair                              Cognition Arousal/Alertness: Awake/alert Behavior During Therapy: WFL for tasks assessed/performed Overall Cognitive Status: Within Functional Limits for tasks assessed                                        Exercises      General Comments General comments (skin integrity, edema, etc.): RHR - 102. HR with amb primarily 105-110 and then incr to 130 at the end of amb      Pertinent Vitals/Pain Pain Assessment: No/denies pain Pain Score: 0-No pain Pain Intervention(s): Monitored during session;Repositioned    Home Living                      Prior Function            PT Goals (current goals can now be found in the care plan section) Acute Rehab PT Goals Patient Stated Goal: return home Progress towards PT goals: Progressing toward goals    Frequency    Min 3X/week      PT Plan Current plan remains appropriate    Co-evaluation              AM-PAC PT "6 Clicks" Mobility   Outcome Measure  Help needed turning from  your back to your side while in a flat bed without using bedrails?: None Help needed moving from lying on your back to sitting on the side of a flat bed without using bedrails?: A Little Help needed moving to and from a bed to a chair (including a wheelchair)?: A Little Help needed standing up from a chair using your arms (e.g., wheelchair or bedside chair)?: A Little Help needed to walk in hospital room?: A Little Help needed climbing 3-5 steps with a railing? : A Little 6 Click Score: 19    End of Session Equipment Utilized During Treatment: Gait belt Activity Tolerance: Patient tolerated treatment well Patient left: in chair;with call  bell/phone within reach;with chair alarm set   PT Visit Diagnosis: Other abnormalities of gait and mobility (R26.89);Muscle weakness (generalized) (M62.81)     Time: 1220-1240 PT Time Calculation (min) (ACUTE ONLY): 20 min  Charges:  $Gait Training: 8-22 mins                     [place] Pager [phone] Office [place] 02/18/2021, 3:57 PM

## 2021-02-19 DIAGNOSIS — Z515 Encounter for palliative care: Secondary | ICD-10-CM | POA: Diagnosis not present

## 2021-02-19 DIAGNOSIS — A419 Sepsis, unspecified organism: Principal | ICD-10-CM

## 2021-02-19 DIAGNOSIS — I4891 Unspecified atrial fibrillation: Secondary | ICD-10-CM

## 2021-02-19 DIAGNOSIS — K5780 Diverticulitis of intestine, part unspecified, with perforation and abscess without bleeding: Secondary | ICD-10-CM | POA: Diagnosis not present

## 2021-02-19 LAB — BASIC METABOLIC PANEL
Anion gap: 6 (ref 5–15)
Anion gap: 6 (ref 5–15)
Anion gap: 5 (ref 5–15)
BUN: 5 mg/dL — ABNORMAL LOW (ref 8–23)
BUN: <5 mg/dL — ABNORMAL LOW (ref 8–23)
BUN: <5 mg/dL — ABNORMAL LOW (ref 8–23)
CO2: 16 mmol/L — ABNORMAL LOW (ref 22–32)
CO2: 18 mmol/L — ABNORMAL LOW (ref 22–32)
CO2: 20 mmol/L — ABNORMAL LOW (ref 22–32)
Calcium: 7.5 mg/dL — ABNORMAL LOW (ref 8.9–10.3)
Calcium: 7.8 mg/dL — ABNORMAL LOW (ref 8.9–10.3)
Calcium: 7.7 mg/dL — ABNORMAL LOW (ref 8.9–10.3)
Chloride: 103 mmol/L (ref 98–111)
Chloride: 101 mmol/L (ref 98–111)
Chloride: 100 mmol/L (ref 98–111)
Creatinine, Ser: 0.62 mg/dL (ref 0.44–1.00)
Creatinine, Ser: 0.61 mg/dL (ref 0.44–1.00)
Creatinine, Ser: 0.61 mg/dL (ref 0.44–1.00)
GFR, Estimated: >60 mL/min (ref >60)
GFR, Estimated: >60 mL/min (ref >60)
GFR, Estimated: >60 mL/min (ref >60)
Glucose, Bld: 100 mg/dL — ABNORMAL HIGH (ref 70–99)
Glucose, Bld: 99 mg/dL (ref 70–99)
Glucose, Bld: 121 mg/dL — ABNORMAL HIGH (ref 70–99)
Potassium: 3.6 mmol/L (ref 3.5–5.1)
Potassium: 3.6 mmol/L (ref 3.5–5.1)
Potassium: 3.4 mmol/L — ABNORMAL LOW (ref 3.5–5.1)
Sodium: 125 mmol/L — ABNORMAL LOW (ref 135–145)
Sodium: 125 mmol/L — ABNORMAL LOW (ref 135–145)
Sodium: 125 mmol/L — ABNORMAL LOW (ref 135–145)

## 2021-02-19 LAB — CBC WITH DIFFERENTIAL/PLATELET
Abs Immature Granulocytes: 2.3 10*3/uL — ABNORMAL HIGH (ref 0.00–0.07)
Band Neutrophils: 1 %
Basophils Absolute: 0 10*3/uL (ref 0.0–0.1)
Basophils Relative: 0 %
Eosinophils Absolute: 0.9 10*3/uL — ABNORMAL HIGH (ref 0.0–0.5)
Eosinophils Relative: 3 %
HCT: 30.1 % — ABNORMAL LOW (ref 36.0–46.0)
Hemoglobin: 10.8 g/dL — ABNORMAL LOW (ref 12.0–15.0)
Lymphocytes Relative: 3 %
Lymphs Abs: 0.9 10*3/uL (ref 0.7–4.0)
MCH: 39.3 pg — ABNORMAL HIGH (ref 26.0–34.0)
MCHC: 35.9 g/dL (ref 30.0–36.0)
MCV: 109.5 fL — ABNORMAL HIGH (ref 80.0–100.0)
Metamyelocytes Relative: 4 %
Monocytes Absolute: 1.1 10*3/uL — ABNORMAL HIGH (ref 0.1–1.0)
Monocytes Relative: 4 %
Myelocytes: 4 %
Neutro Abs: 23.4 10*3/uL — ABNORMAL HIGH (ref 1.7–7.7)
Neutrophils Relative %: 81 %
Platelets: 321 10*3/uL (ref 150–400)
RBC: 2.75 MIL/uL — ABNORMAL LOW (ref 3.87–5.11)
RDW: 13.7 % (ref 11.5–15.5)
WBC: 28.5 10*3/uL — ABNORMAL HIGH (ref 4.0–10.5)
nRBC: 0 /100 WBC
nRBC: 0.1 % (ref 0.0–0.2)

## 2021-02-19 LAB — PATHOLOGIST SMEAR REVIEW

## 2021-02-19 MED ORDER — ADULT MULTIVITAMIN W/MINERALS CH
1.0000 | ORAL_TABLET | Freq: Every day | ORAL | Status: DC
  Administered 2021-02-19 – 2021-02-25 (×7): 1 via ORAL
  Filled 2021-02-19 (×7): qty 1

## 2021-02-19 MED ORDER — ENSURE ENLIVE PO LIQD
237.0000 mL | Freq: Three times a day (TID) | ORAL | Status: DC
  Administered 2021-02-19 – 2021-02-25 (×16): 237 mL via ORAL
  Filled 2021-02-19 (×2): qty 237

## 2021-02-19 NOTE — Plan of Care (Signed)

## 2021-02-19 NOTE — Progress Notes (Signed)
Subjective: No acute events overnight.   Daisy Williams was seen resting comfortably in bed this morning. She reports feeling "the best I have since I came here," today but does not yet feel well enough for discharge. Worked with PT yesterday and was able to walk with assistance. She was able to tolerate soft diet for breakfast without any nausea or emesis. She denies abdominal pain this morning and has no new concerns. Continues to decline surgery at this time. Granddaughter was at bedside and received updates on lab results and hospital course.   Objective:  Vital signs in last 24 hours: Vitals:   02/18/21 2351 02/19/21 0503 02/19/21 0509 02/19/21 0800  BP: (!) 129/54 (!) 131/42  (!) 147/63  Pulse:    (!) 119  Resp: 19 (!) 21  18  Temp: 98.4 F (36.9 C) 98.8 F (37.1 C)  98.5 F (36.9 C)  TempSrc: Oral Oral  Oral  SpO2: 95% 96%  96%  Weight:   67.2 kg   Height:       Weight change: 15.5 kg  Intake/Output Summary (Last 24 hours) at 02/19/2021 1128 Last data filed at 02/19/2021 1000 Gross per 24 hour  Intake 978.7 ml  Output 400 ml  Net 578.7 ml   Labs: CMP Latest Ref Rng & Units 02/19/2021 02/18/2021 02/18/2021  Glucose 70 - 99 mg/dL 100(H) 98 112(H)  BUN 8 - 23 mg/dL 5(L) 5(L) 5(L)  Creatinine 0.44 - 1.00 mg/dL 0.62 0.64 0.62  Sodium 135 - 145 mmol/L 125(L) 125(L) 125(L)  Potassium 3.5 - 5.1 mmol/L 3.6 4.0 3.6  Chloride 98 - 111 mmol/L 103 101 102  CO2 22 - 32 mmol/L 16(L) 16(L) 15(L)  Calcium 8.9 - 10.3 mg/dL 7.5(L) 7.9(L) 7.5(L)  Total Protein 6.5 - 8.1 g/dL - - -  Total Bilirubin 0.3 - 1.2 mg/dL - - -  Alkaline Phos 38 - 126 U/L - - -  AST 15 - 41 U/L - - -  ALT 0 - 44 U/L - - -   CBC Latest Ref Rng & Units 02/19/2021 02/18/2021 02/17/2021  WBC 4.0 - 10.5 K/uL 28.5(H) 27.6(H) 27.5(H)  Hemoglobin 12.0 - 15.0 g/dL 10.8(L) 10.6(L) 10.8(L)  Hematocrit 36.0 - 46.0 % 30.1(L) 30.7(L) 31.0(L)  Platelets 150 - 400 K/uL 321 300 215      Component Value Date/Time   WBC  28.5 (H) 02/19/2021 0551   RBC 2.75 (L) 02/19/2021 0551   HGB 10.8 (L) 02/19/2021 0551   HCT 30.1 (L) 02/19/2021 0551   PLT 321 02/19/2021 0551   MCV 109.5 (H) 02/19/2021 0551   MCH 39.3 (H) 02/19/2021 0551   MCHC 35.9 02/19/2021 0551   RDW 13.7 02/19/2021 0551   LYMPHSABS 0.9 02/19/2021 0551   MONOABS 1.1 (H) 02/19/2021 0551   EOSABS 0.9 (H) 02/19/2021 0551   BASOSABS 0.0 02/19/2021 0551   Physical Exam: General: Older lady resting in bed in no acute distress HENT: Normocephalic, atraumatic. Moist mucus membranes. Hearing slightly impaired. Voice somewhat soft today CV: Borderline tachycardic rate, irregularly irregular rhythm. S1/S2 present with no murmurs/rubs/gallops appreciated. BL nonpitting edema to mid-shin unchanged from yesterday Pulm: Lungs CTAB. No increased work of breathing. Breathing comfortably on room air Abd: Soft, nontender, nondistended. No guarding or rebound. Bowel sounds present Derm: Dry, warm, wrinkly, and intact. No rashes or lesions on visible skin.  Neuro: Alert and grossly oriented. Speech and cognition intact. No grossly apparent focal neurologic deficits.  Psych: Appropriate affect. Thought process linear, logical, and  goal-oriented   Assessment/Plan:  Principal Problem:   Sepsis ([place]) Active Problems:   Urinary retention   Diverticular disease of intestine with perforation and abscess   Hyponatremia   Aortic atherosclerosis [facility])  Daisy Williams is a 87 year old lady with a history of hypertension, Afib on Eliquis, and glaucoma who was admitted with severe sepsis secondary to perforated sigmoid colon diverticulitis with multiple abscesses.   #Sepsis secondary to perforated colonic diverticulitis with abscesses Her blood pressure has been stable with no further episodes of hypotension. She continues to have wide pulse pressure with SBP occasionally in 140s-150s. Intermittent tachycardia to 120s persists, likely due to acute illness and poor po  intake. She is feeling better symptomatically with resolution of abdominal pain and tolerating soft po diet without nausea/emesis. Physical exam is stable without abdominal tenderness and unchanged BLE edema.  - Her white count slightly increased, 28.5 today from 27.6 - No growth of urine or blood cultures at 4 days - Continue IV Zosyn  - Appreciate ongoing surgery involvement. Per surgery consult, she now meets criteria for surgery but continues to decline surgical intervention, will continue medical management and continue to hold Eliquis. They recommend rescan in 48 hours to determine if drain placement is possible.  - Appreciate ongoing palliative care involvement  - Continue cardiac monitoring - Continue trending CBC - hydralazine 5mg  IM prn SBP>180 or DBP>110   #Hypotonic hypovolemic hyponatremia #Hypokalemia No significant change in Na, has been stable at 125. Her BLE is about the same today. We will continue to replete Na with IVF with careful monitoring of volume status. Anticipate additional improvement in Na with increased po intake, now on soft foods diet and tolerating well.  - mIVF NS at 145mL/hr - IV kchlor repletion with target K>4, mag>2 - Monitor BMP q8hrs - Strict I/Os to monitor volume status  #Afib HR not consistently elevated, intermittent tachycardia to 120s persists. She is not on rate-controlling medications at home. Given concern for borderline pressures and hypotension, would consider rhythm rather than rate control if tachycardia persists.  - Continue cardiac monitoring - Continue to hold Eliquis per surgery   Code status: DNR Diet: soft  Fluids: NS 117mL/hr VTE ppx: SCD   LOS: 4 days   Theodosia Blender, Medical Student 02/19/2021, 11:28 AM

## 2021-02-19 NOTE — Plan of Care (Signed)

## 2021-02-19 NOTE — Progress Notes (Signed)
Initial Nutrition Assessment  DOCUMENTATION CODES:   Not applicable  INTERVENTION:  -Ensure Enlive po TID, each supplement provides 350 kcal and 20 grams of protein -Magic cup TID with meals, each supplement provides 290 kcal and 9 grams of protein -MVI with minerals daily  NUTRITION DIAGNOSIS:   Inadequate oral intake related to poor appetite as evidenced by meal completion < 50%.  GOAL:   Patient will meet greater than or equal to 90% of their needs  MONITOR:   PO intake  REASON FOR ASSESSMENT:   Consult Assessment of nutrition requirement/status,Poor PO  ASSESSMENT:   Pt admitted with severe sepsis 2/2 perforated sigmoid diverticulitis with abscesses and possible UTI. PMH includes Afib, HTN, postural dizziness, and glaucoma.   5/23-5/25 NPO/CL diet 5/26 diet advanced to full liquid diet 5/27 diet advanced to soft diet  Pt reports intermittent abdominal pain, nausea, poor po intake, dysuria, and 3-4 episodes of small-volume diarrhea daily beginning on 5/21. Pt then had 1 episode of emesis after eating a graham cracker on 5/23 prior to presenting to the ED. Pt reports having increased belching over the weekend as well. Pt was performing all ADLs independently prior to that point but now reports increased generalized weakness 2/2 poor po intake.  Pt states that today she is feeling the best she has since admission, but not yet ready for discharge. Pt tolerating soft diet for breakfast today without N/V. RN reports pt ate 25% of breakfast and lunch today. Note PMT plans to meet with pt/family on Monday to further address [place].   Surgery following. Per latest PA note, pt does not want surgery; recommending rescanning pt in next 48 hours to see if a drain would be a possibility as opposed to just hospice. PA also noted that pt reported feeling better, but appears visibly weaker. Pt stated her abdomen feels "better than admission, but isn't perfect."  UOP: 676ml documented  today  Medications: . brimonidine  1 drop Both Eyes TID  . dorzolamide-timolol  1 drop Both Eyes BID  . feeding supplement  237 mL Oral TID BM  . latanoprost  1 drop Both Eyes QHS  . multivitamin with minerals  1 tablet Oral Daily  . sodium chloride flush  3 mL Intravenous Q12H   Labs:  Recent Labs  Lab 02/17/21 1514 02/17/21 2135 02/18/21 0541 02/18/21 1351 02/18/21 2212 02/19/21 0551 02/19/21 1332  NA 123*   < > 125*   < > 125* 125* 125*  K 4.1   < > 3.6   < > 4.0 3.6 3.6  CL 100   < > 101   < > 101 103 101  CO2 16*   < > 15*   < > 16* 16* 18*  BUN 6*   < > 5*   < > 5* 5* <5*  CREATININE 0.65   < > 0.61   < > 0.64 0.62 0.61  CALCIUM 7.4*   < > 7.7*   < > 7.9* 7.5* 7.8*  MG 1.7  --  2.2  --   --   --   --   GLUCOSE 101*   < > 95   < > 98 100* 99   < > = values in this interval not displayed.   Diet Order:   Diet Order            DIET SOFT Room service appropriate? Yes; Fluid consistency: Thin  Diet effective now  EDUCATION NEEDS:   No education needs have been identified at this time  Skin:  Skin Assessment: Reviewed RN Assessment  Last BM:  5/25 type 7  Height:   Ht Readings from Last 1 Encounters:  02/15/21 5\' 3"  (1.6 m)    Weight:   Wt Readings from Last 10 Encounters:  02/19/21 67.2 kg  12/08/20 52.5 kg  10/09/19 53.9 kg  10/09/18 55.8 kg  09/27/17 55.4 kg  12/13/16 56.9 kg  04/25/16 57.2 kg  10/22/15 57.7 kg  04/23/15 58.1 kg  03/09/15 54.4 kg    BMI:  Body mass index is 26.24 kg/m.  Estimated Nutritional Needs:   Kcal:  1600-1800  Protein:  80-90g  Fluid:  >1.6L    Larkin Ina, MS, RD, LDN RD pager number and weekend/on-call pager number located in [place].

## 2021-02-19 NOTE — Progress Notes (Signed)
Subjective/Chief Complaint: Patient tells me she is feeling better, but she looks weaker to me.  States her abdomen feels "better than admission, but isn't perfect."   Objective: Vital signs in last 24 hours: Temp:  [98.2 F (36.8 C)-98.8 F (37.1 C)] 98.8 F (37.1 C) (05/27 0503) Pulse Rate:  [71-110] 80 (05/26 1951) Resp:  [18-21] 21 (05/27 0503) BP: (117-174)/(42-81) 131/42 (05/27 0503) SpO2:  [95 %-97 %] 96 % (05/27 0503) Weight:  [67.2 kg] 67.2 kg (05/27 0509) Last BM Date: 02/17/21  Intake/Output from previous day: 05/26 0701 - 05/27 0700 In: 501.7 [I.V.:343.6; IV Piggyback:158.1] Out: 400 [Urine:400] Intake/Output this shift: No intake/output data recorded.  Heart: irregular, tachy in 120s Lungs: CTAB Abd: soft, NT, ND, +BS  Lab Results:  Recent Labs    02/18/21 0541 02/19/21 0551  WBC 27.6* 28.5*  HGB 10.6* 10.8*  HCT 30.7* 30.1*  PLT 300 321   BMET Recent Labs    02/18/21 2212 02/19/21 0551  NA 125* 125*  K 4.0 3.6  CL 101 103  CO2 16* 16*  GLUCOSE 98 100*  BUN 5* 5*  CREATININE 0.64 0.62  CALCIUM 7.9* 7.5*   PT/INR No results for input(s): LABPROT, INR in the last 72 hours. ABG No results for input(s): PHART, HCO3 in the last 72 hours.  Invalid input(s): PCO2, PO2  Studies/Results: CT ABDOMEN PELVIS W CONTRAST  Result Date: 02/17/2021 CLINICAL DATA:  Diverticulitis with complication suspected. EXAM: CT ABDOMEN AND PELVIS WITH CONTRAST TECHNIQUE: Multidetector CT imaging of the abdomen and pelvis was performed using the standard protocol following bolus administration of intravenous contrast. CONTRAST:  61mL OMNIPAQUE IOHEXOL 300 MG/ML  SOLN COMPARISON:  Two days ago FINDINGS: Lower chest: Moderate layering pleural effusions with lower lobe atelectasis. Hepatobiliary: Heterogeneous liver perfusion, suspect congestion given nutmeg appearance in this patient with dilated atria.No evidence of biliary obstruction or stone. Pancreas: Generalized  atrophy.  No acute finding. Spleen: Unremarkable. Adrenals/Urinary Tract: Negative adrenals. No hydronephrosis or stone. Unremarkable bladder. Stomach/Bowel: Sigmoid diverticulitis with perforation that is contiguous with a gas and fluid rim enhancing collection extending inferiorly and leftward and measuring up to 5.8 x 2.3 cm at its largest along the left pelvic sidewall. There is a superiorly extending component which measures approximately 3 cm. In total the collection appears continuous with a 9 cm craniocaudal span. No pneumoperitoneum. Vascular/Lymphatic: Extensive atheromatous calcification of the aorta, iliacs, and branch vessels. No mass or adenopathy. Reproductive:No acute finding. Other: No ascites or pneumoperitoneum. Musculoskeletal: No acute abnormalities. Generalized spinal degeneration IMPRESSION: 1. Unchanged diverticular abscess in the left lower quadrant and left hemipelvis, measuring approximately 9 cm in length by 3 cm in diameter. 2. Heterogeneous liver perfusion, likely cardiac and congestive. There are now moderate pleural effusions with bilateral lower lobe atelectasis. Electronically Signed   By: Monte Fantasia M.D.   On: 02/17/2021 11:32   ECHOCARDIOGRAM COMPLETE  Result Date: 02/17/2021    ECHOCARDIOGRAM REPORT   Patient Name:   Daisy Williams Date of Exam: 02/17/2021 Medical Rec #:  [id number]       Height:       63.0 in Accession #:    [id number]      Weight:       114.0 lb Date of Birth:  1927-12-01        BSA:          1.523 m Patient Age:    87 years        BP:  157/51 mmHg Patient Gender: F               HR:           79 bpm. Exam Location:  Inpatient Procedure: 2D Echo, Cardiac Doppler and Color Doppler Indications:    Aortic regurgitation  History:        Patient has prior history of Echocardiogram examinations, most                 recent 02/07/2015. Risk Factors:Hypertension.  Sonographer:    Cammy Brochure Referring Phys: [id number] NISCHAL NARENDRA IMPRESSIONS  1.  Left ventricular ejection fraction, by estimation, is 60 to 65%. The left ventricle has normal function. The left ventricle has no regional wall motion abnormalities. Left ventricular diastolic function could not be evaluated.  2. Right ventricular systolic function is normal. The right ventricular size is normal. There is moderately elevated pulmonary artery systolic pressure.  3. Left atrial size was severely dilated.  4. Right atrial size was severely dilated.  5. The mitral valve is normal in structure. Moderate mitral valve regurgitation. No evidence of mitral stenosis.  6. Tricuspid valve regurgitation is moderate to severe.  7. The aortic valve is tricuspid. There is mild calcification of the aortic valve. Aortic valve regurgitation is moderate. Mild aortic valve sclerosis is present, with no evidence of aortic valve stenosis.  8. The inferior vena cava is dilated in size with >50% respiratory variability, suggesting right atrial pressure of 8 mmHg. Comparison(s): Prior images unable to be directly viewed, comparison made by report only. Multiple valve problems have worsened since 2016 and there is a marked increase in the severity of pulmonary hypertension. FINDINGS  Left Ventricle: Left ventricular ejection fraction, by estimation, is 60 to 65%. The left ventricle has normal function. The left ventricle has no regional wall motion abnormalities. The left ventricular internal cavity size was normal in size. There is  no left ventricular hypertrophy. Left ventricular diastolic function could not be evaluated due to atrial fibrillation. Left ventricular diastolic function could not be evaluated. Right Ventricle: The right ventricular size is normal. No increase in right ventricular wall thickness. Right ventricular systolic function is normal. There is moderately elevated pulmonary artery systolic pressure. The tricuspid regurgitant velocity is 3.68 m/s, and with an assumed right atrial pressure of 8 mmHg, the  estimated right ventricular systolic pressure is 45.4 mmHg. Left Atrium: Left atrial size was severely dilated. Right Atrium: Right atrial size was severely dilated. Pericardium: There is no evidence of pericardial effusion. Mitral Valve: The mitral valve is normal in structure. Moderate mitral valve regurgitation, with centrally-directed jet. No evidence of mitral valve stenosis. Tricuspid Valve: The tricuspid valve is normal in structure. Tricuspid valve regurgitation is moderate to severe. No evidence of tricuspid stenosis. Aortic Valve: The aortic valve is tricuspid. There is mild calcification of the aortic valve. Aortic valve regurgitation is moderate. Mild aortic valve sclerosis is present, with no evidence of aortic valve stenosis. Aortic valve mean gradient measures 3.0 mmHg. Aortic valve peak gradient measures 5.4 mmHg. Aortic valve area, by VTI measures 2.74 cm. Pulmonic Valve: The pulmonic valve was not well visualized. Pulmonic valve regurgitation is not visualized. No evidence of pulmonic stenosis. Aorta: The aortic root is normal in size and structure. Venous: The inferior vena cava is dilated in size with greater than 50% respiratory variability, suggesting right atrial pressure of 8 mmHg. IAS/Shunts: No atrial level shunt detected by color flow Doppler.  LEFT VENTRICLE PLAX 2D LVIDd:  4.60 cm LVIDs:         2.80 cm LV PW:         1.00 cm LV IVS:        1.00 cm LVOT diam:     1.90 cm LV SV:         59 LV SV Index:   39 LVOT Area:     2.84 cm  RIGHT VENTRICLE             IVC RV Basal diam:  4.00 cm     IVC diam: 1.70 cm RV S prime:     10.10 cm/s LEFT ATRIUM             Index       RIGHT ATRIUM           Index LA diam:        3.80 cm 2.50 cm/m  RA Area:     22.80 cm LA Vol (A2C):   81.5 ml 53.52 ml/m RA Volume:   68.10 ml  44.72 ml/m LA Vol (A4C):   65.4 ml 42.95 ml/m LA Biplane Vol: 77.4 ml 50.83 ml/m  AORTIC VALVE AV Area (Vmax):    3.03 cm AV Area (Vmean):   2.76 cm AV Area (VTI):      2.74 cm AV Vmax:           116.33 cm/s AV Vmean:          81.467 cm/s AV VTI:            0.215 m AV Peak Grad:      5.4 mmHg AV Mean Grad:      3.0 mmHg LVOT Vmax:         124.33 cm/s LVOT Vmean:        79.167 cm/s LVOT VTI:          0.208 m LVOT/AV VTI ratio: 0.97  AORTA Ao Root diam: 3.00 cm Ao Asc diam:  2.50 cm MITRAL VALVE                TRICUSPID VALVE MV Area (PHT): 4.89 cm     TR Peak grad:   54.2 mmHg MV Decel Time: 155 msec     TR Vmax:        368.00 cm/s MV E velocity: 115.00 cm/s                             SHUNTS                             Systemic VTI:  0.21 m                             Systemic Diam: 1.90 cm Dani Gobble Croitoru MD Electronically signed by Sanda Klein MD Signature Date/Time: 02/17/2021/1:51:28 PM    Final     Anti-infectives: Anti-infectives (From admission, onward)   Start     Dose/Rate Route Frequency Ordered Stop   02/17/21 2315  piperacillin-tazobactam (ZOSYN) IVPB 3.375 g        3.375 g 12.5 mL/hr over 240 Minutes Intravenous Every 8 hours 02/17/21 2224     02/15/21 1500  piperacillin-tazobactam (ZOSYN) IVPB 3.375 g  Status:  Discontinued        3.375 g 12.5 mL/hr over 240 Minutes Intravenous Every 8 hours 02/15/21 1412 02/17/21  2224   02/15/21 1230  ceFEPIme (MAXIPIME) 2 g in sodium chloride 0.9 % 100 mL IVPB  Status:  Discontinued        2 g 200 mL/hr over 30 Minutes Intravenous  Once 02/15/21 1221 02/15/21 1223   02/15/21 1230  metroNIDAZOLE (FLAGYL) IVPB 500 mg        500 mg 100 mL/hr over 60 Minutes Intravenous  Once 02/15/21 1221 02/15/21 1409   02/15/21 1230  ceFEPIme (MAXIPIME) 2 g in sodium chloride 0.9 % 100 mL IVPB  Status:  Discontinued        2 g 200 mL/hr over 30 Minutes Intravenous Every 12 hours 02/15/21 1223 02/15/21 1412      Assessment/Plan: HTN A. Fib on eliquis- last dose yesterday morning Postural dizziness Glaucoma  Hyponatremia/hypokalemia - per medicine for replacement Possible UTI - UA with +leukocytes and many bacteria,  cx ordered  Hyperbilirubinemia - unclear etiology.  Other LFTs unremarkable.  Hepatitis panel is normal.  Perforated sigmoid diverticulitis with abscesses - abscess noted on scan unable to be drained by IR -cont zosyn for now -WBC remains elevated.  Patient doesn't look as good to me today as she did yesterday, although she states she still feels better since eating FLD.  She still denies abdominal pain -soft diet as she tolerates -very adamant she doesn't want surgery -cont conservative management at this point as nothing else to offer surgically or radiologically.  FEN: soft, IVF VTE: SCDs, can resume eliquis in the setting of no surgery; however, if patient chooses to pursue hospice care if she doesn't really improve, then may not be a need to restart this. ID: [email or web address] 5/23; Zosyn 5/23>>  Henreitta Cea 02/19/2021

## 2021-02-19 NOTE — Progress Notes (Signed)
Daily Progress Note   Patient Name: Daisy Williams       Date: 02/19/2021 DOB: [birth year]  Age: 87 y.o. MRN#: [id number] Attending Physician: Aldine Contes, MD Primary Care Physician: Leonard Downing, MD Admit Date: 02/15/2021  Reason for Consultation/Follow-up: To discuss complex medical decision making related to patient's goals of care  Patient is a little hard of hearing, but amazingly sharp for 87 years old.  She was at home and independent prior to admission.  Subjective: Met with patient and bedside and talked with her grandson, Audelia Acton on speaker phone at bedside.  Patient is content and not in pain.  She states she feels better and was happy to have soft diet today instead of clear liquids.    She was able to walk 200 feet yesterday with a RW and PT.  We talked thru her increasing WBC count.   There will be a follow up CT scan on Sunday to determine whether or not a drain can be placed to attempt to resolve the infection.     We talked about going home with Hospice services particularly if a drain can not be placed.   [name] nodded approvingly when we discussed providing more care in the home and not having the leave the house to go to doctors appointments or to the hospital.    Audelia Acton expressed concern that there may not be enough support in the house as his grandfather is able to provide meals but would be unable to actually help the patient mobilize.     We agreed to have a larger family meeting on Monday at 10:00.  Several family members will be here and by then we will have the results of the CT scan.   Assessment: Patient comfortable.  Larger family meeting planned for Monday morning at 10:00    Patient Profile/HPI:  87 y.o. female  with past medical history of  Afib on Eliquis, hypertension, postural dizziness, and glaucoma admitted on 02/15/2021 with several days of abdominal pain with nausea and anorexia. Patient diagnosed with sepsis secondary to perforated sigmoid diverticulitis with abscesses. Patient on IV antibiotics. IR unable to safely drain abscesses. WBC count increased but abd pain improved. Repeat CT abd 5/25 shows unchanged abscesses. Patient has stated she would like to avoid surgery. PMT consulted for further goals of  care conversations.    Length of Stay: 4   Vital Signs: BP (!) 155/66 (BP Location: Left Arm)   Pulse (!) 108   Temp 98.7 F (37.1 C) (Oral)   Resp (!) 22   Ht 5' 3"  (1.6 m)   Wt 67.2 kg   SpO2 95%   BMI 26.24 kg/m  SpO2: SpO2: 95 % O2 Device: O2 Device: Room Air O2 Flow Rate:         Palliative Assessment/Data:  50%     Palliative Care Plan    Recommendations/Plan:  Continue current care.  Ambulate as tolerated.  PMT meeting scheduled with multiple family members on Monday at 10:00  Code Status:  DNR  Prognosis:   Unable to determine   Discharge Planning:  To Be Determined.   Possibly home with [facility] and palliative vs Home with Hospice services.  Care plan was discussed with family.  Thank you for allowing the Palliative Medicine Team to assist in the care of this patient.  Total time spent:  35 min.     Greater than 50%  of this time was spent counseling and coordinating care related to the above assessment and plan.  Florentina Jenny, PA-C Palliative Medicine  Please contact Palliative MedicineTeam phone at [phone] for questions and concerns between 7 am - 7 pm.   Please see AMION for individual provider pager numbers.

## 2021-02-20 ENCOUNTER — Inpatient Hospital Stay (HOSPITAL_COMMUNITY): Payer: Medicare Other

## 2021-02-20 DIAGNOSIS — R062 Wheezing: Secondary | ICD-10-CM

## 2021-02-20 DIAGNOSIS — E871 Hypo-osmolality and hyponatremia: Secondary | ICD-10-CM

## 2021-02-20 LAB — BASIC METABOLIC PANEL
Anion gap: 5 (ref 5–15)
Anion gap: 4 — ABNORMAL LOW (ref 5–15)
Anion gap: 6 (ref 5–15)
BUN: <5 mg/dL — ABNORMAL LOW (ref 8–23)
BUN: <5 mg/dL — ABNORMAL LOW (ref 8–23)
BUN: <5 mg/dL — ABNORMAL LOW (ref 8–23)
CO2: 20 mmol/L — ABNORMAL LOW (ref 22–32)
CO2: 20 mmol/L — ABNORMAL LOW (ref 22–32)
CO2: 21 mmol/L — ABNORMAL LOW (ref 22–32)
Calcium: 7.9 mg/dL — ABNORMAL LOW (ref 8.9–10.3)
Calcium: 8 mg/dL — ABNORMAL LOW (ref 8.9–10.3)
Calcium: 7.9 mg/dL — ABNORMAL LOW (ref 8.9–10.3)
Chloride: 104 mmol/L (ref 98–111)
Chloride: 107 mmol/L (ref 98–111)
Chloride: 104 mmol/L (ref 98–111)
Creatinine, Ser: 0.57 mg/dL (ref 0.44–1.00)
Creatinine, Ser: 0.53 mg/dL (ref 0.44–1.00)
Creatinine, Ser: 0.57 mg/dL (ref 0.44–1.00)
GFR, Estimated: >60 mL/min (ref >60)
GFR, Estimated: >60 mL/min (ref >60)
GFR, Estimated: >60 mL/min (ref >60)
Glucose, Bld: 113 mg/dL — ABNORMAL HIGH (ref 70–99)
Glucose, Bld: 116 mg/dL — ABNORMAL HIGH (ref 70–99)
Glucose, Bld: 108 mg/dL — ABNORMAL HIGH (ref 70–99)
Potassium: 3.6 mmol/L (ref 3.5–5.1)
Potassium: 3.6 mmol/L (ref 3.5–5.1)
Potassium: 3.9 mmol/L (ref 3.5–5.1)
Sodium: 129 mmol/L — ABNORMAL LOW (ref 135–145)
Sodium: 131 mmol/L — ABNORMAL LOW (ref 135–145)
Sodium: 131 mmol/L — ABNORMAL LOW (ref 135–145)

## 2021-02-20 LAB — CULTURE, BLOOD (SINGLE)
Culture: NO GROWTH
Culture: NO GROWTH
Special Requests: ADEQUATE

## 2021-02-20 LAB — CBC WITH DIFFERENTIAL/PLATELET
Abs Immature Granulocytes: 6.85 10*3/uL — ABNORMAL HIGH (ref 0.00–0.07)
Basophils Absolute: 0.1 10*3/uL (ref 0.0–0.1)
Basophils Relative: 0 %
Eosinophils Absolute: 0.5 10*3/uL (ref 0.0–0.5)
Eosinophils Relative: 2 %
HCT: 29.9 % — ABNORMAL LOW (ref 36.0–46.0)
Hemoglobin: 10.7 g/dL — ABNORMAL LOW (ref 12.0–15.0)
Immature Granulocytes: 23 %
Lymphocytes Relative: 2 %
Lymphs Abs: 0.7 10*3/uL (ref 0.7–4.0)
MCH: 38.9 pg — ABNORMAL HIGH (ref 26.0–34.0)
MCHC: 35.8 g/dL (ref 30.0–36.0)
MCV: 108.7 fL — ABNORMAL HIGH (ref 80.0–100.0)
Monocytes Absolute: 1.8 10*3/uL — ABNORMAL HIGH (ref 0.1–1.0)
Monocytes Relative: 6 %
Neutro Abs: 20.3 10*3/uL — ABNORMAL HIGH (ref 1.7–7.7)
Neutrophils Relative %: 67 %
Platelets: 356 10*3/uL (ref 150–400)
RBC: 2.75 MIL/uL — ABNORMAL LOW (ref 3.87–5.11)
RDW: 14 % (ref 11.5–15.5)
WBC: 30.2 10*3/uL — ABNORMAL HIGH (ref 4.0–10.5)
nRBC: 0.1 % (ref 0.0–0.2)

## 2021-02-20 MED ORDER — SODIUM CHLORIDE 0.9 % IV SOLN: INTRAVENOUS | Status: DC

## 2021-02-20 MED ORDER — IPRATROPIUM-ALBUTEROL 0.5-2.5 (3) MG/3ML IN SOLN
3.0000 mL | Freq: Three times a day (TID) | RESPIRATORY_TRACT | Status: DC
  Administered 2021-02-20 (×2): 3 mL via RESPIRATORY_TRACT
  Filled 2021-02-20 (×2): qty 3

## 2021-02-20 MED ORDER — POTASSIUM CHLORIDE CRYS ER 20 MEQ PO TBCR
20.0000 meq | EXTENDED_RELEASE_TABLET | Freq: Two times a day (BID) | ORAL | Status: DC
  Administered 2021-02-20 (×2): 20 meq via ORAL
  Filled 2021-02-20 (×2): qty 1

## 2021-02-20 NOTE — Plan of Care (Signed)

## 2021-02-20 NOTE — Progress Notes (Signed)
Subjective/Chief Complaint: Patient continues to tell me she is feeling better and has no abdominal pain.  She doesn't have a great appetite, but is eating some solid food with no worsening in her pain.   Objective: Vital signs in last 24 hours: Temp:  [98 F (36.7 C)-99.1 F (37.3 C)] 98 F (36.7 C) (05/28 0815) Pulse Rate:  [96-108] 99 (05/28 0815) Resp:  [17-22] 17 (05/28 0815) BP: (133-161)/(45-76) 161/76 (05/28 0815) SpO2:  [94 %-95 %] 94 % (05/28 0815) Weight:  [66.3 kg] 66.3 kg (05/28 0429) Last BM Date: 02/17/21  Intake/Output from previous day: 05/27 0701 - 05/28 0700 In: 720 [P.O.:720] Out: 650 [Urine:650] Intake/Output this shift: No intake/output data recorded.  Heart: irregular Lungs: audible wheeze with talking Abd: soft, NT, ND, +BS  Lab Results:  Recent Labs    02/19/21 0551 02/20/21 0420  WBC 28.5* 30.2*  HGB 10.8* 10.7*  HCT 30.1* 29.9*  PLT 321 356   BMET Recent Labs    02/19/21 2102 02/20/21 0420  NA 125* 129*  K 3.4* 3.6  CL 100 104  CO2 20* 20*  GLUCOSE 121* 113*  BUN <5* <5*  CREATININE 0.61 0.57  CALCIUM 7.7* 7.9*   PT/INR No results for input(s): LABPROT, INR in the last 72 hours. ABG No results for input(s): PHART, HCO3 in the last 72 hours.  Invalid input(s): PCO2, PO2  Studies/Results: No results found.  Anti-infectives: Anti-infectives (From admission, onward)   Start     Dose/Rate Route Frequency Ordered Stop   02/17/21 2315  piperacillin-tazobactam (ZOSYN) IVPB 3.375 g        3.375 g 12.5 mL/hr over 240 Minutes Intravenous Every 8 hours 02/17/21 2224     02/15/21 1500  piperacillin-tazobactam (ZOSYN) IVPB 3.375 g  Status:  Discontinued        3.375 g 12.5 mL/hr over 240 Minutes Intravenous Every 8 hours 02/15/21 1412 02/17/21 2224   02/15/21 1230  ceFEPIme (MAXIPIME) 2 g in sodium chloride 0.9 % 100 mL IVPB  Status:  Discontinued        2 g 200 mL/hr over 30 Minutes Intravenous  Once 02/15/21 1221 02/15/21  1223   02/15/21 1230  metroNIDAZOLE (FLAGYL) IVPB 500 mg        500 mg 100 mL/hr over 60 Minutes Intravenous  Once 02/15/21 1221 02/15/21 1409   02/15/21 1230  ceFEPIme (MAXIPIME) 2 g in sodium chloride 0.9 % 100 mL IVPB  Status:  Discontinued        2 g 200 mL/hr over 30 Minutes Intravenous Every 12 hours 02/15/21 1223 02/15/21 1412      Assessment/Plan: HTN A. Fib on eliquis- on hold Postural dizziness Glaucoma  Hyponatremia/hypokalemia - per medicine for replacement Possible UTI - UA with +leukocytes and many bacteria, cx ordered  Hyperbilirubinemia - unclear etiology.  Other LFTs unremarkable.  Hepatitis panel is normal.  Perforated sigmoid diverticulitis with abscesses - abscess noted on scan unable to be drained by IR -cont zosyn for now -WBC remains elevated.  We will plan to repeat a CT scan tomorrow prior to family meeting on Monday to again reassess there is nothing drainable.  If this remains the case, abx are our only option of treatment.  The patient and I discussed this today and my concern that already IV abx are not seeming to help her WBC and what this may mean for her moving forward.  She understands and thanks everyone for their good care.  She again restates  no surgery, but treat what we can treat at this time. -soft diet as she tolerates   FEN: soft, IVF VTE: SCDs, hold eliquis ID: [email or web address] 5/23; Zosyn 5/23>>  Daisy Williams 02/20/2021

## 2021-02-20 NOTE — Progress Notes (Signed)
Occupational Therapy Treatment Patient Details Name: Daisy Williams MRN: [id number] DOB: Feb 20, 1928 Today's Date: 02/20/2021    History of present illness Pt is a 87 y.o. female admitted 02/15/21 with abdominal pain, nausea, decreased appetite. CT angio abdomen/pelvis showed bowel changes and abscesses likely secondary to perforated sigmoid colon diverticulitis. Workup for sepsis, diverticulitis, possible UTI. IR did not find safe window for percutaneous drainage. Plan for medical management. Pt adamantly refusing any surgery. PMH includes glaucoma, inner ear dysfunction, afib on Eliquis.   OT comments  Pt progressing towards OT goals, pleasant and motivated for OOB activities. Pt able to demo mobility in room using RW at min guard, no overt LOB. Pt able to complete various grooming tasks standing at sink >8 min with no more than Setup assist. HR briefly up to 152bpm but sustained 110s-130s during activities. Continue to recommend [place] follow-up at DC.    Follow Up Recommendations  Home health OT;Supervision - Intermittent    Equipment Recommendations  Tub/shower seat    Recommendations for Other Services      Precautions / Restrictions Precautions Precautions: Fall;Other (comment) Precaution Comments: watch HR Restrictions Weight Bearing Restrictions: No       Mobility Bed Mobility Overal bed mobility: Needs Assistance Bed Mobility: Supine to Sit     Supine to sit: Min assist;HOB elevated     General bed mobility comments: Min A to advance trunk to EOB, increased time to scoot forward    Transfers Overall transfer level: Needs assistance Equipment used: Rolling walker (2 wheeled) Transfers: Sit to/from Stand Sit to Stand: Min guard         General transfer comment: increased time to rise    Balance Overall balance assessment: Needs assistance Sitting-balance support: No upper extremity supported;Feet supported Sitting balance-Leahy Scale: Fair     Standing  balance support: No upper extremity supported;During functional activity Standing balance-Leahy Scale: Fair Standing balance comment: fair static standing at sink                           ADL either performed or assessed with clinical judgement   ADL Overall ADL's : Needs assistance/impaired     Grooming: Set up;Standing;Wash/dry face;Oral care;Brushing hair Grooming Details (indicate cue type and reason): setup standing at sink > 8 min, no LOB or fatigue reported                             Functional mobility during ADLs: Min guard;Rolling walker;Cueing for safety;Cueing for sequencing General ADL Comments: able to progress standing tolerance and mobility with ADLs, HR up to 152bpm once but sustained <134bpm with activities     Vision   Vision Assessment?: No apparent visual deficits   Perception     Praxis      Cognition Arousal/Alertness: Awake/alert Behavior During Therapy: WFL for tasks assessed/performed Overall Cognitive Status: Within Functional Limits for tasks assessed                                 General Comments: endorses some short term memory deficits but [facility]        Exercises     Shoulder Instructions       General Comments Resting heart rate 99-125bpm, 110s-130s with activity, one instance up to 152bpm but brief. Pt denies any lightheadedness. MDs entering at initiation of OT session, Xray tech  noted to await outside door during OT session shortly after MDs left    Pertinent Vitals/ Pain       Pain Assessment: No/denies pain  Home Living                                          Prior Functioning/Environment              Frequency  Min 2X/week        Progress Toward Goals  OT Goals(current goals can now be found in the care plan section)  Progress towards OT goals: Progressing toward goals  Acute Rehab OT Goals Patient Stated Goal: return home OT Goal Formulation: With  patient Time For Goal Achievement: 03/02/21 Potential to Achieve Goals: Good ADL Goals Pt Will Perform Grooming: with supervision;with set-up;standing Pt Will Perform Upper Body Bathing: with set-up;sitting Pt Will Perform Lower Body Bathing: with min guard assist;with supervision Pt Will Perform Upper Body Dressing: with set-up;sitting Pt Will Perform Lower Body Dressing: with min guard assist;with supervision Pt Will Transfer to Toilet: with supervision;ambulating Pt Will Perform Toileting - Clothing Manipulation and hygiene: with min guard assist  Plan Discharge plan remains appropriate    Co-evaluation                 AM-PAC OT "6 Clicks" Daily Activity     Outcome Measure   Help from another person eating meals?: None Help from another person taking care of personal grooming?: A Little Help from another person toileting, which includes using toliet, bedpan, or urinal?: A Little Help from another person bathing (including washing, rinsing, drying)?: A Little Help from another person to put on and taking off regular upper body clothing?: None Help from another person to put on and taking off regular lower body clothing?: A Little 6 Click Score: 20    End of Session Equipment Utilized During Treatment: Gait belt;Rolling walker  OT Visit Diagnosis: Unsteadiness on feet (R26.81);Other abnormalities of gait and mobility (R26.89);Muscle weakness (generalized) (M62.81)   Activity Tolerance Patient tolerated treatment well   Patient Left in chair;with call bell/phone within reach;with chair alarm set;Other (comment) Development worker, international aid tech entering)   Nurse Communication Mobility status;Other (comment) (NT - purewick unable to be set back up or feet elevated)        Time: 5364-6803 OT Time Calculation (min): 29 min  Charges: OT General Charges $OT Visit: 1 Visit OT Treatments $Self Care/Home Management : 8-22 mins $Therapeutic Activity: 8-22 mins  Malachy Chamber, OTR/L Acute Rehab  Services Office: [phone]   Daisy Williams 02/20/2021, 11:15 AM

## 2021-02-20 NOTE — Progress Notes (Addendum)
Subjective: No acute events overnight.   Daisy Williams is seen resting in bed this morning. She says that she is feeling fine. Tolerating her diet without any nausea, emesis, or abdominal pain. She continues to decline surgery at this time, family meeting with palliative care is scheduled for Monday.   She reports new, intermittent cough yesterday and is now wheezing, although denies difficulty breathing, shortness of breath, and chest pain. She denies a history of asthma. She is agreeable to nebulizer treatment.   Objective:  Vital signs in last 24 hours: Vitals:   02/19/21 1942 02/20/21 0034 02/20/21 0429 02/20/21 0815  BP: (!) 145/55 (!) 133/45 (!) 151/47 (!) 161/76  Pulse:   98 99  Resp: 19  20 17   Temp: 98.6 F (37 C) 98.7 F (37.1 C) 98 F (36.7 C) 98 F (36.7 C)  TempSrc: Oral Oral Oral Oral  SpO2:    94%  Weight:   66.3 kg   Height:       Weight change: -0.9 kg  Intake/Output Summary (Last 24 hours) at 02/20/2021 1055 Last data filed at 02/19/2021 1600 Gross per 24 hour  Intake 240 ml  Output 650 ml  Net -410 ml   Labs:     Component Value Date/Time   WBC 30.2 (H) 02/20/2021 0420   RBC 2.75 (L) 02/20/2021 0420   HGB 10.7 (L) 02/20/2021 0420   HCT 29.9 (L) 02/20/2021 0420   PLT 356 02/20/2021 0420   MCV 108.7 (H) 02/20/2021 0420   MCH 38.9 (H) 02/20/2021 0420   MCHC 35.8 02/20/2021 0420   RDW 14.0 02/20/2021 0420   LYMPHSABS 0.7 02/20/2021 0420   MONOABS 1.8 (H) 02/20/2021 0420   EOSABS 0.5 02/20/2021 0420   BASOSABS 0.1 02/20/2021 0420   BMP Latest Ref Rng & Units 02/20/2021 02/19/2021 02/19/2021  Glucose 70 - 99 mg/dL 113(H) 121(H) 99  BUN 8 - 23 mg/dL <5(L) <5(L) <5(L)  Creatinine 0.44 - 1.00 mg/dL 0.57 0.61 0.61  Sodium 135 - 145 mmol/L 129(L) 125(L) 125(L)  Potassium 3.5 - 5.1 mmol/L 3.6 3.4(L) 3.6  Chloride 98 - 111 mmol/L 104 100 101  CO2 22 - 32 mmol/L 20(L) 20(L) 18(L)  Calcium 8.9 - 10.3 mg/dL 7.9(L) 7.7(L) 7.8(L)   Imaging:  Chest XR  02/20/2021:  1. Enlarged cardiac silhouette 2. Lower lobe predominant interstitial thickening and bilateral pleural effusions. This likely represents interstitial pulmonary edema.  Physical Exam: General: thin older lady resting in bed in no acute distress HENT: Normocephalic, atraumatic. Hearing mildly impaired. Mucus membranes moist CV: Regular rate, irregularly irregular rhythm. S1/S2 present with no murmurs, rubs, or gallops appreciated. BLE nonpitting edema unchanged Pulm: Tachypneic with grossly audible inspiratory and expiratory wheezing. BL wheeze louder anteriorly, no crackles or wheeze in BL lower lobes.  Abd: Soft, nondistended, nontender. Normoactive bowel sounds Neuro: Alert and grossly oriented. Speech and cognition grossly intact. Moves all extremities spontaneously.  Psych: Appropriate affect. Thought process is logical, linear, and goal-oriented.   Assessment/Plan:  Principal Problem:   Sepsis ([place]) Active Problems:   Urinary retention   Diverticular disease of intestine with perforation and abscess   Hyponatremia   Aortic atherosclerosis [facility])  Daisy Williams is a 87 year old lady with history of Afib on Eliquis, hypertension, and glaucoma who was admitted for severe sepsis due to perforated colonic diverticulitis with multiple abscesses and hyponatremia.   #Sepsis due to perforated colonic diverticulitis with multiple abscesses She is feeling better symptomatically with no abdominal tenderness on exam and  tolerating diet but white count continues to trend up despite IV antibiotic therapy. Per surgery, she is now a surgical candidate but continues to decline surgery. Palliative care is involved with family meeting scheduled for 5/30. No further episodes of hypotension, continues to be intermittently tachycardic to 110s.  - Appreciate ongoing surgery involvement. They recommend repeat CT abdomen tomorrow to reassess possibility of drain placement. IR previously could not  find an appropriate window - Appreciate palliative care involvement. Family meeting scheduled for 5/30 for review of updated CT scan and goals of care discussion  - WBC 30.2 today from 28.5 yesterday - Continue IV Zosyn for now  - No blood culture growth at 4 days  - Continue to trend CBC - Hydralazine 5mg  IM prn SBP>180 or DBP>110  #Wheezing #History of emphysema  New inspiratory wheezing today that is grossly audible and louder in anterior and upper lobes on exam with no crackles appreciated. She is tachypneic with RR 19-25 this morning, continues to saturate 93+ on room air with no history of oxygen requirement. She denies any difficulty breathing but notes a new, intermittent cough that began yesterday. No history of asthma with brief, remote history of social cigarette smoking. Per chart review, emphysema present on 2016 CXR.  - CXR today reveals BL pleural effusions, likely interstitial pulmonary edema - Will hold mIVF for now to avoid volume overload  - Duonebs TID for wheezing - Continue to monitor respiratory status  #Hypotonic hypervolemic hyponatremia #Hypokalemia Na increased to 129 from 125 yesterday with no change in BLE edema. When reviewing chart found discrepancy between ordered IVF NS at 14mL/hr and charted IV input, called nurse to confirm and found that NS was at 66mL/hr for unknown duration. Given new wheezing and pleural effusions on CXR, will hold off on restarting fluids.  - Holding mIVF for now, Na now 129 from 118 at presentation. Anticipate continued improvement with improved po intake - Monitor BMP q8hrs - Repeat K repletion to target of K>4 - Careful monitoring of I/O and volume status  #Afib Irregularly irregular rhythm on exam with intermittent tachycardia to 110s. Suspect this is related to acute illness, will continue to monitor. If necessary, may treat with rhythm rather than rate control given previously borderline/low pressures. - Continue to hold  Eliquis per surgery  - Continue cardiac monitoring - Will keep a close eye on heart rate s/p Duonebs  Code Status: DNR Fluids: NS Diet: soft VTE ppx: SCD   LOS: 5 days   Theodosia Blender, Medical Student 02/20/2021, 10:55 AM

## 2021-02-21 ENCOUNTER — Inpatient Hospital Stay (HOSPITAL_COMMUNITY): Payer: Medicare Other

## 2021-02-21 LAB — BASIC METABOLIC PANEL
Anion gap: 5 (ref 5–15)
BUN: <5 mg/dL — ABNORMAL LOW (ref 8–23)
CO2: 23 mmol/L (ref 22–32)
Calcium: 7.9 mg/dL — ABNORMAL LOW (ref 8.9–10.3)
Chloride: 102 mmol/L (ref 98–111)
Creatinine, Ser: 0.49 mg/dL (ref 0.44–1.00)
GFR, Estimated: >60 mL/min (ref >60)
Glucose, Bld: 127 mg/dL — ABNORMAL HIGH (ref 70–99)
Potassium: 4.1 mmol/L (ref 3.5–5.1)
Sodium: 130 mmol/L — ABNORMAL LOW (ref 135–145)

## 2021-02-21 LAB — CBC
HCT: 29.7 % — ABNORMAL LOW (ref 36.0–46.0)
Hemoglobin: 10.4 g/dL — ABNORMAL LOW (ref 12.0–15.0)
MCH: 39.4 pg — ABNORMAL HIGH (ref 26.0–34.0)
MCHC: 35 g/dL (ref 30.0–36.0)
MCV: 112.5 fL — ABNORMAL HIGH (ref 80.0–100.0)
Platelets: 360 10*3/uL (ref 150–400)
RBC: 2.64 MIL/uL — ABNORMAL LOW (ref 3.87–5.11)
RDW: 14.6 % (ref 11.5–15.5)
WBC: 27.7 10*3/uL — ABNORMAL HIGH (ref 4.0–10.5)
nRBC: 0.1 % (ref 0.0–0.2)

## 2021-02-21 MED ORDER — IPRATROPIUM-ALBUTEROL 0.5-2.5 (3) MG/3ML IN SOLN: 3.0000 mL | Freq: Three times a day (TID) | RESPIRATORY_TRACT | Status: DC | PRN

## 2021-02-21 MED ORDER — IOHEXOL 9 MG/ML PO SOLN
500.0000 mL | ORAL | Status: AC
  Administered 2021-02-21 (×2): 500 mL via ORAL

## 2021-02-21 MED ORDER — METOPROLOL TARTRATE 12.5 MG HALF TABLET
12.5000 mg | ORAL_TABLET | Freq: Two times a day (BID) | ORAL | Status: DC
  Administered 2021-02-21 – 2021-02-25 (×9): 12.5 mg via ORAL
  Filled 2021-02-21 (×9): qty 1

## 2021-02-21 MED ORDER — IPRATROPIUM-ALBUTEROL 0.5-2.5 (3) MG/3ML IN SOLN: 3.0000 mL | Freq: Four times a day (QID) | RESPIRATORY_TRACT | Status: DC | PRN

## 2021-02-21 NOTE — Consult Note (Signed)
Chief Complaint: Colonic diverticular abscess. Request is for abscess drain placement  Referring Physician(s): Wilmer Floor PA  Supervising Physician: Ruthann Cancer  Patient Status: [facility] - In-pt  History of Present Illness: Daisy Williams is a 87 y.o. female History of a fib (on eliquis) HTN, Presented to the ED at [facility]  On 5.23.22 with abdominal pain nausea and vomiting. X several days with accompanied fever. CT Abd pelvis from 5.29.22 reads No change to slight increase in size of known presumed diverticular abscess involving the left hemipelvis and presumably involving the left adnexal structures. The amount of air within the collection has increased while the amount of fluid has reduced. If patient is symptoms are not improving with continued conservative management and there is a desire to attempt percutaneous aspiration and/or drainage catheter placement, would recommend further evaluation with IV contrast-enhanced CT scan of the abdomen and pelvis for procedural planning purposes (which may be performed immediately prior to attempted drainage catheter placement). Team is requesting abcess drain placement to the colonic diverticular abscess.  Currently without any significant complaints. Patient alert and laying in bed husband at bedside, calm and comfortable. Endorses abdominal pain. Denies any fevers, headache, chest pain, SOB, cough, nausea, vomiting or bleeding. Patient request consent be obtained from her daughter Meredith Mody. Updated Gwen and her grandson Audelia Acton on plan of care. Telephone consent obtained from [facility]. Patient and Family made aware that patient will be given IV contrast and scanned prior to the start of the procedure for determination of safe window. Return precautions and treatment recommendations and follow-up discussed with the patient (husband, daughter and grandson) who is agreeable with the plan.      Past Medical History:  Diagnosis Date  . Glaucoma   .  Hematuria    Patient reports PCP has followed, improved since using cranberry juice  . Inner ear dysfunction   . Melanoma ([place])    Skin cancer removal    History reviewed. No pertinent surgical history.  Allergies: Patient has no known allergies.  Medications: Prior to Admission medications   Medication Sig Start Date End Date Taking? Authorizing Provider  apixaban (ELIQUIS) 2.5 MG TABS tablet Take 1 tablet (2.5 mg total) by mouth 2 (two) times daily. 09/09/20  Yes Hilty, Nadean Corwin, MD  brimonidine (ALPHAGAN) 0.2 % ophthalmic solution Place 1 drop into both eyes in the morning and at bedtime.   Yes [provider]  CALCIUM-VITAMIN D PO Take 2 tablets by mouth daily.   Yes [provider]  Cyanocobalamin (VITAMIN B 12 PO) Take 1 tablet by mouth daily.   Yes [provider]  dorzolamide-timolol (COSOPT) 22.3-6.8 MG/ML ophthalmic solution Place 1 drop into both eyes 2 (two) times daily.  08/28/17  Yes [provider]  latanoprost (XALATAN) 0.005 % ophthalmic solution Place 1 drop into both eyes at bedtime. 01/01/15  Yes [provider]  metoprolol tartrate (LOPRESSOR) 25 MG tablet TAKE 1/2 TABLET BY MOUTH 2 TIMES A DAY. PLEASE KEEP UPCOMING APPOINTMENT FOR FUTURE REFILLS. THANK YOU. Patient taking differently: Take 12.5 mg by mouth 2 (two) times daily. 12/08/20  Yes Troy Sine, MD     Family History  Problem Relation Age of Onset  . Colon cancer Mother   . Heart disease Father        Father passed away at 66. Was taking heart medicine - "didn't last a month after they put him on it."  . Heart disease Brother  Brother was on heart medicine. He went off his heart medicine and died shortly after - age 77. (heart attack?)  . Other Sister        Sister was a nursing home patient and passed away at 70 of unknown cause.    Social History   Socioeconomic History  . Marital status: Married    Spouse name: Not on file  . Number of  children: 2  . Years of education: 6th   . Highest education level: Not on file  Occupational History  . Occupation: Publishing copy  Tobacco Use  . Smoking status: Never Smoker  . Smokeless tobacco: Never Used  Substance and Sexual Activity  . Alcohol use: No    Alcohol/week: 0.0 standard drinks  . Drug use: No  . Sexual activity: Not on file  Other Topics Concern  . Not on file  Social History Narrative  . Not on file   Social Determinants of Health   Financial Resource Strain: Not on file  Food Insecurity: Not on file  Transportation Needs: Not on file  Physical Activity: Not on file  Stress: Not on file  Social Connections: Not on file      Review of Systems: A 12 point ROS discussed and pertinent positives are indicated in the HPI above.  All other systems are negative.  Review of Systems  Constitutional: Negative for fatigue and fever.  HENT: Negative for congestion.   Respiratory: Negative for cough and shortness of breath.   Gastrointestinal: Positive for abdominal pain. Negative for diarrhea, nausea and vomiting.    Vital Signs: BP (!) 142/59 (BP Location: Left Arm)   Pulse 96   Temp 98.3 F (36.8 C) (Oral)   Resp 20   Ht 5\' 3"  (1.6 m)   Wt 149 lb 7.6 oz (67.8 kg)   SpO2 94%   BMI 26.48 kg/m   Physical Exam Vitals and nursing note reviewed.  Constitutional:      Appearance: She is well-developed.  HENT:     Head: Normocephalic and atraumatic.  Eyes:     Conjunctiva/sclera: Conjunctivae normal.  Cardiovascular:     Pulses: Normal pulses.     Heart sounds: Normal heart sounds.  Pulmonary:     Effort: Pulmonary effort is normal.     Breath sounds: Normal breath sounds.  Musculoskeletal:        General: Normal range of motion.     Cervical back: Normal range of motion.  Skin:    General: Skin is warm.  Neurological:     Mental Status: She is alert and oriented to person, place, and time.     Imaging: CT ABDOMEN PELVIS  WO CONTRAST  Result Date: 02/21/2021 CLINICAL DATA:  Concern for diverticulitis. EXAM: CT ABDOMEN AND PELVIS WITHOUT CONTRAST TECHNIQUE: Multidetector CT imaging of the abdomen and pelvis was performed following the standard protocol without IV contrast. COMPARISON:  02/17/2021; 02/15/2021 FINDINGS: The lack of intravenous contrast limits the ability to evaluate solid abdominal organs. Lower chest: Limited visualization of the lower thorax demonstrates small to moderate-sized bilateral effusions with associated bibasilar consolidative opacities, similar to the 02/17/2021 examination. Cardiomegaly. Coronary artery calcifications. No pericardial effusion. Hepatobiliary: Normal hepatic contour. Normal noncontrast appearance of the gallbladder given underdistention. No radiopaque gallstones. No ascites. Pancreas: Normal noncontrast appearance of the pancreas. Spleen: Normal noncontrast appearance of the spleen. Adrenals/Urinary Tract: Normal noncontrast appearance the bilateral kidneys. Calcifications at the level of the right renal hila are favored to be vascular  in etiology. No definite renal stones. No renal stones are seen along expected course of either ureter or the urinary bladder. Normal noncontrast appearance of the urinary bladder given degree of distention. Note is made of mild left-sided pelviectasis and mild ureterectasis involving the superior aspect of the left ureter presumably secondary to the inflammatory process within the left hemipelvis. No evidence of right-sided urinary obstruction. No perinephric stranding. Normal noncontrast appearance of the bilateral adrenal glands. Stomach/Bowel: The overall size of the known presumed diverticular abscess involving the sigmoid colon and left hemipelvis is unchanged to slightly increased compared to the 02/17/2021 examination measuring at least 5.6 x 2.6 x 9.4 cm (axial image 68, series 3; sagittal image 76, series 7), though direct measurements are  difficult secondary to the irregular shape of the collection on this noncontrast examination, previously, 2.9 x 1.8 x 9.1 cm. There now appears to be less fluid and more air within the collection. The collection again appears intimately apposed with the left-sided adnexal structures. No definitive new definable/drainable fluid collection on this noncontrast examination. Ingested enteric contrast extends to the level of the mid small bowel. There is a large stool burden within the rectal vault and distal sigmoid colon. Previously noted small bowel distension has improved in the interval. No evidence of enteric obstruction. Normal noncontrast appearance of the terminal ileum and retrocecal appendix. No pneumoperitoneum, pneumatosis or portal venous gas. Vascular/Lymphatic: Large amount of calcified atherosclerotic plaque within the abdominal aorta and pelvic vasculature. No bulky retroperitoneal, mesenteric, pelvic or inguinal lymphadenopathy on this noncontrast examination. Reproductive: As above, the perforated diverticular abscess appears intimately apposed with the left sided adnexal structures. Otherwise, normal noncontrast appearance of the pelvic organs for age. No discrete right-sided adnexal lesions. No significant free fluid in the pelvic cul-de-sac. Other: Diffuse body wall anasarca likely progressed in the interval. Musculoskeletal: No acute or aggressive osseous abnormalities. Stigmata dish within the lower thoracic and upper lumbar spine. Mild-to-moderate multilevel lumbar spine DDD, worse at L5-S1 with disc space height loss, endplate irregularity and sclerosis. Mild degenerative change the bilateral hips with joint space loss, subchondral sclerosis and osteophytosis. IMPRESSION: 1. No change to slight increase in size of known presumed diverticular abscess involving the left hemipelvis and presumably involving the left adnexal structures. The amount of air within the collection has increased while the  amount of fluid has reduced. If patient is symptoms are not improving with continued conservative management and there is a desire to attempt percutaneous aspiration and/or drainage catheter placement, would recommend further evaluation with IV contrast-enhanced CT scan of the abdomen and pelvis for procedural planning purposes (which may be performed immediately prior to attempted drainage catheter placement). 2. Mild left-sided pelviectasis and ureterectasis presumably secondary to the infectious/inflammatory process within the left hemipelvis. 3. Interval improvement in previously noted small bowel distension. Large colonic stool burden without evidence of enteric obstruction. 4. Coronary calcifications.  Aortic Atherosclerosis (ICD10-I70.0). 5. Cardiomegaly with findings of pulmonary edema with small to moderate sized bilateral pleural effusions and associated diffuse body wall anasarca, unchanged to slightly progressed since the 05/25 examination. Electronically Signed   By: Sandi Mariscal M.D.   On: 02/21/2021 11:06   CT ABDOMEN PELVIS W CONTRAST  Result Date: 02/17/2021 CLINICAL DATA:  Diverticulitis with complication suspected. EXAM: CT ABDOMEN AND PELVIS WITH CONTRAST TECHNIQUE: Multidetector CT imaging of the abdomen and pelvis was performed using the standard protocol following bolus administration of intravenous contrast. CONTRAST:  9mL OMNIPAQUE IOHEXOL 300 MG/ML  SOLN COMPARISON:  Two  days ago FINDINGS: Lower chest: Moderate layering pleural effusions with lower lobe atelectasis. Hepatobiliary: Heterogeneous liver perfusion, suspect congestion given nutmeg appearance in this patient with dilated atria.No evidence of biliary obstruction or stone. Pancreas: Generalized atrophy.  No acute finding. Spleen: Unremarkable. Adrenals/Urinary Tract: Negative adrenals. No hydronephrosis or stone. Unremarkable bladder. Stomach/Bowel: Sigmoid diverticulitis with perforation that is contiguous with a gas and fluid  rim enhancing collection extending inferiorly and leftward and measuring up to 5.8 x 2.3 cm at its largest along the left pelvic sidewall. There is a superiorly extending component which measures approximately 3 cm. In total the collection appears continuous with a 9 cm craniocaudal span. No pneumoperitoneum. Vascular/Lymphatic: Extensive atheromatous calcification of the aorta, iliacs, and branch vessels. No mass or adenopathy. Reproductive:No acute finding. Other: No ascites or pneumoperitoneum. Musculoskeletal: No acute abnormalities. Generalized spinal degeneration IMPRESSION: 1. Unchanged diverticular abscess in the left lower quadrant and left hemipelvis, measuring approximately 9 cm in length by 3 cm in diameter. 2. Heterogeneous liver perfusion, likely cardiac and congestive. There are now moderate pleural effusions with bilateral lower lobe atelectasis. Electronically Signed   By: Monte Fantasia M.D.   On: 02/17/2021 11:32   DG CHEST PORT 1 VIEW  Result Date: 02/20/2021 CLINICAL DATA:  Wheezing. EXAM: PORTABLE CHEST 1 VIEW COMPARISON:  Feb 06, 2015 FINDINGS: Enlarged cardiac silhouette. Calcific atherosclerotic disease of the aorta and tortuosity. There is lower lobe predominant interstitial thickening and bilateral pleural effusions. No definite focal airspace consolidation, or pneumothorax. Osseous structures are without acute abnormality. Soft tissues are grossly normal. IMPRESSION: 1. Enlarged cardiac silhouette. 2. Lower lobe predominant interstitial thickening and bilateral pleural effusions. This likely represents interstitial pulmonary edema. Electronically Signed   By: Fidela Salisbury M.D.   On: 02/20/2021 11:48   ECHOCARDIOGRAM COMPLETE  Result Date: 02/17/2021    ECHOCARDIOGRAM REPORT   Patient Name:   ADDY MCMANNIS Date of Exam: 02/17/2021 Medical Rec #:  [id number]       Height:       63.0 in Accession #:    [id number]      Weight:       114.0 lb Date of Birth:  02-Dec-1927         BSA:          1.523 m Patient Age:    82 years        BP:           157/51 mmHg Patient Gender: F               HR:           79 bpm. Exam Location:  Inpatient Procedure: 2D Echo, Cardiac Doppler and Color Doppler Indications:    Aortic regurgitation  History:        Patient has prior history of Echocardiogram examinations, most                 recent 02/07/2015. Risk Factors:Hypertension.  Sonographer:    Cammy Brochure Referring Phys: [id number] NISCHAL NARENDRA IMPRESSIONS  1. Left ventricular ejection fraction, by estimation, is 60 to 65%. The left ventricle has normal function. The left ventricle has no regional wall motion abnormalities. Left ventricular diastolic function could not be evaluated.  2. Right ventricular systolic function is normal. The right ventricular size is normal. There is moderately elevated pulmonary artery systolic pressure.  3. Left atrial size was severely dilated.  4. Right atrial size was severely dilated.  5. The mitral valve is normal in  structure. Moderate mitral valve regurgitation. No evidence of mitral stenosis.  6. Tricuspid valve regurgitation is moderate to severe.  7. The aortic valve is tricuspid. There is mild calcification of the aortic valve. Aortic valve regurgitation is moderate. Mild aortic valve sclerosis is present, with no evidence of aortic valve stenosis.  8. The inferior vena cava is dilated in size with >50% respiratory variability, suggesting right atrial pressure of 8 mmHg. Comparison(s): Prior images unable to be directly viewed, comparison made by report only. Multiple valve problems have worsened since 2016 and there is a marked increase in the severity of pulmonary hypertension. FINDINGS  Left Ventricle: Left ventricular ejection fraction, by estimation, is 60 to 65%. The left ventricle has normal function. The left ventricle has no regional wall motion abnormalities. The left ventricular internal cavity size was normal in size. There is  no left  ventricular hypertrophy. Left ventricular diastolic function could not be evaluated due to atrial fibrillation. Left ventricular diastolic function could not be evaluated. Right Ventricle: The right ventricular size is normal. No increase in right ventricular wall thickness. Right ventricular systolic function is normal. There is moderately elevated pulmonary artery systolic pressure. The tricuspid regurgitant velocity is 3.68 m/s, and with an assumed right atrial pressure of 8 mmHg, the estimated right ventricular systolic pressure is 76.8 mmHg. Left Atrium: Left atrial size was severely dilated. Right Atrium: Right atrial size was severely dilated. Pericardium: There is no evidence of pericardial effusion. Mitral Valve: The mitral valve is normal in structure. Moderate mitral valve regurgitation, with centrally-directed jet. No evidence of mitral valve stenosis. Tricuspid Valve: The tricuspid valve is normal in structure. Tricuspid valve regurgitation is moderate to severe. No evidence of tricuspid stenosis. Aortic Valve: The aortic valve is tricuspid. There is mild calcification of the aortic valve. Aortic valve regurgitation is moderate. Mild aortic valve sclerosis is present, with no evidence of aortic valve stenosis. Aortic valve mean gradient measures 3.0 mmHg. Aortic valve peak gradient measures 5.4 mmHg. Aortic valve area, by VTI measures 2.74 cm. Pulmonic Valve: The pulmonic valve was not well visualized. Pulmonic valve regurgitation is not visualized. No evidence of pulmonic stenosis. Aorta: The aortic root is normal in size and structure. Venous: The inferior vena cava is dilated in size with greater than 50% respiratory variability, suggesting right atrial pressure of 8 mmHg. IAS/Shunts: No atrial level shunt detected by color flow Doppler.  LEFT VENTRICLE PLAX 2D LVIDd:         4.60 cm LVIDs:         2.80 cm LV PW:         1.00 cm LV IVS:        1.00 cm LVOT diam:     1.90 cm LV SV:         59 LV SV  Index:   39 LVOT Area:     2.84 cm  RIGHT VENTRICLE             IVC RV Basal diam:  4.00 cm     IVC diam: 1.70 cm RV S prime:     10.10 cm/s LEFT ATRIUM             Index       RIGHT ATRIUM           Index LA diam:        3.80 cm 2.50 cm/m  RA Area:     22.80 cm LA Vol (A2C):   81.5 ml 53.52 ml/m RA  Volume:   68.10 ml  44.72 ml/m LA Vol (A4C):   65.4 ml 42.95 ml/m LA Biplane Vol: 77.4 ml 50.83 ml/m  AORTIC VALVE AV Area (Vmax):    3.03 cm AV Area (Vmean):   2.76 cm AV Area (VTI):     2.74 cm AV Vmax:           116.33 cm/s AV Vmean:          81.467 cm/s AV VTI:            0.215 m AV Peak Grad:      5.4 mmHg AV Mean Grad:      3.0 mmHg LVOT Vmax:         124.33 cm/s LVOT Vmean:        79.167 cm/s LVOT VTI:          0.208 m LVOT/AV VTI ratio: 0.97  AORTA Ao Root diam: 3.00 cm Ao Asc diam:  2.50 cm MITRAL VALVE                TRICUSPID VALVE MV Area (PHT): 4.89 cm     TR Peak grad:   54.2 mmHg MV Decel Time: 155 msec     TR Vmax:        368.00 cm/s MV E velocity: 115.00 cm/s                             SHUNTS                             Systemic VTI:  0.21 m                             Systemic Diam: 1.90 cm Mihai Croitoru MD Electronically signed by Sanda Klein MD Signature Date/Time: 02/17/2021/1:51:28 PM    Final    CT Angio Abd/Pel W and/or Wo Contrast  Result Date: 02/15/2021 CLINICAL DATA:  Mesenteric ischemia Acute atrial fibrillation Elevated lactic acid Abdominal pain EXAM: CTA ABDOMEN AND PELVIS WITHOUT AND WITH CONTRAST TECHNIQUE: Multidetector CT imaging of the abdomen and pelvis was performed using the standard protocol during bolus administration of intravenous contrast. Multiplanar reconstructed images and MIPs were obtained and reviewed to evaluate the vascular anatomy. CONTRAST:  38mL OMNIPAQUE IOHEXOL 350 MG/ML SOLN COMPARISON:  None. FINDINGS: VASCULAR Aorta: Diffuse atherosclerotic changes of the abdominal aorta with mild ectasia of the infrarenal segment measuring up to 1.4 cm.  Celiac: No significant stenosis of the celiac artery. SMA: No significant stenosis. The right hepatic artery is replaced, originating from the SMA. Renals: No significant stenosis. IMA: Patent. Inflow: Mild diffuse narrowing of the common iliac arteries due to diffusely calcified plaque. Bilateral internal iliac arteries are patent. No significant narrowing of the external iliac arteries. Proximal Outflow: Bilateral common femoral and visualized portions of the superficial and profunda femoral arteries are patent without evidence of aneurysm, dissection, vasculitis or significant stenosis. Veins: Hepatic, portal, splenic, superior mesenteric veins are patent. Review of the MIP images confirms the above findings. NON-VASCULAR Lower chest: Trace right pleural effusion.  Bibasilar atelectasis. Hepatobiliary: Diffuse heterogeneous enhancement of the liver without discrete mass or biliary dilatation. Is gallbladder is collapsed. Pancreas: Unremarkable. No pancreatic ductal dilatation or surrounding inflammatory changes. Spleen: Normal in size without focal abnormality. Adrenals/Urinary Tract: Adrenal glands are normal. Subcentimeter renal hypodensities are too small to fully  characterize, however statistically most likely represent small simple cysts. No hydronephrosis or hydroureter. Bladder is normal in appearance. Stomach/Bowel: No bowel dilatation to indicate ileus or obstruction. Appendix is normal. There is diverticulosis of the descending and sigmoid colon. There is acute inflammation of the sigmoid colon likely due to diverticulitis. Multiple foci of extraluminal air is noted. There is extensive stranding of the Peri sigmoid colon fat. There are multiple collections of fluid/air with the largest located in the left lateral pelvis measuring 5.9 x 1.9 x 2.2 cm which is suspicious for abscess. Multiple additional smaller foci of air and fluid also noted within the pelvis. Lymphatic: No enlarged lymph nodes.  Reproductive: Evaluation of the pelvic organs is difficult as the uterus and ovaries are not discernible independent of the inflammatory changes. Other: Small ovoid fluid collection noted in the left anterior pelvis subcutaneous tissues measuring 2.2 x 2.1 x 1.3 cm. This may be an additional small abscess. Musculoskeletal: No acute or significant osseous findings. IMPRESSION: VASCULAR No significant stenosis of the mesenteric arteries or veins. NON-VASCULAR 1. Marked inflammatory changes of the pelvis, likely due to perforated sigmoid colon diverticulitis. Multiple small collections of fluid and air suspicious for abscesses. The largest is located in the left lateral pelvis measuring 5.9 x 1.9 x 2.2 cm. Small amounts of extraluminal air noted within the pelvis indicative of perforation. 2. Diffuse heterogeneous enhancement of the liver without discrete mass. Findings are nonspecific, however most likely due to vascular congestion or hepatitis. These results were called by telephone at the time of interpretation on 02/15/2021 at 1:55 pm to provider Makemie Park , who verbally acknowledged these results. Electronically Signed   By: Miachel Roux M.D.   On: 02/15/2021 13:55    Labs:  CBC: Recent Labs    02/18/21 0541 02/19/21 0551 02/20/21 0420 02/21/21 0614  WBC 27.6* 28.5* 30.2* 27.7*  HGB 10.6* 10.8* 10.7* 10.4*  HCT 30.7* 30.1* 29.9* 29.7*  PLT 300 321 356 360    COAGS: Recent Labs    02/16/21 0025  INR 1.8*  APTT 38*    BMP: Recent Labs    02/20/21 0420 02/20/21 1440 02/20/21 2117 02/21/21 0614  NA 129* 131* 131* 130*  K 3.6 3.6 3.9 4.1  CL 104 107 104 102  CO2 20* 20* 21* 23  GLUCOSE 113* 116* 108* 127*  BUN <5* <5* <5* <5*  CALCIUM 7.9* 8.0* 7.9* 7.9*  CREATININE 0.57 0.53 0.57 0.49  GFRNONAA >60 >60 >60 >60    LIVER FUNCTION TESTS: Recent Labs    02/15/21 1030 02/15/21 2019 02/16/21 0025  BILITOT 3.1* 3.3* 3.3*  AST 25  --  19  ALT 20  --  18  ALKPHOS 101  --  109   PROT 5.7*  --  4.6*  ALBUMIN 2.8*  --  2.3*   Assessment and Plan:  87 y.o. female inpatient. History of a fib (on eliquis) HTN, Presented [facility] the ED at [facility]  On 5.23.22 with abdominal pain nausea and vomiting. X several days with accompanied fever. CT Abd pelvis from 5.29.22 reads No change to slight increase in size of known presumed diverticular abscess involving the left hemipelvis and presumably involving the left adnexal structures. The amount of air within the collection has increased while the amount of fluid has reduced. If patient is symptoms are not improving with continued conservative management and there is a desire to attempt percutaneous aspiration and/or drainage catheter placement, would recommend further evaluation with IV contrast-enhanced CT scan  of the abdomen and pelvis for procedural planning purposes (which may be performed immediately prior to attempted drainage catheter placement). Team is requesting abcess drain placement to the colonic diverticular abscess.   WBC is 27.7, BUN < 5, GFR >60. All other labs and medications are within acceptable parameters. NKDA.   IR consulted for possible colonic diverticular abscess drain placement. Case has been reviewed and procedure approved by Dr. Serafina Royals to be performed under CT with IV contrast.  Patient tentatively scheduled for 5.29.22.  Team instructed to: Keep Patient to be NPO after midnight  IR will call patient when ready.  Risks and benefits discussed with the patient including bleeding, infection, damage to adjacent structures, bowel perforation/fistula connection, and sepsis.  All of the patient's questions were answered, patient is agreeable to proceed. Consent signed and in chart.    Thank you for this interesting consult.  I greatly enjoyed meeting [facility] and look forward to participating in their care.  A copy of this report was sent to the requesting provider on this date.  Electronically  Signed: Jacqualine Mau, NP 02/21/2021, 12:33 PM   I spent a total of 40 Minutes    in face to face in clinical consultation, greater than 50% of which was counseling/coordinating care for diverticular abscess drain placement.

## 2021-02-21 NOTE — Progress Notes (Signed)
Subjective: HD 6 Overnight, no acute events reported.   This morning, Daisy Williams was evaluated at bedside. She is resting comfortably in bed, was able to eat some of her breakfast. She denies any abdominal pain, nausea/vomiting at this time. She notes possibly having a small bowel movement a few days ago but none since then.   Objective:  Vital signs in last 24 hours: Vitals:   02/20/21 1452 02/20/21 2021 02/21/21 0500 02/21/21 0737  BP:  (!) 136/52  (!) 161/64  Pulse:  81  97  Resp:  20  18  Temp:  98.3 F (36.8 C)  98.4 F (36.9 C)  TempSrc:  Oral  Oral  SpO2: 95% 96%  95%  Weight:   67.8 kg   Height:       CBC Latest Ref Rng & Units 02/21/2021 02/20/2021 02/19/2021  WBC 4.0 - 10.5 K/uL 27.7(H) 30.2(H) 28.5(H)  Hemoglobin 12.0 - 15.0 g/dL 10.4(L) 10.7(L) 10.8(L)  Hematocrit 36.0 - 46.0 % 29.7(L) 29.9(L) 30.1(L)  Platelets 150 - 400 K/uL 360 356 321   BMP Latest Ref Rng & Units 02/21/2021 02/20/2021 02/20/2021  Glucose 70 - 99 mg/dL 127(H) 108(H) 116(H)  BUN 8 - 23 mg/dL <5(L) <5(L) <5(L)  Creatinine 0.44 - 1.00 mg/dL 0.49 0.57 0.53  Sodium 135 - 145 mmol/L 130(L) 131(L) 131(L)  Potassium 3.5 - 5.1 mmol/L 4.1 3.9 3.6  Chloride 98 - 111 mmol/L 102 104 107  CO2 22 - 32 mmol/L 23 21(L) 20(L)  Calcium 8.9 - 10.3 mg/dL 7.9(L) 7.9(L) 8.0(L)    Physical Exam  Constitutional: elderly frail appearing female resting comfortably in bed, no acute distress  HENT: Normocephalic and atraumatic, EOMI, moist mucous membranes Cardiovascular: tachycardic, irregularly irregular S1 and S2 present, no murmurs, rubs, gallops.  Distal pulses intact Respiratory: Mild tachypnea with diminished breath sounds at bilateral bases; no wheezing or rales or rhonchi appreciated; on room air  GI: Nondistended, soft, nontender to palpation, normoactive bowel sounds Musculoskeletal: Normal bulk and tone.  Bilateral lower extremity nonpitting edema  Neurological: Is alert and oriented x4, no apparent  focal deficits noted. Skin: Warm and dry.  No rash, erythema, lesions noted. Psychiatric: Normal mood and affect. Behavior is normal. Judgment and thought content normal.   Assessment/Plan:  Principal Problem:   Sepsis ([place]) Active Problems:   Urinary retention   Diverticular disease of intestine with perforation and abscess   Hyponatremia   Aortic atherosclerosis (HCC)   Wheezing  Daisy Kalifa Cadden is a 87 year old female with PMHx of atrial fibrillation on Eliquis, hypertension and glaucoma admitted for severe sepsis secondary to perforated colonic diverticulitis with multiple abscesses and hyponatremia, improving.   Perforated colonic diverticulitis with multiple abscesses Clinically, patient appears to be doing better. She is tolerating soft food diet and denies any abdominal pain. She reports not having a bowel movement yet but abdomen does not appear to be distended, firm or tender. White count slightly improved this morning 30.2>27.7. No further episodes of hypotension but does have intermittent tachycardia to 110s.  - Repeat CT Abdomen/Pelvis today to reassess possibility of drain placement - Appreciate surgery, IR and palliative care assistance and recommendations - Continue IV Zosyn daily - Blood cultures NGTD - CBC daily  Hypotonic hyponatremia, improving Na 130-131 overnight and this morning. She has persistent bilateral lower extremity nonpitting edema with diminished lung sounds at bilateral lung bases. She is only reported to have ~430cc IV fluids over past 24 hours. As she is able  to tolerate PO intake, will hold off on further IV fluid resuscitation at this time . - Encourage PO intake  - Daily BMP  - Strict I&O and daily weights    Atrial fibrillation Patient with history of Afib on Eliquis and rate controlled with metoprolol continues to have exam consistent with atrial fibrillation; however, intermittent tachycardia to 110's. BP has improved with SBP 140-160s.  Patient's metoprolol has been held due to initial presentation with hypotension; however, suspect that her intermittent tachycardia and hypertension may be from holding metoprolol. - Resume metoprolol 12.5mg  bid  - Continue cardiac monitoring   Hx of emphysema Patient received duonebs yesterday for inspiratory wheezing on exam. CXR yesterday with bilateral lower lobe interstitial thickening and pleural effusions. IV fluids held and patient started on duonebs. This morning, lung sounds improved and no further wheezing appreciated on anterior auscultation of chest.  - Duonebs tid prn  Code status: DNR Fluids:  Diet: Soft DVT Prophylaxis: SCDs  Prior to Admission Living Arrangement: Home  Anticipated Discharge Location: Home w/HH  Barriers to Discharge: Continued medical management  Dispo: Anticipated discharge in approximately 3-4 day(s).   Harvie Heck, MD  [facility] PGY-2 02/21/2021, 9:41 AM Pager: [phone] After 5pm on weekdays and 1pm on weekends: On Call pager [phone]

## 2021-02-21 NOTE — Progress Notes (Addendum)
Subjective/Chief Complaint: No new changes.  Just had a large bm and getting cleaned up.  Still denies abdominal pain  Objective: Vital signs in last 24 hours: Temp:  [98.1 F (36.7 C)-98.4 F (36.9 C)] 98.4 F (36.9 C) (05/29 0737) Pulse Rate:  [77-97] 97 (05/29 0737) Resp:  [18-20] 18 (05/29 0737) BP: (136-161)/(52-65) 161/64 (05/29 0737) SpO2:  [95 %-96 %] 95 % (05/29 0737) Weight:  [67.8 kg] 67.8 kg (05/29 0500) Last BM Date: 02/17/21  Intake/Output from previous day: 05/28 0701 - 05/29 0700 In: 436.6 [I.V.:86; IV Piggyback:350.5] Out: 2000 [Urine:2000] Intake/Output this shift: Total I/O In: -  Out: 850 [Urine:850]  PE: Abd: soft, NT, ND, +BS  Lab Results:  Recent Labs    02/20/21 0420 02/21/21 0614  WBC 30.2* 27.7*  HGB 10.7* 10.4*  HCT 29.9* 29.7*  PLT 356 360   BMET Recent Labs    02/20/21 2117 02/21/21 0614  NA 131* 130*  K 3.9 4.1  CL 104 102  CO2 21* 23  GLUCOSE 108* 127*  BUN <5* <5*  CREATININE 0.57 0.49  CALCIUM 7.9* 7.9*   PT/INR No results for input(s): LABPROT, INR in the last 72 hours. ABG No results for input(s): PHART, HCO3 in the last 72 hours.  Invalid input(s): PCO2, PO2  Studies/Results: DG CHEST PORT 1 VIEW  Result Date: 02/20/2021 CLINICAL DATA:  Wheezing. EXAM: PORTABLE CHEST 1 VIEW COMPARISON:  Feb 06, 2015 FINDINGS: Enlarged cardiac silhouette. Calcific atherosclerotic disease of the aorta and tortuosity. There is lower lobe predominant interstitial thickening and bilateral pleural effusions. No definite focal airspace consolidation, or pneumothorax. Osseous structures are without acute abnormality. Soft tissues are grossly normal. IMPRESSION: 1. Enlarged cardiac silhouette. 2. Lower lobe predominant interstitial thickening and bilateral pleural effusions. This likely represents interstitial pulmonary edema. Electronically Signed   By: Fidela Salisbury M.D.   On: 02/20/2021 11:48     Anti-infectives: Anti-infectives (From admission, onward)   Start     Dose/Rate Route Frequency Ordered Stop   02/17/21 2315  piperacillin-tazobactam (ZOSYN) IVPB 3.375 g        3.375 g 12.5 mL/hr over 240 Minutes Intravenous Every 8 hours 02/17/21 2224     02/15/21 1500  piperacillin-tazobactam (ZOSYN) IVPB 3.375 g  Status:  Discontinued        3.375 g 12.5 mL/hr over 240 Minutes Intravenous Every 8 hours 02/15/21 1412 02/17/21 2224   02/15/21 1230  ceFEPIme (MAXIPIME) 2 g in sodium chloride 0.9 % 100 mL IVPB  Status:  Discontinued        2 g 200 mL/hr over 30 Minutes Intravenous  Once 02/15/21 1221 02/15/21 1223   02/15/21 1230  metroNIDAZOLE (FLAGYL) IVPB 500 mg        500 mg 100 mL/hr over 60 Minutes Intravenous  Once 02/15/21 1221 02/15/21 1409   02/15/21 1230  ceFEPIme (MAXIPIME) 2 g in sodium chloride 0.9 % 100 mL IVPB  Status:  Discontinued        2 g 200 mL/hr over 30 Minutes Intravenous Every 12 hours 02/15/21 1223 02/15/21 1412      Assessment/Plan: HTN A. Fib on eliquis- on hold Postural dizziness Glaucoma  Hyponatremia/hypokalemia - per medicine for replacement Possible UTI - UA with +leukocytes and many bacteria, cx negative Hyperbilirubinemia - unclear etiology.  Other LFTs unremarkable.  Hepatitis panel is normal.  Perforated sigmoid diverticulitis with abscesses - abscess noted on scan unable to be drained by IR -cont zosyn for now -WBC remains elevated,  but slightly downtrending today.  Await new CT scan to see if there is anything else that can be offered to the patient to help with this process. -soft diet as she tolerates   FEN: soft, IVF VTE: SCDs, hold eliquis ID: [email or web address] 5/23; Zosyn 5/23>>  ADDENDUM 12:32pm CT scan reviewed and reviewed with Dr. Serafina Royals with IR.  Will try to place a drain vs aspiration tomorrow.  I have called the patient and discussed this as well.  She is agreeable to try and see if this will help her improve. Henreitta Cea 02/21/2021

## 2021-02-21 NOTE — Progress Notes (Signed)
Md called and asked for BP recheck this morning BP 182/102 and HR 123 PRN Hydralazine was given. Recovery BP 165/58 and HR 96

## 2021-02-21 NOTE — Progress Notes (Signed)
Received call from grandson [place].   He understand the drain will be placed for diverticular abscess tomorrow.   He asked if we still needed to have a Palliative meeting.    I suggested that it would still be good to meet perhaps after the procedure.   We could discuss how the procedure went and the "what ifs" and next steps.   Audelia Acton asked if the family could join him for that meeting.   I felt that would be a very good idea.  The family including patient's husband, daughter, grandson Audelia Acton) and grand daughter, as well as the great grand daughter, will come to the hospital at 11:00 on Tuesday 02/23/21 to meet with Palliative Medicine.  Florentina Jenny, PA-C Palliative Medicine Office:  [phone]  No charge note.

## 2021-02-22 ENCOUNTER — Inpatient Hospital Stay (HOSPITAL_COMMUNITY): Payer: Medicare Other

## 2021-02-22 LAB — CBC WITH DIFFERENTIAL/PLATELET
Abs Immature Granulocytes: 2.2 10*3/uL — ABNORMAL HIGH (ref 0.00–0.07)
Basophils Absolute: 0.2 10*3/uL — ABNORMAL HIGH (ref 0.0–0.1)
Basophils Relative: 1 %
Eosinophils Absolute: 0.7 10*3/uL — ABNORMAL HIGH (ref 0.0–0.5)
Eosinophils Relative: 3 %
HCT: 27.8 % — ABNORMAL LOW (ref 36.0–46.0)
Hemoglobin: 9.6 g/dL — ABNORMAL LOW (ref 12.0–15.0)
Lymphocytes Relative: 3 %
Lymphs Abs: 0.7 10*3/uL (ref 0.7–4.0)
MCH: 39.3 pg — ABNORMAL HIGH (ref 26.0–34.0)
MCHC: 34.5 g/dL (ref 30.0–36.0)
MCV: 113.9 fL — ABNORMAL HIGH (ref 80.0–100.0)
Metamyelocytes Relative: 6 %
Monocytes Absolute: 2.4 10*3/uL — ABNORMAL HIGH (ref 0.1–1.0)
Monocytes Relative: 10 %
Myelocytes: 3 %
Neutro Abs: 17.8 10*3/uL — ABNORMAL HIGH (ref 1.7–7.7)
Neutrophils Relative %: 74 %
Platelets: 338 10*3/uL (ref 150–400)
RBC: 2.44 MIL/uL — ABNORMAL LOW (ref 3.87–5.11)
RDW: 14.4 % (ref 11.5–15.5)
WBC: 24 10*3/uL — ABNORMAL HIGH (ref 4.0–10.5)
nRBC: 0 /100 WBC
nRBC: 0.3 % — ABNORMAL HIGH (ref 0.0–0.2)

## 2021-02-22 LAB — COMPREHENSIVE METABOLIC PANEL
ALT: 24 U/L (ref 0–44)
AST: 28 U/L (ref 15–41)
Albumin: 1.8 g/dL — ABNORMAL LOW (ref 3.5–5.0)
Alkaline Phosphatase: 141 U/L — ABNORMAL HIGH (ref 38–126)
Anion gap: 5 (ref 5–15)
BUN: 6 mg/dL — ABNORMAL LOW (ref 8–23)
CO2: 24 mmol/L (ref 22–32)
Calcium: 7.9 mg/dL — ABNORMAL LOW (ref 8.9–10.3)
Chloride: 101 mmol/L (ref 98–111)
Creatinine, Ser: 0.58 mg/dL (ref 0.44–1.00)
GFR, Estimated: >60 mL/min (ref >60)
Glucose, Bld: 131 mg/dL — ABNORMAL HIGH (ref 70–99)
Potassium: 3.7 mmol/L (ref 3.5–5.1)
Sodium: 130 mmol/L — ABNORMAL LOW (ref 135–145)
Total Bilirubin: 0.7 mg/dL (ref 0.3–1.2)
Total Protein: 4.6 g/dL — ABNORMAL LOW (ref 6.5–8.1)

## 2021-02-22 LAB — PROTIME-INR
INR: 1.1 (ref 0.8–1.2)
Prothrombin Time: 14.3 seconds (ref 11.4–15.2)

## 2021-02-22 MED ORDER — IOHEXOL 300 MG/ML  SOLN
74.0000 mL | Freq: Once | INTRAMUSCULAR | Status: AC | PRN
  Administered 2021-02-22: 74 mL via INTRAVENOUS

## 2021-02-22 MED ORDER — FENTANYL CITRATE (PF) 100 MCG/2ML IJ SOLN
INTRAMUSCULAR | Status: AC
  Filled 2021-02-22: qty 2

## 2021-02-22 MED ORDER — MIDAZOLAM HCL 2 MG/2ML IJ SOLN
INTRAMUSCULAR | Status: AC | PRN
  Administered 2021-02-22: 0.5 mg via INTRAVENOUS

## 2021-02-22 MED ORDER — FENTANYL CITRATE (PF) 100 MCG/2ML IJ SOLN
INTRAMUSCULAR | Status: AC | PRN
  Administered 2021-02-22: 25 ug via INTRAVENOUS

## 2021-02-22 MED ORDER — LIDOCAINE HCL 1 % IJ SOLN
INTRAMUSCULAR | Status: AC
  Filled 2021-02-22: qty 20

## 2021-02-22 MED ORDER — MIDAZOLAM HCL 2 MG/2ML IJ SOLN
INTRAMUSCULAR | Status: AC
  Filled 2021-02-22: qty 2

## 2021-02-22 NOTE — Progress Notes (Signed)
Unable to obtain safe window for percutaneous drainage of pelvic abscess.  Multiple scans were obtained in different positions in hopes to allow a safe window.  This was discussed with the patient who verbalized understanding.  If the collection becomes larger percutaneous drainage maybe possible in the future.   Ruthann Cancer, MD Pager: [phone]

## 2021-02-22 NOTE — Progress Notes (Addendum)
Physical Therapy Treatment Patient Details Name: Daisy Williams MRN: [id number] DOB: [birth year] Today's Date: 02/22/2021    History of Present Illness Pt is a 87 y.o. female admitted 02/15/21 with abdominal pain, nausea, decreased appetite. CT angio abdomen/pelvis showed bowel changes and abscesses likely secondary to perforated sigmoid colon diverticulitis. Workup for sepsis, diverticulitis, possible UTI. IR did not find safe window for percutaneous drainage. Plan for medical management. Pt adamantly refusing any surgery. PMH includes glaucoma, inner ear dysfunction, afib on Eliquis.    PT Comments    Pt making steady progress. Have decided on rolling walker for home when pt is dc'd. Pt continues to be motivated to return to independence.    Follow Up Recommendations  Home health PT;Supervision for mobility/OOB     Equipment Recommendations  Rolling walker with 5" wheels    Recommendations for Other Services       Precautions / Restrictions Precautions Precautions: Fall Precaution Comments: watch HR Restrictions Weight Bearing Restrictions: No    Mobility  Bed Mobility            General bed mobility comments: Pt sitting up in chair    Transfers Overall transfer level: Needs assistance Equipment used: Rolling walker (2 wheeled) Transfers: Sit to/from Stand Sit to Stand: Min guard         General transfer comment: Verbal/tactile cues for hand placement. Incr time to rise  Ambulation/Gait Ambulation/Gait assistance: Min guard Gait Distance (Feet): 220 Feet Assistive device: Rolling walker (2 wheeled) Gait Pattern/deviations: Step-through pattern;Decreased stride length;Trunk flexed Gait velocity: decr Gait velocity interpretation: 1.31 - 2.62 ft/sec, indicative of limited community ambulator General Gait Details: Assist for safety.   Stairs             Wheelchair Mobility    Modified Rankin (Stroke Patients Only)       Balance Overall  balance assessment: Needs assistance Sitting-balance support: No upper extremity supported;Feet supported Sitting balance-Leahy Scale: Fair     Standing balance support: No upper extremity supported;During functional activity Standing balance-Leahy Scale: Fair                             Cognition Arousal/Alertness: Awake/alert Behavior During Therapy: WFL for tasks assessed/performed Overall Cognitive Status: Within Functional Limits for tasks assessed                                       Exercises      General Comments General comments (skin integrity, edema, etc.): VSS on RA      Pertinent Vitals/Pain Pain Assessment: No/denies pain    Home Living                      Prior Function            PT Goals (current goals can now be found in the care plan section) Acute Rehab PT Goals Patient Stated Goal: return home Progress towards PT goals: Progressing toward goals    Frequency    Min 3X/week      PT Plan Current plan remains appropriate    Co-evaluation              AM-PAC PT "6 Clicks" Mobility   Outcome Measure  Help needed turning from your back to your side while in a flat bed without using bedrails?: None Help needed moving from  lying on your back to sitting on the side of a flat bed without using bedrails?: A Little Help needed moving to and from a bed to a chair (including a wheelchair)?: A Little Help needed standing up from a chair using your arms (e.g., wheelchair or bedside chair)?: A Little Help needed to walk in hospital room?: A Little Help needed climbing 3-5 steps with a railing? : A Little 6 Click Score: 19    End of Session Equipment Utilized During Treatment: Gait belt Activity Tolerance: Patient tolerated treatment well Patient left: in chair;with call bell/phone within reach;with chair alarm set   PT Visit Diagnosis: Other abnormalities of gait and mobility (R26.89);Muscle weakness  (generalized) (M62.81)     Time: 0349-1791 PT Time Calculation (min) (ACUTE ONLY): 20 min  Charges:  $Gait Training: 8-22 mins                     [place] Pager [phone] Office [place] 02/22/2021, 4:20 PM

## 2021-02-22 NOTE — Progress Notes (Signed)
Occupational Therapy Treatment Patient Details Name: Daisy Williams MRN: [id number] DOB: 1927-11-12 Today's Date: 02/22/2021    History of present illness Pt is a 87 y.o. female admitted 02/15/21 with abdominal pain, nausea, decreased appetite. CT angio abdomen/pelvis showed bowel changes and abscesses likely secondary to perforated sigmoid colon diverticulitis. Workup for sepsis, diverticulitis, possible UTI. IR did not find safe window for percutaneous drainage. Plan for medical management. Pt adamantly refusing any surgery. PMH includes glaucoma, inner ear dysfunction, afib on Eliquis.   OT comments  Patient progressing toward goals this date. Found to be incontinent of bowel/bladder upon entry requiring Min A for hygiene/clothing management in supine and in standing. Patient progressed to recliner with Min guard and use of RW. Husband present at bedside throughout treatment session. Patient would benefit from continued acute OT services to maximize safety/independence with self-care tasks in prep for safe d/c home.     Follow Up Recommendations  Home health OT;Supervision - Intermittent    Equipment Recommendations  Tub/shower seat    Recommendations for Other Services      Precautions / Restrictions Precautions Precautions: Fall;Other (comment) Precaution Comments: watch HR Restrictions Weight Bearing Restrictions: No       Mobility Bed Mobility Overal bed mobility: Needs Assistance Bed Mobility: Supine to Sit     Supine to sit: Min assist;HOB elevated          Transfers Overall transfer level: Needs assistance Equipment used: Rolling walker (2 wheeled) Transfers: Sit to/from Stand Sit to Stand: Min guard         General transfer comment: Requires increased time for all functional transfers.    Balance Overall balance assessment: Needs assistance Sitting-balance support: No upper extremity supported;Feet supported Sitting balance-Leahy Scale: Fair      Standing balance support: No upper extremity supported;During functional activity Standing balance-Leahy Scale: Fair Standing balance comment: HHA +1 to maintain static standing during hygiene/clothing management in standing.                           ADL either performed or assessed with clinical judgement   ADL Overall ADL's : Needs assistance/impaired             Lower Body Bathing: Minimal assistance;Sit to/from stand Lower Body Bathing Details (indicate cue type and reason): Patient found to be incontinent of bowel/bladder upon entry. Min A for thoroughness for hygiene/clothing management in supine/standing     Lower Body Dressing: Minimal assistance;Sit to/from stand Lower Body Dressing Details (indicate cue type and reason): Min A to don LB clothing in sitting/standing                     Vision       Perception     Praxis      Cognition Arousal/Alertness: Awake/alert Behavior During Therapy: WFL for tasks assessed/performed Overall Cognitive Status: Within Functional Limits for tasks assessed                                 General Comments: endorses some short term memory deficits but [facility]        Exercises     Shoulder Instructions       General Comments      Pertinent Vitals/ Pain       Pain Assessment: No/denies pain  Home Living  Prior Functioning/Environment              Frequency  Min 2X/week        Progress Toward Goals  OT Goals(current goals can now be found in the care plan section)  Progress towards OT goals: Progressing toward goals  Acute Rehab OT Goals Patient Stated Goal: return home OT Goal Formulation: With patient Time For Goal Achievement: 03/02/21 Potential to Achieve Goals: Good ADL Goals Pt Will Perform Grooming: with supervision;with set-up;standing Pt Will Perform Upper Body Bathing: with set-up;sitting Pt Will  Perform Lower Body Bathing: with min guard assist;with supervision Pt Will Perform Upper Body Dressing: with set-up;sitting Pt Will Perform Lower Body Dressing: with min guard assist;with supervision Pt Will Transfer to Toilet: with supervision;ambulating Pt Will Perform Toileting - Clothing Manipulation and hygiene: with min guard assist  Plan Discharge plan remains appropriate    Co-evaluation                 AM-PAC OT "6 Clicks" Daily Activity     Outcome Measure   Help from another person eating meals?: None Help from another person taking care of personal grooming?: A Little Help from another person toileting, which includes using toliet, bedpan, or urinal?: A Little Help from another person bathing (including washing, rinsing, drying)?: A Little Help from another person to put on and taking off regular upper body clothing?: None Help from another person to put on and taking off regular lower body clothing?: A Little 6 Click Score: 20    End of Session Equipment Utilized During Treatment: Gait belt;Rolling walker  OT Visit Diagnosis: Unsteadiness on feet (R26.81);Other abnormalities of gait and mobility (R26.89);Muscle weakness (generalized) (M62.81)   Activity Tolerance Patient tolerated treatment well   Patient Left in chair;with call bell/phone within reach;with chair alarm set;with family/visitor present   Nurse Communication Mobility status        Time: 9604-5409 OT Time Calculation (min): 28 min  Charges: OT General Charges $OT Visit: 1 Visit OT Treatments $Self Care/Home Management : 23-37 mins  Derren Suydam H. OTR/L Supplemental OT, Department of rehab services [phone]   Davontay Watlington R H. 02/22/2021, 1:37 PM

## 2021-02-22 NOTE — Progress Notes (Signed)
Subjective/Chief Complaint: No new changes.  Having several large BMs.  Looks better today  Objective: Vital signs in last 24 hours: Temp:  [97.6 F (36.4 C)-98.8 F (37.1 C)] 98.8 F (37.1 C) (05/30 0802) Pulse Rate:  [75-96] 83 (05/30 0802) Resp:  [17-20] 17 (05/30 0802) BP: (104-157)/(46-108) 143/108 (05/30 0802) SpO2:  [90 %-95 %] 90 % (05/30 0802) Weight:  [67.8 kg] 67.8 kg (05/30 0547) Last BM Date: 02/20/21  Intake/Output from previous day: 05/29 0701 - 05/30 0700 In: 170 [P.O.:120; IV Piggyback:50] Out: 3300 [Urine:3300] Intake/Output this shift: No intake/output data recorded.  PE: Gen: NAD, laying in bed Abd: soft, NT, ND, +BS  Lab Results:  Recent Labs    02/21/21 0614 02/22/21 0048  WBC 27.7* 24.0*  HGB 10.4* 9.6*  HCT 29.7* 27.8*  PLT 360 338   BMET Recent Labs    02/21/21 0614 02/22/21 0048  NA 130* 130*  K 4.1 3.7  CL 102 101  CO2 23 24  GLUCOSE 127* 131*  BUN <5* 6*  CREATININE 0.49 0.58  CALCIUM 7.9* 7.9*   PT/INR Recent Labs    02/22/21 0048  LABPROT 14.3  INR 1.1   ABG No results for input(s): PHART, HCO3 in the last 72 hours.  Invalid input(s): PCO2, PO2  Studies/Results: CT ABDOMEN PELVIS WO CONTRAST  Result Date: 02/21/2021 CLINICAL DATA:  Concern for diverticulitis. EXAM: CT ABDOMEN AND PELVIS WITHOUT CONTRAST TECHNIQUE: Multidetector CT imaging of the abdomen and pelvis was performed following the standard protocol without IV contrast. COMPARISON:  02/17/2021; 02/15/2021 FINDINGS: The lack of intravenous contrast limits the ability to evaluate solid abdominal organs. Lower chest: Limited visualization of the lower thorax demonstrates small to moderate-sized bilateral effusions with associated bibasilar consolidative opacities, similar to the 02/17/2021 examination. Cardiomegaly. Coronary artery calcifications. No pericardial effusion. Hepatobiliary: Normal hepatic contour. Normal noncontrast appearance of the gallbladder  given underdistention. No radiopaque gallstones. No ascites. Pancreas: Normal noncontrast appearance of the pancreas. Spleen: Normal noncontrast appearance of the spleen. Adrenals/Urinary Tract: Normal noncontrast appearance the bilateral kidneys. Calcifications at the level of the right renal hila are favored to be vascular in etiology. No definite renal stones. No renal stones are seen along expected course of either ureter or the urinary bladder. Normal noncontrast appearance of the urinary bladder given degree of distention. Note is made of mild left-sided pelviectasis and mild ureterectasis involving the superior aspect of the left ureter presumably secondary to the inflammatory process within the left hemipelvis. No evidence of right-sided urinary obstruction. No perinephric stranding. Normal noncontrast appearance of the bilateral adrenal glands. Stomach/Bowel: The overall size of the known presumed diverticular abscess involving the sigmoid colon and left hemipelvis is unchanged to slightly increased compared to the 02/17/2021 examination measuring at least 5.6 x 2.6 x 9.4 cm (axial image 68, series 3; sagittal image 76, series 7), though direct measurements are difficult secondary to the irregular shape of the collection on this noncontrast examination, previously, 2.9 x 1.8 x 9.1 cm. There now appears to be less fluid and more air within the collection. The collection again appears intimately apposed with the left-sided adnexal structures. No definitive new definable/drainable fluid collection on this noncontrast examination. Ingested enteric contrast extends to the level of the mid small bowel. There is a large stool burden within the rectal vault and distal sigmoid colon. Previously noted small bowel distension has improved in the interval. No evidence of enteric obstruction. Normal noncontrast appearance of the terminal ileum and retrocecal appendix. No  pneumoperitoneum, pneumatosis or portal venous  gas. Vascular/Lymphatic: Large amount of calcified atherosclerotic plaque within the abdominal aorta and pelvic vasculature. No bulky retroperitoneal, mesenteric, pelvic or inguinal lymphadenopathy on this noncontrast examination. Reproductive: As above, the perforated diverticular abscess appears intimately apposed with the left sided adnexal structures. Otherwise, normal noncontrast appearance of the pelvic organs for age. No discrete right-sided adnexal lesions. No significant free fluid in the pelvic cul-de-sac. Other: Diffuse body wall anasarca likely progressed in the interval. Musculoskeletal: No acute or aggressive osseous abnormalities. Stigmata dish within the lower thoracic and upper lumbar spine. Mild-to-moderate multilevel lumbar spine DDD, worse at L5-S1 with disc space height loss, endplate irregularity and sclerosis. Mild degenerative change the bilateral hips with joint space loss, subchondral sclerosis and osteophytosis. IMPRESSION: 1. No change to slight increase in size of known presumed diverticular abscess involving the left hemipelvis and presumably involving the left adnexal structures. The amount of air within the collection has increased while the amount of fluid has reduced. If patient is symptoms are not improving with continued conservative management and there is a desire to attempt percutaneous aspiration and/or drainage catheter placement, would recommend further evaluation with IV contrast-enhanced CT scan of the abdomen and pelvis for procedural planning purposes (which may be performed immediately prior to attempted drainage catheter placement). 2. Mild left-sided pelviectasis and ureterectasis presumably secondary to the infectious/inflammatory process within the left hemipelvis. 3. Interval improvement in previously noted small bowel distension. Large colonic stool burden without evidence of enteric obstruction. 4. Coronary calcifications.  Aortic Atherosclerosis (ICD10-I70.0).  5. Cardiomegaly with findings of pulmonary edema with small to moderate sized bilateral pleural effusions and associated diffuse body wall anasarca, unchanged to slightly progressed since the 05/25 examination. Electronically Signed   By: Sandi Mariscal M.D.   On: 02/21/2021 11:06   DG CHEST PORT 1 VIEW  Result Date: 02/20/2021 CLINICAL DATA:  Wheezing. EXAM: PORTABLE CHEST 1 VIEW COMPARISON:  Feb 06, 2015 FINDINGS: Enlarged cardiac silhouette. Calcific atherosclerotic disease of the aorta and tortuosity. There is lower lobe predominant interstitial thickening and bilateral pleural effusions. No definite focal airspace consolidation, or pneumothorax. Osseous structures are without acute abnormality. Soft tissues are grossly normal. IMPRESSION: 1. Enlarged cardiac silhouette. 2. Lower lobe predominant interstitial thickening and bilateral pleural effusions. This likely represents interstitial pulmonary edema. Electronically Signed   By: Fidela Salisbury M.D.   On: 02/20/2021 11:48    Anti-infectives: Anti-infectives (From admission, onward)   Start     Dose/Rate Route Frequency Ordered Stop   02/17/21 2315  piperacillin-tazobactam (ZOSYN) IVPB 3.375 g        3.375 g 12.5 mL/hr over 240 Minutes Intravenous Every 8 hours 02/17/21 2224     02/15/21 1500  piperacillin-tazobactam (ZOSYN) IVPB 3.375 g  Status:  Discontinued        3.375 g 12.5 mL/hr over 240 Minutes Intravenous Every 8 hours 02/15/21 1412 02/17/21 2224   02/15/21 1230  ceFEPIme (MAXIPIME) 2 g in sodium chloride 0.9 % 100 mL IVPB  Status:  Discontinued        2 g 200 mL/hr over 30 Minutes Intravenous  Once 02/15/21 1221 02/15/21 1223   02/15/21 1230  metroNIDAZOLE (FLAGYL) IVPB 500 mg        500 mg 100 mL/hr over 60 Minutes Intravenous  Once 02/15/21 1221 02/15/21 1409   02/15/21 1230  ceFEPIme (MAXIPIME) 2 g in sodium chloride 0.9 % 100 mL IVPB  Status:  Discontinued        2  g 200 mL/hr over 30 Minutes Intravenous Every 12 hours  02/15/21 1223 02/15/21 1412      Assessment/Plan: HTN A. Fib on eliquis- on hold Postural dizziness Glaucoma  Hyponatremia/hypokalemia - per medicine for replacement Possible UTI - UA with +leukocytes and many bacteria, cx negative Hyperbilirubinemia - unclear etiology.  Other LFTs unremarkable.  Hepatitis panel is normal.  Perforated sigmoid diverticulitis with abscesses - abscess noted on scan and IR will attempt to aspirate vs drain it today -WBC improving today -cont abx therapy and hopefully drain placement will allow patient to improve -agree with family meeting tomorrow after drain placement today to determine the "what ifs" if the drain doesn't help.   FEN: NPO for procedure, may eat to follow, IVF VTE: SCDs, hold eliquis ID: [email or web address] 5/23; Zosyn 5/23>>  Henreitta Cea 02/22/2021

## 2021-02-22 NOTE — Progress Notes (Signed)
Subjective: HD 7 Overnight, no acute events reported.   This morning, [name] was evaluated at bedside. She is resting comfortably in bed, reports that she is "feeling fine." She was able to sleep for a few hours overnight and have food for dinner. Also reports having a bowel movement yesterday. Discussed procedure this morning to drain abscess and palliative care discussion with family following. She expresses understanding.   Objective:  Vital signs in last 24 hours: Vitals:   02/21/21 1931 02/21/21 2317 02/22/21 0316 02/22/21 0547  BP: (!) 127/56 (!) 104/50 (!) 152/46   Pulse: 95 95 75   Resp: 20 20 20    Temp: 97.6 F (36.4 C) 97.8 F (36.6 C) 97.8 F (36.6 C)   TempSrc: Oral Oral Oral   SpO2: 94% 95% 95%   Weight:    67.8 kg  Height:       CBC Latest Ref Rng & Units 02/22/2021 02/21/2021 02/20/2021  WBC 4.0 - 10.5 K/uL 24.0(H) 27.7(H) 30.2(H)  Hemoglobin 12.0 - 15.0 g/dL 9.6(L) 10.4(L) 10.7(L)  Hematocrit 36.0 - 46.0 % 27.8(L) 29.7(L) 29.9(L)  Platelets 150 - 400 K/uL 338 360 356   BMP Latest Ref Rng & Units 02/22/2021 02/21/2021 02/20/2021  Glucose 70 - 99 mg/dL 131(H) 127(H) 108(H)  BUN 8 - 23 mg/dL 6(L) <5(L) <5(L)  Creatinine 0.44 - 1.00 mg/dL 0.58 0.49 0.57  Sodium 135 - 145 mmol/L 130(L) 130(L) 131(L)  Potassium 3.5 - 5.1 mmol/L 3.7 4.1 3.9  Chloride 98 - 111 mmol/L 101 102 104  CO2 22 - 32 mmol/L 24 23 21(L)  Calcium 8.9 - 10.3 mg/dL 7.9(L) 7.9(L) 7.9(L)    Physical Exam  Constitutional: elderly frail appearing female resting comfortably in bed, no acute distress  HENT: Normocephalic and atraumatic, EOMI, moist mucous membranes Cardiovascular: regular rate, irregularly irregular S1 and S2 present, no murmurs, rubs, gallops.  Distal pulses intact Respiratory: Mild tachypnea with diminished breath sounds at bilateral bases; no wheezing or rales or rhonchi appreciated; on room air  GI: Nondistended, soft, nontender to palpation, normoactive bowel  sounds Musculoskeletal: Normal bulk and tone.  Slightly increased bilateral lower extremity nonpitting edema  Neurological: Is alert and oriented x4, no apparent focal deficits noted. Skin: Warm and dry.  No rash, erythema, lesions noted. Psychiatric: Normal mood and affect. Behavior is normal. Judgment and thought content normal.   Assessment/Plan:  Principal Problem:   Sepsis ([place]) Active Problems:   Urinary retention   Diverticular disease of intestine with perforation and abscess   Hyponatremia   Aortic atherosclerosis (HCC)   Wheezing  [name] with PMHx of atrial fibrillation on Eliquis, hypertension and glaucoma admitted for severe sepsis secondary to perforated colonic diverticulitis with multiple abscesses and hyponatremia, improving.   Perforated colonic diverticulitis with multiple abscesses Clinically, patient appears to be doing better. She was able to tolerate dinner last night and is having bowel movements. She denies any abdominal pain. Repeat CT Abdomen/Pelvis with unchanged/slightly increased size of diverticular abscess for which IR reconsulted. Plan for attempted drainage vs aspiration this morning. She is continued on IV Zosyn with white count improving.  - Appreciate surgery, IR and palliative care assistance and recommendations - Drainage vs aspiration with IR this AM - Palliative care discussions with family to follow  - Continue IV Zosyn daily - Blood cultures NGTD - CBC daily  Hypotonic hyponatremia, improving Initial presentation consistent with hypovolemia in setting of decreased oral intake. She was  has received ~5.2L of fluids during this admission. IV fluids discontinued yesterday. Na stable at 130 this morning. She has persistent bilateral nonpitting lower extremity edema with diminished lung sounds at bilateral lung bases. Noted to be ~15kg up from weight on presentation although not sure how accurate this is. She did  have 3.3L UOP over past 24 hours.  - Continue to hold IV fluids at this time  - If becomes dyspneic, can start diuretic  - Encourage PO intake following procedure today - Daily BMP  - Strict I&O and daily weights   Atrial fibrillation Noted to have intermittent episodes of tachycardia yesterday for which metoprolol resumed with improvement of HR to 60-70's overnight.  - Continue metoprolol 12.5mg  bid  - Continue cardiac monitoring   Hx of emphysema No wheezing appreciated on lung exam today. Breathing comfortably and saturating well on room air.  - Duonebs tid prn  Code status: DNR Fluids: None  Diet: Soft DVT Prophylaxis: SCDs  Prior to Admission Living Arrangement: Home  Anticipated Discharge Location: Home w/HH  Barriers to Discharge: Continued medical management  Dispo: Anticipated discharge pending clinical improvement.   Harvie Heck, MD  [facility] PGY-2 02/22/2021, 6:53 AM Pager: [phone] After 5pm on weekdays and 1pm on weekends: On Call pager [phone]

## 2021-02-22 NOTE — Plan of Care (Signed)
  Problem: Education: Goal: Knowledge of General Education information will improve Description Including pain rating scale, medication(s)/side effects and non-pharmacologic comfort measures Outcome: Progressing   

## 2021-02-23 DIAGNOSIS — K5720 Diverticulitis of large intestine with perforation and abscess without bleeding: Secondary | ICD-10-CM

## 2021-02-23 DIAGNOSIS — Z515 Encounter for palliative care: Secondary | ICD-10-CM | POA: Diagnosis not present

## 2021-02-23 DIAGNOSIS — R531 Weakness: Secondary | ICD-10-CM | POA: Diagnosis not present

## 2021-02-23 LAB — COMPREHENSIVE METABOLIC PANEL
ALT: 26 U/L (ref 0–44)
AST: 38 U/L (ref 15–41)
Albumin: 2.1 g/dL — ABNORMAL LOW (ref 3.5–5.0)
Alkaline Phosphatase: 151 U/L — ABNORMAL HIGH (ref 38–126)
Anion gap: 7 (ref 5–15)
BUN: <5 mg/dL — ABNORMAL LOW (ref 8–23)
CO2: 25 mmol/L (ref 22–32)
Calcium: 8.3 mg/dL — ABNORMAL LOW (ref 8.9–10.3)
Chloride: 98 mmol/L (ref 98–111)
Creatinine, Ser: 0.64 mg/dL (ref 0.44–1.00)
GFR, Estimated: >60 mL/min (ref >60)
Glucose, Bld: 93 mg/dL (ref 70–99)
Potassium: 4.2 mmol/L (ref 3.5–5.1)
Sodium: 130 mmol/L — ABNORMAL LOW (ref 135–145)
Total Bilirubin: 1.1 mg/dL (ref 0.3–1.2)
Total Protein: 5.5 g/dL — ABNORMAL LOW (ref 6.5–8.1)

## 2021-02-23 LAB — CBC
HCT: 32 % — ABNORMAL LOW (ref 36.0–46.0)
Hemoglobin: 11.1 g/dL — ABNORMAL LOW (ref 12.0–15.0)
MCH: 39.2 pg — ABNORMAL HIGH (ref 26.0–34.0)
MCHC: 34.7 g/dL (ref 30.0–36.0)
MCV: 113.1 fL — ABNORMAL HIGH (ref 80.0–100.0)
Platelets: 376 10*3/uL (ref 150–400)
RBC: 2.83 MIL/uL — ABNORMAL LOW (ref 3.87–5.11)
RDW: 14.4 % (ref 11.5–15.5)
WBC: 24.7 10*3/uL — ABNORMAL HIGH (ref 4.0–10.5)
nRBC: 0.3 % — ABNORMAL HIGH (ref 0.0–0.2)

## 2021-02-23 MED ORDER — AMOXICILLIN-POT CLAVULANATE 875-125 MG PO TABS
1.0000 | ORAL_TABLET | Freq: Two times a day (BID) | ORAL | Status: DC
  Administered 2021-02-23 – 2021-02-25 (×5): 1 via ORAL
  Filled 2021-02-23 (×5): qty 1

## 2021-02-23 NOTE — TOC Progression Note (Signed)
Transition of Care [facility]) - Progression Note    Patient Details  Name: Daisy Williams MRN: [id number] Date of Birth: 12/21/1927  Transition of Care [facility]) CM/SW [place], RN Phone Number: [phone]  02/23/2021, 2:33 PM  Clinical Narrative:    Patient has meet with palliative and decided that she would like to go home with hospice involvement choice is  [facility]. CM spoke with Farrel Gordon to make her aware of the need for hospice to follow patient at discharge. No other needs noted at this time. TOC will continue to follow.   Expected Discharge Plan: [place] Barriers to Discharge: Continued Medical Work up  Expected Discharge Plan and Services Expected Discharge Plan: [place] In-house Referral: NA Discharge Planning Services: CM Consult Post Acute Care Choice: [place] arrangements for the past 2 months: Single Family Home                 DME Arranged: Shower stool (tub/ shower seat) DME Agency: [employer] Date DME Agency Contacted: 02/16/21 Time DME Agency Contacted: 52   [place] Arranged: PT [place] Agency: Other - See comment Jackquline [place]) Date HH Agency Contacted: 02/16/21 Time [place]: 1345 Representative spoke with at [place]: [place] ([place]) Interventions    Readmission Risk Interventions No flowsheet data found.

## 2021-02-23 NOTE — Progress Notes (Signed)
Daily Progress Note   Patient Name: Daisy Williams       Date: 02/23/2021 DOB: Nov 30, 1927  Age: 87 y.o. MRN#: [id number] Attending Physician: Velna Ochs, MD Primary Care Physician: Leonard Downing, MD Admit Date: 02/15/2021  Reason for Consultation/Follow-up: Disposition and Establishing goals of care  Subjective: Patient feeling well today. No fevers, no pain. Feels like she's getting stronger day by day. She is eating and knows she needs to try and improve her nutrition to support healing and strength. They acknowledge that IR was unable to place a percutaneous drain (no safe window).  Had a family care meeting with the patient, Jeneen Rinks (husband), Silvestre Moment (daughter), Audelia Acton (grandson), Thomasena Edis (great grandson), and Evelena Peat, [place] (surgical). Discussed options moving forward (without surgery or abscess drain placement) and what each option would entail including surgery, d/c to home, home hospice, residential hospice.  The patient and family were given room to express thoughts and feelings as well as ask questions.  The patient decided to not proceed with surgery. Agreed with the surgical PA's tentative plan to transition IV antibiotics to oral antibiotics, observe for stability x 24-48 hours, possibly re-scan (CT) abdomen for abscess changes in 48 hours. Ultimate plan is to d/c to home with home hospice and family support, comfort, and dignity. If she deteriorates at home, could be candidate for residential hospice.  Length of Stay: 8  Current Medications: Scheduled Meds:  . brimonidine  1 drop Both Eyes TID  . dorzolamide-timolol  1 drop Both Eyes BID  . feeding supplement  237 mL Oral TID BM  . latanoprost  1 drop Both Eyes QHS  . metoprolol tartrate  12.5 mg Oral BID  .  multivitamin with minerals  1 tablet Oral Daily  . sodium chloride flush  3 mL Intravenous Q12H    Continuous Infusions: . piperacillin-tazobactam (ZOSYN)  IV 3.375 g (02/23/21 0519)    PRN Meds: acetaminophen **OR** acetaminophen, ipratropium-albuterol, ondansetron **OR** ondansetron (ZOFRAN) IV  Physical Exam Constitutional:      General: She is not in acute distress.    Appearance: She is not toxic-appearing.  Skin:    General: Skin is warm and dry.  Neurological:     General: No focal deficit present.     Mental Status: She is alert.  Psychiatric:  Mood and Affect: Mood normal.        Behavior: Behavior normal.        Thought Content: Thought content normal.             Vital Signs: BP (!) 149/38 (BP Location: Left Arm)   Pulse 72   Temp 98.1 F (36.7 C) (Axillary)   Resp 14   Ht 5\' 3"  (1.6 m)   Wt 65.4 kg   SpO2 94%   BMI 25.54 kg/m  SpO2: SpO2: 94 % O2 Device: O2 Device: Room Air O2 Flow Rate: O2 Flow Rate (L/min): 2 L/min  Intake/output summary:   Intake/Output Summary (Last 24 hours) at 02/23/2021 0810 Last data filed at 02/23/2021 0418 Gross per 24 hour  Intake --  Output 1800 ml  Net -1800 ml   LBM: Last BM Date: 02/22/21 Baseline Weight: Weight: 51.7 kg Most recent weight: Weight: 65.4 kg       Palliative Assessment/Data: 50%    Flowsheet Rows   Flowsheet Row Most Recent Value  Intake Tab   Referral Department Surgery  Unit at Time of Referral Intermediate Care Unit  Palliative Care Primary Diagnosis Sepsis/Infectious Disease  Date Notified 02/17/21  Palliative Care Type New Palliative care  Reason for referral Clarify Goals of Care  Date of Admission 02/15/21  Date first seen by Palliative Care 02/17/21  # of days Palliative referral response time 0 Day(s)  # of days IP prior to Palliative referral 2  Clinical Assessment   Psychosocial & Spiritual Assessment   Palliative Care Outcomes       Patient Active Problem List    Diagnosis Date Noted  . Wheezing   . Urinary retention 02/17/2021  . Colonic diverticular abscess 02/17/2021  . Hyponatremia 02/17/2021  . Aortic atherosclerosis ([place]) 02/17/2021  . Sepsis ([place]) 02/15/2021  . Persistent atrial fibrillation ([place]) 09/27/2017  . RBBB 09/27/2017  . Essential hypertension 12/13/2016  . Current use of long term anticoagulation 12/13/2016  . Postural dizziness 02/06/2015  . Atrial fibrillation with controlled ventricular response ([place]) 02/06/2015  . Dehydration 02/06/2015  . Macrocytosis 02/06/2015  . Acute hyperglycemia 02/06/2015  . Prolonged QT interval 02/06/2015  . Glaucoma 02/06/2015  . Elevated blood pressure 02/06/2015  . Vomiting 02/06/2015  . Dizziness 02/06/2015  . Microscopic hematuria 02/06/2015    Palliative Care Assessment & Plan   Patient Profile: 87 y.o.femalewith past medical history of Afib on Eliquis, hypertension, postural dizziness, and glaucomaadmitted on 5/23/2022with several days of abdominal pain with nausea and anorexia.Patient diagnosed with sepsis secondary to perforated sigmoid diverticulitis with abscesses. Patient on IV antibiotics. IR unable to safely drain abscesses despite evaluation for safe window x 3. WBC count stable but remains elevated, but abd pain improved and no fevers. Repeat CT with unchanged/slightly larger abscess despite antibiotics. Patient has previously stated she would like to avoid surgery, but seems to not be sure on first contact this morning. PMT following for further goals of care conversations.  Assessment: Patient comfortable, no pain or fevers. Multiple family members present. She puts faith in God and wants to be with her family's support.  Recommendations/Plan:  Transition antibiotics to oral  Monitor for stability on oral antibiotics (24-48 hours)  Consider possibly re-scan per surgery discussion  Discharge to home with home hospice when medical stable for discharge (prefer  [facility])  Goals of Care and Additional Recommendations:  Limitations on Scope of Treatment: Full Comfort Care and oral antibiotics at home  Code Status:  Code Status Orders  (From admission, onward)         Start     Ordered   02/15/21 1457  Do not attempt resuscitation (DNR)  Continuous       Question Answer Comment  In the event of cardiac or respiratory ARREST Do not call a "code blue"   In the event of cardiac or respiratory ARREST Do not perform Intubation, CPR, defibrillation or ACLS   In the event of cardiac or respiratory ARREST Use medication by any route, position, wound care, and other measures to relive pain and suffering. May use oxygen, suction and manual treatment of airway obstruction as needed for comfort.      02/15/21 1505        Code Status History    Date Active Date Inactive Code Status Order ID Comments User Context   02/06/2015 1822 02/08/2015 1742 Full Code [id number]  Samella Parr, NP Inpatient   Advance Care Planning Activity    Advance Directive Documentation   Flowsheet Row Most Recent Value  Type of Advance Directive Living will  Pre-existing out of facility DNR order (yellow form or pink MOST form) --  "MOST" Form in Place? --       Prognosis:   < 6 months  Discharge Planning:  Home with Hospice  Care plan was discussed with surgery, attending/medicine team, RN.  Thank you for allowing the Palliative Medicine Team to assist in the care of this patient.   Time In: 10:30 am Time Out: 12:00 pm Total Time 90 minutes Prolonged Time Billed  no       Greater than 50%  of this time was spent counseling and coordinating care related to the above assessment and plan.  Walden Field, NP  This NP was present for above family meeting and agree with above assessment and plan   Wadie Lessen, NP  Please contact Palliative Medicine Team phone at [phone] for questions and concerns.

## 2021-02-23 NOTE — Plan of Care (Signed)

## 2021-02-23 NOTE — Plan of Care (Signed)

## 2021-02-23 NOTE — Progress Notes (Signed)
Manufacturing engineer [facility]) Hospital Liaison: RN note    Notified by Transition of Care Manger of patient/family request for [facility] services at home after discharge. Chart and patient information under review by [facility] physician. Hospice eligibility pending currently.    Writer spoke with  daughter, Meredith Mody to initiate education related to hospice philosophy, services and team approach to care. Gwen verbalized understanding of information given. Per discussion, plan is for discharge to home by private car.   Please send signed and completed DNR form home with patient/family. Patient will need prescriptions for discharge comfort medications.     DME needs have been discussed, patient currently has the following equipment in the home: 3N1.  Patient/family requests the following DME for delivery to the home: none.     [facility] aware of the above. Please notify [facility] when patient is ready to leave the unit at discharge. (Call [phone] or [phone] after 5pm.) [facility] information and contact numbers given to [facility].      A Please do not hesitate to call with questions.    Thank you,   Farrel Gordon, RN, [place] (listed on [facility] under [place])    [phone]

## 2021-02-23 NOTE — Progress Notes (Signed)
Subjective: No acute events overnight.   Ms. Binegar states she is feeling well this morning without any pain. She says she has been able to eat some without trouble. She understands that IR was unable to drain abscesses yesterday. She says she does not want "any major surgeries" although is unable to draw the line as to when she'd like to stop treatment. She says she was told she'd have to live the rest of her life in a nursing home if she were to have surgery. Family meeting with palliative care is scheduled for 11AM today.   Objective:  Vital signs in last 24 hours: Vitals:   02/22/21 2056 02/23/21 0416 02/23/21 0500 02/23/21 0809  BP: (!) 136/41 (!) 149/38  (!) 180/64  Pulse: 68 72  (!) 59  Resp: 15 14  20   Temp: 98.5 F (36.9 C) 98.1 F (36.7 C)  98.5 F (36.9 C)  TempSrc: Oral Axillary  Oral  SpO2: 97% 94%  94%  Weight:   65.4 kg   Height:       Weight change: -2.4 kg  Intake/Output Summary (Last 24 hours) at 02/23/2021 1050 Last data filed at 02/23/2021 0418 Gross per 24 hour  Intake --  Output 1000 ml  Net -1000 ml   Labs: CBC Latest Ref Rng & Units 02/23/2021 02/22/2021 02/21/2021  WBC 4.0 - 10.5 K/uL 24.7(H) 24.0(H) 27.7(H)  Hemoglobin 12.0 - 15.0 g/dL 11.1(L) 9.6(L) 10.4(L)  Hematocrit 36.0 - 46.0 % 32.0(L) 27.8(L) 29.7(L)  Platelets 150 - 400 K/uL 376 338 360   CMP Latest Ref Rng & Units 02/23/2021 02/22/2021 02/21/2021  Glucose 70 - 99 mg/dL 93 131(H) 127(H)  BUN 8 - 23 mg/dL <5(L) 6(L) <5(L)  Creatinine 0.44 - 1.00 mg/dL 0.64 0.58 0.49  Sodium 135 - 145 mmol/L 130(L) 130(L) 130(L)  Potassium 3.5 - 5.1 mmol/L 4.2 3.7 4.1  Chloride 98 - 111 mmol/L 98 101 102  CO2 22 - 32 mmol/L 25 24 23   Calcium 8.9 - 10.3 mg/dL 8.3(L) 7.9(L) 7.9(L)  Total Protein 6.5 - 8.1 g/dL 5.5(L) 4.6(L) -  Total Bilirubin 0.3 - 1.2 mg/dL 1.1 0.7 -  Alkaline Phos 38 - 126 U/L 151(H) 141(H) -  AST 15 - 41 U/L 38 28 -  ALT 0 - 44 U/L 26 24 -   Physical Exam:  General: Frail, older  woman resting comfortably in bed in no acute distress.  HENT: Normocephalic, atraumatic. Moist mucus membranes. Hearing somewhat impaired CV: Normal rate, regularly irregular rhythm. S1/S2 present, no murmurs, rubs, or gallops appreciated. BLE nonpitting edema unchanged. Pulm: Lungs CTAB, no wheezes or crackles. No increased work of breathing. Breathing comfortably on room air Abd: Nontender, nondistended. No guarding or rebound. Normoactive bowel sounds.  Derm: Dry, wrinkled, intact. Ecchymosis L forearm, no lesions on visible skin.  Neuro: Alert and grossly oriented. Speech and cognition grossly intact. No grossly apparent focal neurologic deficits. Moves all extremities spontaneously Psych: Appropriate affect. Thought process is linear, logical, and goal-directed  Assessment/Plan:  Principal Problem:   Sepsis ([place]) Active Problems:   Urinary retention   Colonic diverticular abscess   Hyponatremia   Aortic atherosclerosis (HCC)   Wheezing  Mrs. Geffrard is a 87 year old lady with a medical history of Afib on Eliquis, glaucoma, and hypertension admitted for severe sepsis secondary to perforated colonic diverticulitis with multiple abscesses and hyponatremia.   #Perforated colonic diverticulitis with multiple abscesses She is not having any abdominal pain and tolerating po diet without difficulty. Her white  count is about the same today at 24.7, but suspect that infection will not resolve with antibiotics alone. IR was unfortunately not able to access the abscess yesterday for drain vs aspiration. She has a family meeting planned with palliative care today to discuss next steps. If she ultimately decides against surgery, from a medical standpoint she is likely as stable as she will become in the hospital and can potentially be discharged home with treatment dependent upon goals of care conversation with palliative care.  - Appreciate surgery, IR, and palliative care recommendations  - IR  unable to drain abscess - Family meeting today at [place] for now - Blood cultures show no growth to date - Continue to trend CBC  #Hypotonic hyponatremia, improving Hypovolemic at presentation secondary to poor po intake. She received total ~5.2L IVF this admission. Fluids were held due to concern for hypervolemia. She is now tolerating po diet well and Na stable today at 130. No change in her BLE nonpitting edema on physical exam. Lungs were clear today although did not auscultate bases. - Continue to hold IV fluids and encourage po intake - Daily BMP - Continue strict I/Os and daily weights  #Afib HR has been stable in 60s-90s with no further episodes of intermittent tachycardia since home metoprolol was resumed on 5/29. - Continue cardiac monitoring - Continue metoprolol 12.5mg  bid - Continue to hold Eliquis for now. Restarting this will depend on goals of care conversation with palliative care    Code Status: DNR Fluids: none Diet: soft  DVT Prophylaxis: SCDs   LOS: 8 days   Theodosia Blender, Medical Student 02/23/2021, 10:50 AM

## 2021-02-23 NOTE — Progress Notes (Signed)
Subjective/Chief Complaint: IR unable to get drain in as no good window.  Patient still denies abdominal pain today and is eating.  Objective: Vital signs in last 24 hours: Temp:  [98.1 F (36.7 C)-98.5 F (36.9 C)] 98.5 F (36.9 C) (05/31 0809) Pulse Rate:  [59-96] 59 (05/31 0809) Resp:  [14-23] 20 (05/31 0809) BP: (136-191)/(35-75) 180/64 (05/31 0809) SpO2:  [92 %-98 %] 94 % (05/31 0809) Weight:  [65.4 kg] 65.4 kg (05/31 0500) Last BM Date: 02/22/21  Intake/Output from previous day: 05/30 0701 - 05/31 0700 In: -  Out: 1800 [Urine:1800] Intake/Output this shift: No intake/output data recorded.  PE: Gen: NAD, laying in bed Abd: soft, NT, ND, +BS  Lab Results:  Recent Labs    02/22/21 0048 02/23/21 0228  WBC 24.0* 24.7*  HGB 9.6* 11.1*  HCT 27.8* 32.0*  PLT 338 376   BMET Recent Labs    02/22/21 0048 02/23/21 0228  NA 130* 130*  K 3.7 4.2  CL 101 98  CO2 24 25  GLUCOSE 131* 93  BUN 6* <5*  CREATININE 0.58 0.64  CALCIUM 7.9* 8.3*   PT/INR Recent Labs    02/22/21 0048  LABPROT 14.3  INR 1.1   ABG No results for input(s): PHART, HCO3 in the last 72 hours.  Invalid input(s): PCO2, PO2  Studies/Results: CT ABDOMEN PELVIS WO CONTRAST  Result Date: 02/21/2021 CLINICAL DATA:  Concern for diverticulitis. EXAM: CT ABDOMEN AND PELVIS WITHOUT CONTRAST TECHNIQUE: Multidetector CT imaging of the abdomen and pelvis was performed following the standard protocol without IV contrast. COMPARISON:  02/17/2021; 02/15/2021 FINDINGS: The lack of intravenous contrast limits the ability to evaluate solid abdominal organs. Lower chest: Limited visualization of the lower thorax demonstrates small to moderate-sized bilateral effusions with associated bibasilar consolidative opacities, similar to the 02/17/2021 examination. Cardiomegaly. Coronary artery calcifications. No pericardial effusion. Hepatobiliary: Normal hepatic contour. Normal noncontrast appearance of the  gallbladder given underdistention. No radiopaque gallstones. No ascites. Pancreas: Normal noncontrast appearance of the pancreas. Spleen: Normal noncontrast appearance of the spleen. Adrenals/Urinary Tract: Normal noncontrast appearance the bilateral kidneys. Calcifications at the level of the right renal hila are favored to be vascular in etiology. No definite renal stones. No renal stones are seen along expected course of either ureter or the urinary bladder. Normal noncontrast appearance of the urinary bladder given degree of distention. Note is made of mild left-sided pelviectasis and mild ureterectasis involving the superior aspect of the left ureter presumably secondary to the inflammatory process within the left hemipelvis. No evidence of right-sided urinary obstruction. No perinephric stranding. Normal noncontrast appearance of the bilateral adrenal glands. Stomach/Bowel: The overall size of the known presumed diverticular abscess involving the sigmoid colon and left hemipelvis is unchanged to slightly increased compared to the 02/17/2021 examination measuring at least 5.6 x 2.6 x 9.4 cm (axial image 68, series 3; sagittal image 76, series 7), though direct measurements are difficult secondary to the irregular shape of the collection on this noncontrast examination, previously, 2.9 x 1.8 x 9.1 cm. There now appears to be less fluid and more air within the collection. The collection again appears intimately apposed with the left-sided adnexal structures. No definitive new definable/drainable fluid collection on this noncontrast examination. Ingested enteric contrast extends to the level of the mid small bowel. There is a large stool burden within the rectal vault and distal sigmoid colon. Previously noted small bowel distension has improved in the interval. No evidence of enteric obstruction. Normal noncontrast appearance of the  terminal ileum and retrocecal appendix. No pneumoperitoneum, pneumatosis or  portal venous gas. Vascular/Lymphatic: Large amount of calcified atherosclerotic plaque within the abdominal aorta and pelvic vasculature. No bulky retroperitoneal, mesenteric, pelvic or inguinal lymphadenopathy on this noncontrast examination. Reproductive: As above, the perforated diverticular abscess appears intimately apposed with the left sided adnexal structures. Otherwise, normal noncontrast appearance of the pelvic organs for age. No discrete right-sided adnexal lesions. No significant free fluid in the pelvic cul-de-sac. Other: Diffuse body wall anasarca likely progressed in the interval. Musculoskeletal: No acute or aggressive osseous abnormalities. Stigmata dish within the lower thoracic and upper lumbar spine. Mild-to-moderate multilevel lumbar spine DDD, worse at L5-S1 with disc space height loss, endplate irregularity and sclerosis. Mild degenerative change the bilateral hips with joint space loss, subchondral sclerosis and osteophytosis. IMPRESSION: 1. No change to slight increase in size of known presumed diverticular abscess involving the left hemipelvis and presumably involving the left adnexal structures. The amount of air within the collection has increased while the amount of fluid has reduced. If patient is symptoms are not improving with continued conservative management and there is a desire to attempt percutaneous aspiration and/or drainage catheter placement, would recommend further evaluation with IV contrast-enhanced CT scan of the abdomen and pelvis for procedural planning purposes (which may be performed immediately prior to attempted drainage catheter placement). 2. Mild left-sided pelviectasis and ureterectasis presumably secondary to the infectious/inflammatory process within the left hemipelvis. 3. Interval improvement in previously noted small bowel distension. Large colonic stool burden without evidence of enteric obstruction. 4. Coronary calcifications.  Aortic Atherosclerosis  (ICD10-I70.0). 5. Cardiomegaly with findings of pulmonary edema with small to moderate sized bilateral pleural effusions and associated diffuse body wall anasarca, unchanged to slightly progressed since the 05/25 examination. Electronically Signed   By: Sandi Mariscal M.D.   On: 02/21/2021 11:06   CT PELVIS W WO CONTRAST  Result Date: 02/22/2021 CLINICAL DATA:  87 year old female with colonic diverticular abscess presenting for attempted percutaneous drainage. EXAM: CT PELVIS WITH AND WITHOUT CONTRAST TECHNIQUE: Multidetector CT imaging of the pelvis was performed following the standard protocol before and following the bolus administration of intravenous contrast. CONTRAST:  27mL OMNIPAQUE IOHEXOL 300 MG/ML  SOLN COMPARISON:  02/21/2021 FINDINGS: Urinary Tract: The urinary bladder is mildly distended without wall thickening. The distal ureters are within normal limits. Bowel: Previously administered enteric contrast is within the bowel. Scattered sigmoid and descending diverticula. No significant distention of visualized small bowel. Vascular/Lymphatic: Atherosclerotic calcifications. No pelvic lymphadenopathy. Reproductive:  No mass or other significant abnormality Other: Similar appearing thick-walled, irregularly-shaped gas containing fluid collection about the inferolateral aspect of the sigmoid colon extending into just lateral to the left aspect of the uterus. Mild surrounding fat stranding. Musculoskeletal: No suspicious bone lesions identified. IMPRESSION: Similar appearing left pericolonic abscess, presumed diverticular in etiology. After scanning in multiple positions, there is no safe window for percutaneous drainage. Ruthann Cancer, MD [facility] Electronically Signed   By: Ruthann Cancer MD   On: 02/22/2021 14:40    Anti-infectives: Anti-infectives (From admission, onward)   Start     Dose/Rate Route Frequency Ordered Stop   02/17/21 2315   piperacillin-tazobactam (ZOSYN) IVPB 3.375 g        3.375 g 12.5 mL/hr over 240 Minutes Intravenous Every 8 hours 02/17/21 2224     02/15/21 1500  piperacillin-tazobactam (ZOSYN) IVPB 3.375 g  Status:  Discontinued        3.375 g 12.5 mL/hr over 240  Minutes Intravenous Every 8 hours 02/15/21 1412 02/17/21 2224   02/15/21 1230  ceFEPIme (MAXIPIME) 2 g in sodium chloride 0.9 % 100 mL IVPB  Status:  Discontinued        2 g 200 mL/hr over 30 Minutes Intravenous  Once 02/15/21 1221 02/15/21 1223   02/15/21 1230  metroNIDAZOLE (FLAGYL) IVPB 500 mg        500 mg 100 mL/hr over 60 Minutes Intravenous  Once 02/15/21 1221 02/15/21 1409   02/15/21 1230  ceFEPIme (MAXIPIME) 2 g in sodium chloride 0.9 % 100 mL IVPB  Status:  Discontinued        2 g 200 mL/hr over 30 Minutes Intravenous Every 12 hours 02/15/21 1223 02/15/21 1412      Assessment/Plan: HTN A. Fib on eliquis- on hold Postural dizziness Glaucoma  Hyponatremia/hypokalemia - per medicine for replacement Possible UTI - UA with +leukocytes and many bacteria, cx negative Hyperbilirubinemia - unclear etiology.  Other LFTs unremarkable.  Hepatitis panel is normal.  Perforated sigmoid diverticulitis with abscesses - abscess noted on scan but unfortunately IR unable to get access for a drain yesterday -at this point, only option moving forward is to continue abx therapy.  Given the patient is AF, no pain, and tolerating a soft diet, at this point she can likely be transitioned to oral abx with the expectation of trying to get her home soon.  I realize her WBC is still elevated, but not sure IV abx are going to penetrate this abscess any more so than oral at this point as she has been on IV for over a week now with minimal to no significant change.  It is unclear whether IV or oral abx alone are going to get her better -once again patient does not want any surgery, so not much else to offer her at this point. -family meeting today  scheduled.   FEN: soft diet VTE: SCDs, can likely resume eliquis at this point, unless transition made towards a comfort approach then not sure there is a need to restart. ID: [email or web address] 5/23; Zosyn 5/23>>  Henreitta Cea 02/23/2021

## 2021-02-24 LAB — CBC WITH DIFFERENTIAL/PLATELET
Abs Immature Granulocytes: 3.76 10*3/uL — ABNORMAL HIGH (ref 0.00–0.07)
Basophils Absolute: 0.1 10*3/uL (ref 0.0–0.1)
Basophils Relative: 0 %
Eosinophils Absolute: 0.4 10*3/uL (ref 0.0–0.5)
Eosinophils Relative: 2 %
HCT: 33.5 % — ABNORMAL LOW (ref 36.0–46.0)
Hemoglobin: 11.5 g/dL — ABNORMAL LOW (ref 12.0–15.0)
Immature Granulocytes: 19 %
Lymphocytes Relative: 6 %
Lymphs Abs: 1.2 10*3/uL (ref 0.7–4.0)
MCH: 39 pg — ABNORMAL HIGH (ref 26.0–34.0)
MCHC: 34.3 g/dL (ref 30.0–36.0)
MCV: 113.6 fL — ABNORMAL HIGH (ref 80.0–100.0)
Monocytes Absolute: 1.4 10*3/uL — ABNORMAL HIGH (ref 0.1–1.0)
Monocytes Relative: 7 %
Neutro Abs: 13.4 10*3/uL — ABNORMAL HIGH (ref 1.7–7.7)
Neutrophils Relative %: 66 %
Platelets: 365 10*3/uL (ref 150–400)
RBC: 2.95 MIL/uL — ABNORMAL LOW (ref 3.87–5.11)
RDW: 14.6 % (ref 11.5–15.5)
WBC: 20.8 10*3/uL — ABNORMAL HIGH (ref 4.0–10.5)
nRBC: 0.2 % (ref 0.0–0.2)

## 2021-02-24 LAB — COMPREHENSIVE METABOLIC PANEL
ALT: 31 U/L (ref 0–44)
AST: 37 U/L (ref 15–41)
Albumin: 2.1 g/dL — ABNORMAL LOW (ref 3.5–5.0)
Alkaline Phosphatase: 132 U/L — ABNORMAL HIGH (ref 38–126)
Anion gap: 9 (ref 5–15)
BUN: <5 mg/dL — ABNORMAL LOW (ref 8–23)
CO2: 26 mmol/L (ref 22–32)
Calcium: 8.6 mg/dL — ABNORMAL LOW (ref 8.9–10.3)
Chloride: 97 mmol/L — ABNORMAL LOW (ref 98–111)
Creatinine, Ser: 0.66 mg/dL (ref 0.44–1.00)
GFR, Estimated: >60 mL/min (ref >60)
Glucose, Bld: 97 mg/dL (ref 70–99)
Potassium: 4 mmol/L (ref 3.5–5.1)
Sodium: 132 mmol/L — ABNORMAL LOW (ref 135–145)
Total Bilirubin: 0.8 mg/dL (ref 0.3–1.2)
Total Protein: 5.4 g/dL — ABNORMAL LOW (ref 6.5–8.1)

## 2021-02-24 NOTE — Progress Notes (Addendum)
Subjective: No acute events overnight.   Daisy Williams was seen resting comfortably in bed this morning. She tolerated solid food for breakfast without any difficulty. Her last BM was yesterday. She denies nausea and abdominal pain this morning. She does not have any questions following family meeting with palliative care yesterday, is looking forward to going home "when you think I'm ready."   Objective:  Vital signs in last 24 hours: Vitals:   02/24/21 0400 02/24/21 0500 02/24/21 0808 02/24/21 1100  BP:   (!) 157/86   Pulse:      Resp:      Temp: 98.2 F (36.8 C)  98 F (36.7 C) 98.2 F (36.8 C)  TempSrc: Oral  Oral Oral  SpO2:      Weight:  63.4 kg    Height:       Weight change: -2 kg  Intake/Output Summary (Last 24 hours) at 02/24/2021 1539 Last data filed at 02/24/2021 1110 Gross per 24 hour  Intake 798.25 ml  Output 3400 ml  Net -2601.75 ml   Labs:  CBC Latest Ref Rng & Units 02/24/2021 02/23/2021 02/22/2021  WBC 4.0 - 10.5 K/uL 20.8(H) 24.7(H) 24.0(H)  Hemoglobin 12.0 - 15.0 g/dL 11.5(L) 11.1(L) 9.6(L)  Hematocrit 36.0 - 46.0 % 33.5(L) 32.0(L) 27.8(L)  Platelets 150 - 400 K/uL 365 376 338   CMP Latest Ref Rng & Units 02/24/2021 02/23/2021 02/22/2021  Glucose 70 - 99 mg/dL 97 93 131(H)  BUN 8 - 23 mg/dL <5(L) <5(L) 6(L)  Creatinine 0.44 - 1.00 mg/dL 0.66 0.64 0.58  Sodium 135 - 145 mmol/L 132(L) 130(L) 130(L)  Potassium 3.5 - 5.1 mmol/L 4.0 4.2 3.7  Chloride 98 - 111 mmol/L 97(L) 98 101  CO2 22 - 32 mmol/L 26 25 24   Calcium 8.9 - 10.3 mg/dL 8.6(L) 8.3(L) 7.9(L)  Total Protein 6.5 - 8.1 g/dL 5.4(L) 5.5(L) 4.6(L)  Total Bilirubin 0.3 - 1.2 mg/dL 0.8 1.1 0.7  Alkaline Phos 38 - 126 U/L 132(H) 151(H) 141(H)  AST 15 - 41 U/L 37 38 28  ALT 0 - 44 U/L 31 26 24    Physical Exam:  General: Older lady resting in bed in no acute distress HENT: Normocephalic, atraumatic. Moist mucus membranes. Hearing slightly impaired CV: Irregularly irregular rhythm with normal rate.  S1/S2 present with no murmurs/rubs/gallops appreciated. Mild BLE nonpitting edema Pulm: Lungs CTAB. No wheezes or crackles. Breathing comfortably on room air Abd: Nontender, nondistended. No guarding or rebound. Normoactive bowel sounds Derm: Skin dry and wrinkled. No lesions or rashes on visible skin Neuro: Alert and grossly oriented. Speech and cognition grossly intact. Moves all extremities spontaneously Psych: Appropriate affect. Thought process is linear, logical, and goal-directed   Assessment/Plan:  Principal Problem:   Sepsis ([place]) Active Problems:   Urinary retention   Colonic diverticular abscess   Hyponatremia   Aortic atherosclerosis (HCC)   Wheezing  Daisy Williams is a 87 year old lady with a history of hypertension, glaucoma, and Afib on Eliquis who was admitted for hyponatremia and sepsis secondary to perforated colonic diverticulitis with multiple abscesses.   #Sepsis secondary to perforated colonic diverticulitis with abscesses She remains afebrile and her white count is trending down. Symptomatically, she is without complaint this morning and is tolerating po antibiotics and food without difficulty. She does not wish to have surgery and plans to discharge home with hospice. Based on location of her abscesses, IR has been unable to drain x3 and this will not likely change. As she seems to be  improving and does not want surgery, there is little utility in repeating CT abdomen/pelvis prior to discharge. Spoke with grandson, Audelia Acton, today and family is on board with possible discharge tomorrow. Duration of antibiotic therapy will depend on goals of care as infection is likely to resume/worsen once antibiotics are stopped, will discuss with patient tomorrow.  - Appreciate surgery, IR, and palliative care involvement  - Continue po augmentin - Blood cultures show no growth to date - Repeat CBC will not change management at this point  #Hypotonic hyponatremia, stable Na  stable at 132, suspect Na level will continue to improve with increased po intake.  - Continue to hold fluids for now - Continue to encourage po intake - No further BMP  Code Status: DNR Diet: Regular Fluids: None VTE ppx: SCDs   LOS: 9 days   Theodosia Blender, Medical Student 02/24/2021, 3:39 PM

## 2021-02-24 NOTE — Progress Notes (Addendum)
Subjective/Chief Complaint: Patient looks great this morning.  No complaints.  Eating some of her breakfast with no issues.  No pain  Objective: Vital signs in last 24 hours: Temp:  [97.6 F (36.4 C)-98.5 F (36.9 C)] 98 F (36.7 C) (06/01 0808) Pulse Rate:  [72-77] 72 (05/31 2000) Resp:  [15-16] 15 (05/31 1500) BP: (149-157)/(49-86) 157/86 (06/01 0808) SpO2:  [93 %-97 %] 93 % (05/31 1500) Weight:  [63.4 kg] 63.4 kg (06/01 0500) Last BM Date: 02/22/21  Intake/Output from previous day: 05/31 0701 - 06/01 0700 In: 798.3 [P.O.:600; IV Piggyback:198.3] Out: 3500 [Urine:3500] Intake/Output this shift: No intake/output data recorded.  PE: Gen: NAD, laying in bed Abd: soft, NT, ND, +BS  Lab Results:  Recent Labs    02/23/21 0228 02/24/21 0345  WBC 24.7* 20.8*  HGB 11.1* 11.5*  HCT 32.0* 33.5*  PLT 376 365   BMET Recent Labs    02/23/21 0228 02/24/21 0345  NA 130* 132*  K 4.2 4.0  CL 98 97*  CO2 25 26  GLUCOSE 93 97  BUN <5* <5*  CREATININE 0.64 0.66  CALCIUM 8.3* 8.6*   PT/INR Recent Labs    02/22/21 0048  LABPROT 14.3  INR 1.1   ABG No results for input(s): PHART, HCO3 in the last 72 hours.  Invalid input(s): PCO2, PO2  Studies/Results: CT PELVIS W WO CONTRAST  Result Date: 02/22/2021 CLINICAL DATA:  87 year old female with colonic diverticular abscess presenting for attempted percutaneous drainage. EXAM: CT PELVIS WITH AND WITHOUT CONTRAST TECHNIQUE: Multidetector CT imaging of the pelvis was performed following the standard protocol before and following the bolus administration of intravenous contrast. CONTRAST:  60mL OMNIPAQUE IOHEXOL 300 MG/ML  SOLN COMPARISON:  02/21/2021 FINDINGS: Urinary Tract: The urinary bladder is mildly distended without wall thickening. The distal ureters are within normal limits. Bowel: Previously administered enteric contrast is within the bowel. Scattered sigmoid and descending diverticula. No significant distention of  visualized small bowel. Vascular/Lymphatic: Atherosclerotic calcifications. No pelvic lymphadenopathy. Reproductive:  No mass or other significant abnormality Other: Similar appearing thick-walled, irregularly-shaped gas containing fluid collection about the inferolateral aspect of the sigmoid colon extending into just lateral to the left aspect of the uterus. Mild surrounding fat stranding. Musculoskeletal: No suspicious bone lesions identified. IMPRESSION: Similar appearing left pericolonic abscess, presumed diverticular in etiology. After scanning in multiple positions, there is no safe window for percutaneous drainage. Ruthann Cancer, MD [facility] Electronically Signed   By: Ruthann Cancer MD   On: 02/22/2021 14:40    Anti-infectives: Anti-infectives (From admission, onward)   Start     Dose/Rate Route Frequency Ordered Stop   02/23/21 1415  amoxicillin-clavulanate (AUGMENTIN) 875-125 MG per tablet 1 tablet        1 tablet Oral Every 12 hours 02/23/21 1318     02/17/21 2315  piperacillin-tazobactam (ZOSYN) IVPB 3.375 g  Status:  Discontinued        3.375 g 12.5 mL/hr over 240 Minutes Intravenous Every 8 hours 02/17/21 2224 02/23/21 1318   02/15/21 1500  piperacillin-tazobactam (ZOSYN) IVPB 3.375 g  Status:  Discontinued        3.375 g 12.5 mL/hr over 240 Minutes Intravenous Every 8 hours 02/15/21 1412 02/17/21 2224   02/15/21 1230  ceFEPIme (MAXIPIME) 2 g in sodium chloride 0.9 % 100 mL IVPB  Status:  Discontinued        2 g 200 mL/hr over 30 Minutes Intravenous  Once 02/15/21 1221 02/15/21 1223  02/15/21 1230  metroNIDAZOLE (FLAGYL) IVPB 500 mg        500 mg 100 mL/hr over 60 Minutes Intravenous  Once 02/15/21 1221 02/15/21 1409   02/15/21 1230  ceFEPIme (MAXIPIME) 2 g in sodium chloride 0.9 % 100 mL IVPB  Status:  Discontinued        2 g 200 mL/hr over 30 Minutes Intravenous Every 12 hours 02/15/21 1223 02/15/21 1412       Assessment/Plan: HTN A. Fib on eliquis- on hold Postural dizziness Glaucoma  Hyponatremia/hypokalemia - per medicine for replacement Possible UTI - UA with +leukocytes and many bacteria, cx negative Hyperbilirubinemia - unclear etiology.  Other LFTs unremarkable.  Hepatitis panel is normal.  Perforated sigmoid diverticulitis with abscesses - long family meeting yesterday which I was in attendance for.  Patient decided against wanting surgery and wanting to go home and if she improves great and if she doesn't then hospice can be available. -patient actually looks well today.  No pain -WBC down to 20K today -on oral augmentin. -likely another 7 days at discharge to total about 2 weeks worth of treatment -at this point as she appears improving, would not repeat a CT scan and treat her clinically. -surgically stable for DC home once all arrangements made for home. -given no desire to want surgery, will have her follow up with her PCP as needed as an outpatient.   FEN: soft diet VTE: SCDs, eliquis per primary ID: [email or web address] 5/23; Zosyn 5/23>>5/31, Augmentin 5/31 -->  Henreitta Cea 02/24/2021

## 2021-02-24 NOTE — Progress Notes (Signed)
Physical Therapy Treatment Patient Details Name: Daisy Williams MRN: [id number] DOB: 12/07/1927 Today's Date: 02/24/2021    History of Present Illness Pt is a 87 y.o. female admitted 02/15/21 with abdominal pain, nausea, decreased appetite. CT angio abdomen/pelvis showed bowel changes and abscesses likely secondary to perforated sigmoid colon diverticulitis. Workup for sepsis, diverticulitis, possible UTI. IR did not find safe window for percutaneous drainage. Plan for medical management. Pt adamantly refusing any surgery. PMH includes glaucoma, inner ear dysfunction, afib on Eliquis.    PT Comments    Pt admitted with above diagnosis. Pt was able to ambulate with RW with min guard assist and no LOB. Pt does well with RW.  Husband states he feels good with walking pt but is not comfortable with cleaning pt since she has been incontinent but he does mention his daughter will most likely be helping. Will follow.  Pt currently with functional limitations due to balance and endurance deficits. Pt will benefit from skilled PT to increase their independence and safety with mobility to allow discharge to the venue listed below.     Follow Up Recommendations  Home health PT;Supervision for mobility/OOB     Equipment Recommendations  Rolling walker with 5" wheels    Recommendations for Other Services       Precautions / Restrictions Precautions Precautions: Fall Precaution Comments: watch HR Restrictions Weight Bearing Restrictions: No    Mobility  Bed Mobility Overal bed mobility: Needs Assistance Bed Mobility: Supine to Sit     Supine to sit: Min assist;HOB elevated     General bed mobility comments: Needed a little assist to EOB for trunk elevation and moveing LEs    Transfers Overall transfer level: Needs assistance Equipment used: Rolling walker (2 wheeled) Transfers: Sit to/from Stand Sit to Stand: Supervision;Min guard         General transfer comment: Incr time to come  to stand. Noted BM on pad therefore pt stood to be cleaned with min guard assist.  Ambulation/Gait Ambulation/Gait assistance: Min guard Gait Distance (Feet): 220 Feet Assistive device: Rolling walker (2 wheeled) Gait Pattern/deviations: Step-through pattern;Decreased stride length;Trunk flexed Gait velocity: decr Gait velocity interpretation: 1.31 - 2.62 ft/sec, indicative of limited community ambulator General Gait Details: Assist for safety with pt being fairly steady with the RW.  No LOB with min challenges.  VSS.   Stairs             Wheelchair Mobility    Modified Rankin (Stroke Patients Only)       Balance Overall balance assessment: Needs assistance Sitting-balance support: No upper extremity supported;Feet supported Sitting balance-Leahy Scale: Good     Standing balance support: During functional activity;Bilateral upper extremity supported Standing balance-Leahy Scale: Fair Standing balance comment: relies on UE support dynamically but can take hands off RW for static stance.                            Cognition Arousal/Alertness: Awake/alert Behavior During Therapy: WFL for tasks assessed/performed Overall Cognitive Status: Within Functional Limits for tasks assessed                                        Exercises General Exercises - Lower Extremity Ankle Circles/Pumps: AROM;Both;10 reps;Supine Long Arc Quad: AROM;Both;10 reps;Seated Hip Flexion/Marching: AROM;Both;10 reps;Seated    General Comments General comments (skin integrity, edema, etc.): VSS on  RA. Husband present at bedside for family education session.      Pertinent Vitals/Pain Pain Assessment: No/denies pain    Home Living                      Prior Function            PT Goals (current goals can now be found in the care plan section) Acute Rehab PT Goals Patient Stated Goal: return home Progress towards PT goals: Progressing toward  goals    Frequency    Min 3X/week      PT Plan Current plan remains appropriate    Co-evaluation              AM-PAC PT "6 Clicks" Mobility   Outcome Measure  Help needed turning from your back to your side while in a flat bed without using bedrails?: None Help needed moving from lying on your back to sitting on the side of a flat bed without using bedrails?: A Little Help needed moving to and from a bed to a chair (including a wheelchair)?: A Little Help needed standing up from a chair using your arms (e.g., wheelchair or bedside chair)?: A Little Help needed to walk in hospital room?: A Little Help needed climbing 3-5 steps with a railing? : A Little 6 Click Score: 19    End of Session Equipment Utilized During Treatment: Gait belt Activity Tolerance: Patient tolerated treatment well Patient left: in chair;with call bell/phone within reach;with chair alarm set;with family/visitor present Nurse Communication: Mobility status PT Visit Diagnosis: Other abnormalities of gait and mobility (R26.89);Muscle weakness (generalized) (M62.81)     Time: 8250-0370 PT Time Calculation (min) (ACUTE ONLY): 23 min  Charges:  $Gait Training: 8-22 mins $Therapeutic Exercise: 8-22 mins                     Daisy Williams,PT Acute Rehab Services [phone] (pager)   Alvira Philips 02/24/2021, 1:22 PM

## 2021-02-24 NOTE — Progress Notes (Signed)
[facility]) [facility] Liaison Note  Met with patient and husband in room, she is approved for hospice services at home. Patient sitting up in chair and reports to feeling some better today. She reports she believes she will be discharged Thursday or Friday.   Please call with any Hospice related questions or concerns.   Jhonnie Garner, Therapist, sports, BSN, [facility] [phone]

## 2021-02-24 NOTE — Progress Notes (Signed)
Occupational Therapy Treatment Patient Details Name: Daisy Williams MRN: [id number] DOB: 1928-08-30 Today's Date: 02/24/2021    History of present illness Pt is a 87 y.o. female admitted 02/15/21 with abdominal pain, nausea, decreased appetite. CT angio abdomen/pelvis showed bowel changes and abscesses likely secondary to perforated sigmoid colon diverticulitis. Workup for sepsis, diverticulitis, possible UTI. IR did not find safe window for percutaneous drainage. Plan for medical management. Pt adamantly refusing any surgery. PMH includes glaucoma, inner ear dysfunction, afib on Eliquis.   OT comments  OT treatment session with focus on patient/family education. OT provided education on incontinence products including briefs to maintain skin integrity and increase safety/independence with self-care tasks. Husband Daisy Williams instructed to follow-up with insurance to assess eligibility vs purchasing out of pocket. Patient/family also instructed on use of gait belt for mobility and need for assist with LB ADLs and all ADL transfers initially upon return home for safety. Both patient and spouse expressed verbal understanding but would benefit from continued education. Patient making great progress toward goals. OT will continue to follow acutely.      Follow Up Recommendations  Home health OT;Supervision - Intermittent    Equipment Recommendations  3 in 1 bedside commode    Recommendations for Other Services      Precautions / Restrictions Precautions Precautions: Fall Restrictions Weight Bearing Restrictions: No       Mobility Bed Mobility               General bed mobility comments: Patient seated in recliner upon entry.    Transfers Overall transfer level: Needs assistance Equipment used: Rolling walker (2 wheeled) Transfers: Sit to/from Stand Sit to Stand: Supervision;Min guard         General transfer comment: Supervision A for sit to stand from recliner. Patient requires  increased time.    Balance Overall balance assessment: Needs assistance Sitting-balance support: No upper extremity supported;Feet supported Sitting balance-Leahy Scale: Good     Standing balance support: No upper extremity supported;During functional activity Standing balance-Leahy Scale: Fair Standing balance comment: Able to maintain static standing balance at sink surface with unilateral UE support on RW vs sink.                           ADL either performed or assessed with clinical judgement   ADL Overall ADL's : Needs assistance/impaired     Grooming: Wash/dry hands;Wash/dry face;Oral care;Standing;Supervision/safety Grooming Details (indicate cue type and reason): 2/3 grooming tasks standing at sink level with supervision A and at least unilateral UE support on sink surface. Patient able to tolerate standing for duration of task without need for rest break.             Lower Body Dressing: Minimal assistance;Sit to/from stand Lower Body Dressing Details (indicate cue type and reason): Min A to don brief in sitting/standing. Requries increased time. Education on use of RW for LB dressing. Toilet Transfer: Oncologist Details (indicate cue type and reason): Simulated with transfer to recliner.         Functional mobility during ADLs: Supervision/safety;Min guard;Rolling walker       Vision       Perception     Praxis      Cognition Arousal/Alertness: Awake/alert Behavior During Therapy: WFL for tasks assessed/performed Overall Cognitive Status: Within Functional Limits for tasks assessed  Exercises     Shoulder Instructions       General Comments VSS on RA. Husband present at bedside for family education session.    Pertinent Vitals/ Pain       Pain Assessment: No/denies pain  Home Living                                           Prior Functioning/Environment              Frequency  Min 2X/week        Progress Toward Goals  OT Goals(current goals can now be found in the care plan section)  Progress towards OT goals: Progressing toward goals  Acute Rehab OT Goals Patient Stated Goal: return home OT Goal Formulation: With patient Time For Goal Achievement: 03/02/21 Potential to Achieve Goals: Good ADL Goals Pt Will Perform Grooming: with supervision;with set-up;standing Pt Will Perform Upper Body Bathing: with set-up;sitting Pt Will Perform Lower Body Bathing: with min guard assist;with supervision Pt Will Perform Upper Body Dressing: with set-up;sitting Pt Will Perform Lower Body Dressing: with min guard assist;with supervision Pt Will Transfer to Toilet: with supervision;ambulating Pt Will Perform Toileting - Clothing Manipulation and hygiene: with min guard assist  Plan Discharge plan remains appropriate    Co-evaluation                 AM-PAC OT "6 Clicks" Daily Activity     Outcome Measure   Help from another person eating meals?: None Help from another person taking care of personal grooming?: A Little Help from another person toileting, which includes using toliet, bedpan, or urinal?: A Little Help from another person bathing (including washing, rinsing, drying)?: A Little Help from another person to put on and taking off regular upper body clothing?: None Help from another person to put on and taking off regular lower body clothing?: A Little 6 Click Score: 20    End of Session Equipment Utilized During Treatment: Gait belt;Rolling walker  OT Visit Diagnosis: Unsteadiness on feet (R26.81);Other abnormalities of gait and mobility (R26.89);Muscle weakness (generalized) (M62.81)   Activity Tolerance Patient tolerated treatment well   Patient Left in chair;with call bell/phone within reach;with chair alarm set;with family/visitor present   Nurse Communication Mobility  status        Time: 8889-1694 OT Time Calculation (min): 23 min  Charges: OT General Charges $OT Visit: 1 Visit OT Treatments $Self Care/Home Management : 23-37 mins  Daisy Isidoro H. OTR/L Supplemental OT, Department of rehab services [phone]   Ledora Delker R H. 02/24/2021, 12:30 PM

## 2021-02-25 MED ORDER — AMOXICILLIN-POT CLAVULANATE 875-125 MG PO TABS: 1.0000 | ORAL_TABLET | Freq: Two times a day (BID) | ORAL | 0 refills | Status: AC

## 2021-02-25 NOTE — Progress Notes (Signed)
Subjective/Chief Complaint: Patient looks great this morning.  No pain.  Ready to go home  Objective: Vital signs in last 24 hours: Temp:  [97.9 F (36.6 C)-98.5 F (36.9 C)] 98.5 F (36.9 C) (06/02 0759) Pulse Rate:  [65-92] 92 (06/02 0759) Resp:  [19-20] 19 (06/02 0800) BP: (159-192)/(74-88) 159/88 (06/02 0759) SpO2:  [94 %] 94 % (06/02 0800) Last BM Date: 02/24/21  Intake/Output from previous day: 06/01 0701 - 06/02 0700 In: -  Out: 3400 [Urine:3400] Intake/Output this shift: No intake/output data recorded.  PE: Gen: NAD, laying in bed Abd: soft, NT, ND, +BS  Lab Results:  Recent Labs    02/23/21 0228 02/24/21 0345  WBC 24.7* 20.8*  HGB 11.1* 11.5*  HCT 32.0* 33.5*  PLT 376 365   BMET Recent Labs    02/23/21 0228 02/24/21 0345  NA 130* 132*  K 4.2 4.0  CL 98 97*  CO2 25 26  GLUCOSE 93 97  BUN <5* <5*  CREATININE 0.64 0.66  CALCIUM 8.3* 8.6*   PT/INR No results for input(s): LABPROT, INR in the last 72 hours. ABG No results for input(s): PHART, HCO3 in the last 72 hours.  Invalid input(s): PCO2, PO2  Studies/Results: No results found.  Anti-infectives: Anti-infectives (From admission, onward)   Start     Dose/Rate Route Frequency Ordered Stop   02/23/21 1415  amoxicillin-clavulanate (AUGMENTIN) 875-125 MG per tablet 1 tablet        1 tablet Oral Every 12 hours 02/23/21 1318     02/17/21 2315  piperacillin-tazobactam (ZOSYN) IVPB 3.375 g  Status:  Discontinued        3.375 g 12.5 mL/hr over 240 Minutes Intravenous Every 8 hours 02/17/21 2224 02/23/21 1318   02/15/21 1500  piperacillin-tazobactam (ZOSYN) IVPB 3.375 g  Status:  Discontinued        3.375 g 12.5 mL/hr over 240 Minutes Intravenous Every 8 hours 02/15/21 1412 02/17/21 2224   02/15/21 1230  ceFEPIme (MAXIPIME) 2 g in sodium chloride 0.9 % 100 mL IVPB  Status:  Discontinued        2 g 200 mL/hr over 30 Minutes Intravenous  Once 02/15/21 1221 02/15/21 1223   02/15/21 1230   metroNIDAZOLE (FLAGYL) IVPB 500 mg        500 mg 100 mL/hr over 60 Minutes Intravenous  Once 02/15/21 1221 02/15/21 1409   02/15/21 1230  ceFEPIme (MAXIPIME) 2 g in sodium chloride 0.9 % 100 mL IVPB  Status:  Discontinued        2 g 200 mL/hr over 30 Minutes Intravenous Every 12 hours 02/15/21 1223 02/15/21 1412      Assessment/Plan: HTN A. Fib on eliquis- on hold Postural dizziness Glaucoma  Hyponatremia/hypokalemia - per medicine for replacement Possible UTI - UA with +leukocytes and many bacteria, cx negative Hyperbilirubinemia - unclear etiology.  Other LFTs unremarkable.  Hepatitis panel is normal.  Perforated sigmoid diverticulitis with abscesses - long family meeting yesterday which I was in attendance for.  Patient decided against wanting surgery and wanting to go home and if she improves great and if she doesn't then hospice can be available. -patient actually looks well today.  No pain -WBC down to 20K yesterday -on oral augmentin. -likely another 7 days at discharge to total about 2 weeks worth of treatment -at this point as she appears improving, would not repeat a CT scan and treat her clinically. -surgically stable for DC home once all arrangements made for home. -given no desire  to want surgery, will have her follow up with her PCP as needed as an outpatient.   FEN: soft diet VTE: SCDs, eliquis per primary ID: [email or web address] 5/23; Zosyn 5/23>>5/31, Augmentin 5/31 -->  Henreitta Cea 02/25/2021

## 2021-02-25 NOTE — Discharge Summary (Signed)
Name: Daisy Williams MRN: [id number] DOB: 13-Jan-1928 87 y.o. PCP: Leonard Downing, MD  Date of Admission: 02/15/2021  9:20 AM Date of Discharge: 02/25/2021 Attending Physician: Aldine Contes, MD  Discharge Diagnosis: 1. Sepsis secondary to acute diverticulitis complicated by intra-abdominal abscess 2. Hypotonic hypovolemic hyponatremia  Discharge Medications: Allergies as of 02/25/2021   No Known Allergies      Medication List     TAKE these medications    amoxicillin-clavulanate 875-125 MG tablet Commonly known as: AUGMENTIN Take 1 tablet by mouth every 12 (twelve) hours for 7 days.   apixaban 2.5 MG Tabs tablet Commonly known as: ELIQUIS Take 1 tablet (2.5 mg total) by mouth 2 (two) times daily.   brimonidine 0.2 % ophthalmic solution Commonly known as: ALPHAGAN Place 1 drop into both eyes in the morning and at bedtime.   CALCIUM-VITAMIN D PO Take 2 tablets by mouth daily.   dorzolamide-timolol 22.3-6.8 MG/ML ophthalmic solution Commonly known as: COSOPT Place 1 drop into both eyes 2 (two) times daily.   latanoprost 0.005 % ophthalmic solution Commonly known as: XALATAN Place 1 drop into both eyes at bedtime.   metoprolol tartrate 25 MG tablet Commonly known as: LOPRESSOR TAKE 1/2 TABLET BY MOUTH 2 TIMES A DAY. PLEASE KEEP UPCOMING APPOINTMENT FOR FUTURE REFILLS. THANK YOU. What changed:   how much to take  how to take this  when to take this  additional instructions   VITAMIN B 12 PO Take 1 tablet by mouth daily.        Disposition and follow-up:   Ms.Johnna Rickey was discharged from [facility] in Good condition.  At the hospital follow up visit please address:  1. Oral antibiotic regimen. Continuing Augmentin will depend on goals of care as infection is unlikely to resolve with antibiotics alone and may worsen once antibiotics are discontinued.   2.  Labs / imaging needed at time of follow-up: Will depend on goals  of care. Could consider CT abdomen to follow intra-abdominal abscess if this will guide antibiotic decision.   3.  Pending labs/ test needing follow-up: none  Follow-up Appointments:  Follow-up Information     Leonard Downing, MD Follow up.   Specialty: Family Medicine Contact information: [place] [address] [place] Hospital Course by problem list: 1. Sepsis secondary to perforated colonic diverticulitis with multiple abscesses  Patient was initially admitted with abdominal pain, nausea, and anorexia and found to have severe sepsis secondary to perforated colonic diverticulitis with multiple abscesses, largest measuring 5.9x1.9x2.2. She was started on medical management with IV Zosyn as IR could not find a safe window for percutaneous drainage and she deferred surgery. WBC initially increased with increase in size of largest abscess on imaging, although IR was still unable to drain. Daisy Williams ultimately decided against surgical intervention and palliative care team became involved. Symptomatically, patient's abdominal pain resolved and at time of discharge she is able to tolerate regular diet. She is being discharged to home hospice on oral Augmentin for 7 days to complete a 2 week course. Continuing Augmentin beyond that point will depend on goals of care as infection is unlikely to resolve with antibiotics alone and may worsen once antibiotics are discontinued.   2. Hypotonic hypovolemic hyponatremia Na 122 at admission thought to be secondary to poor po intake due to abdominal pain and nausea. Na was gradually corrected with  IV NS until patient was able to tolerate po diet. Na stable at 132 at discharge.   Discharge Exam:   On day of discharge, Daisy Williams was seen resting comfortably in her bed. She says she enjoyed her breakfast and has no nausea or abdominal pain. She slept well last night and says she is feeling somewhat  stronger this morning and ready to go home. We discussed potential risks and benefits of continued outpatient oral antibiotic treatment; she ultimately decided to continue antibiotics for one week before re-evaluating with outpatient hospice provider.   BP (!) 151/43 (BP Location: Left Arm)   Pulse 90   Temp 98.4 F (36.9 C) (Oral)   Resp 20   Ht 5\' 3"  (1.6 m)   Wt 63.4 kg   SpO2 94%   BMI 24.76 kg/m  Discharge exam:  General: Older lady resting comfortably in bed in no acute distress HENT: Normocephalic, atraumatic. Mucus membranes moist. Hearing mildly impaired CV: Regular rate, irregularly irregular rhythm. No murmurs, rubs, or gallops appreciated. Minimal nonpitting BLE edema Pulm: Lungs CTAB. No wheezes or crackles. No increased work of breathing on room air Abd: Nontender, nondistended. No guarding or rebound. Normoactive bowel sounds Neuro: Alert and grossly oriented. Speech and cognition grossly intact. Moves all extremities spontaneously Psych: Appropriate affect. Through process linear, logical, and goal-directed  Pertinent Labs, Studies, and Procedures:  Labs: CBC Latest Ref Rng & Units 02/24/2021 02/23/2021 02/22/2021  WBC 4.0 - 10.5 K/uL 20.8(H) 24.7(H) 24.0(H)  Hemoglobin 12.0 - 15.0 g/dL 11.5(L) 11.1(L) 9.6(L)  Hematocrit 36.0 - 46.0 % 33.5(L) 32.0(L) 27.8(L)  Platelets 150 - 400 K/uL 365 376 338   CMP Latest Ref Rng & Units 02/24/2021 02/23/2021 02/22/2021  Glucose 70 - 99 mg/dL 97 93 131(H)  BUN 8 - 23 mg/dL <5(L) <5(L) 6(L)  Creatinine 0.44 - 1.00 mg/dL 0.66 0.64 0.58  Sodium 135 - 145 mmol/L 132(L) 130(L) 130(L)  Potassium 3.5 - 5.1 mmol/L 4.0 4.2 3.7  Chloride 98 - 111 mmol/L 97(L) 98 101  CO2 22 - 32 mmol/L 26 25 24   Calcium 8.9 - 10.3 mg/dL 8.6(L) 8.3(L) 7.9(L)  Total Protein 6.5 - 8.1 g/dL 5.4(L) 5.5(L) 4.6(L)  Total Bilirubin 0.3 - 1.2 mg/dL 0.8 1.1 0.7  Alkaline Phos 38 - 126 U/L 132(H) 151(H) 141(H)  AST 15 - 41 U/L 37 38 28  ALT 0 - 44 U/L 31 26 24     Imaging: CT Angio Abdomen/pelvis w/wo contrast 5/23:  1. Marked inflammatory changes of the pelvis, likely due to perforated sigmoid colon diverticulitis. Multiple small collections of fluid and air suspicious for abscesses. The largest is located in the left lateral pelvis measuring 5.9 x 1.9 x 2.2 cm. Small amounts of extraluminal air noted within the pelvis indicative of perforation. 2. Diffuse heterogeneous enhancement of the liver without discrete mass. Findings are nonspecific, however most likely due to vascular congestion or hepatitis.  CT Abdomen Pelvis wo contrast 5/29: 1. No change to slight increase in size of known presumed diverticular abscess involving the left hemipelvis and presumably involving the left adnexal structures. The amount of air within the collection has increased while the amount of fluid has reduced. If patient is symptoms are not improving with continued conservative management and there is a desire to attempt percutaneous aspiration and/or drainage catheter placement, would recommend further evaluation with IV contrast-enhanced CT scan of the abdomen and pelvis for procedural planning purposes (which may be performed immediately prior to attempted drainage catheter placement). 2. Mild  left-sided pelviectasis and ureterectasis presumably secondary to the infectious/inflammatory process within the left hemipelvis. 3. Interval improvement in previously noted small bowel distension. Large colonic stool burden without evidence of enteric obstruction. 4. Coronary calcifications.  Aortic Atherosclerosis (ICD10-I70.0). 5. Cardiomegaly with findings of pulmonary edema with small to moderate sized bilateral pleural effusions and associated diffuse body wall anasarca, unchanged to slightly progressed since the 05/25 examination.  CT Pelvis w/wo contrast 5/30: Similar appearing left pericolonic abscess, presumed diverticular in etiology. After scanning in  multiple positions, there is no safe window for percutaneous drainage.  Transthoracic Echocardiogram 5/25: 1. Left ventricular ejection fraction, by estimation, is 60 to 65%. The left ventricle has normal function. The left ventricle has no regional wall motion abnormalities. Left ventricular diastolic function could not be evaluated. 2. Right ventricular systolic function is normal. The right ventricular size is normal. There is moderately elevated pulmonary artery systolic pressure. 3. Left atrial size was severely dilated. 4. Right atrial size was severely dilated. 5. The mitral valve is normal in structure. Moderate mitral valve regurgitation. No evidence of mitral stenosis. 6. Tricuspid valve regurgitation is moderate to severe. 7. The aortic valve is tricuspid. There is mild calcification of the aortic valve. Aortic valve regurgitation is moderate. Mild aortic valve sclerosis is present, with no evidence of aortic valve stenosis. 8. The inferior vena cava is dilated in size with >50% respiratory variability, suggesting right atrial pressure of 8 mmHg. Comparison: Prior images unable to be directly viewed, comparison made by report only. Multiple valve problems have worsened since 2016 and there is a marked increase in the severity of pulmonary hypertension.  Discharge Instructions: Discharge Instructions     Call MD for:  persistant dizziness or light-headedness   Complete by: As directed    Call MD for:  persistant nausea and vomiting   Complete by: As directed    Call MD for:  severe uncontrolled pain   Complete by: As directed    Call MD for:  temperature >100.4   Complete by: As directed    Discharge instructions   Complete by: As directed    Mrs. Takaki,   You were admitted to the hospital for sepsis due to diverticulitis that was complicated by an abscess in your stomach. Unfortunately, we were unable to drain it, however we did treat with antibiotics. We are  sending you home with antibiotic pills to take twice daily for the next 7 days. After you finish this course, please follow up with your primary care doctor to decide on next steps regarding treatment.   It was a pleasure meeting you and we wish you the best.   - Dr. Charleen Kirks   Increase activity slowly   Complete by: As directed        Signed: Theodosia Blender, Medical Student 02/25/2021, 1:33 PM   Pager: [phone]   Attestation for Student Documentation:  I personally was present and performed or re-performed the history, physical exam and medical decision-making activities of this service and have verified that the service and findings are accurately documented in the student's note.  Jose Persia, MD 02/25/2021, 6:37 PM

## 2021-02-26 DIAGNOSIS — R339 Retention of urine, unspecified: Secondary | ICD-10-CM | POA: Diagnosis not present

## 2021-02-26 DIAGNOSIS — K5720 Diverticulitis of large intestine with perforation and abscess without bleeding: Secondary | ICD-10-CM | POA: Diagnosis not present

## 2021-02-26 DIAGNOSIS — I4891 Unspecified atrial fibrillation: Secondary | ICD-10-CM | POA: Diagnosis not present

## 2021-02-26 DIAGNOSIS — H409 Unspecified glaucoma: Secondary | ICD-10-CM | POA: Diagnosis not present

## 2021-02-26 DIAGNOSIS — R42 Dizziness and giddiness: Secondary | ICD-10-CM | POA: Diagnosis not present

## 2021-02-26 DIAGNOSIS — D649 Anemia, unspecified: Secondary | ICD-10-CM | POA: Diagnosis not present

## 2021-02-26 DIAGNOSIS — E871 Hypo-osmolality and hyponatremia: Secondary | ICD-10-CM | POA: Diagnosis not present

## 2021-02-26 DIAGNOSIS — Z6825 Body mass index (BMI) 25.0-25.9, adult: Secondary | ICD-10-CM | POA: Diagnosis not present

## 2021-02-26 DIAGNOSIS — I10 Essential (primary) hypertension: Secondary | ICD-10-CM | POA: Diagnosis not present

## 2021-02-26 DIAGNOSIS — I700 Atherosclerosis of aorta: Secondary | ICD-10-CM | POA: Diagnosis not present

## 2021-02-26 DIAGNOSIS — Z85820 Personal history of malignant melanoma of skin: Secondary | ICD-10-CM | POA: Diagnosis not present

## 2021-02-26 DIAGNOSIS — A419 Sepsis, unspecified organism: Secondary | ICD-10-CM | POA: Diagnosis not present

## 2021-02-26 DIAGNOSIS — E8809 Other disorders of plasma-protein metabolism, not elsewhere classified: Secondary | ICD-10-CM | POA: Diagnosis not present

## 2021-03-01 DIAGNOSIS — R339 Retention of urine, unspecified: Secondary | ICD-10-CM | POA: Diagnosis not present

## 2021-03-01 DIAGNOSIS — I700 Atherosclerosis of aorta: Secondary | ICD-10-CM | POA: Diagnosis not present

## 2021-03-01 DIAGNOSIS — E871 Hypo-osmolality and hyponatremia: Secondary | ICD-10-CM | POA: Diagnosis not present

## 2021-03-01 DIAGNOSIS — A419 Sepsis, unspecified organism: Secondary | ICD-10-CM | POA: Diagnosis not present

## 2021-03-01 DIAGNOSIS — I4891 Unspecified atrial fibrillation: Secondary | ICD-10-CM | POA: Diagnosis not present

## 2021-03-01 DIAGNOSIS — K5720 Diverticulitis of large intestine with perforation and abscess without bleeding: Secondary | ICD-10-CM | POA: Diagnosis not present

## 2021-03-02 DIAGNOSIS — I700 Atherosclerosis of aorta: Secondary | ICD-10-CM | POA: Diagnosis not present

## 2021-03-02 DIAGNOSIS — I4891 Unspecified atrial fibrillation: Secondary | ICD-10-CM | POA: Diagnosis not present

## 2021-03-02 DIAGNOSIS — K5720 Diverticulitis of large intestine with perforation and abscess without bleeding: Secondary | ICD-10-CM | POA: Diagnosis not present

## 2021-03-02 DIAGNOSIS — E871 Hypo-osmolality and hyponatremia: Secondary | ICD-10-CM | POA: Diagnosis not present

## 2021-03-02 DIAGNOSIS — A419 Sepsis, unspecified organism: Secondary | ICD-10-CM | POA: Diagnosis not present

## 2021-03-02 DIAGNOSIS — R339 Retention of urine, unspecified: Secondary | ICD-10-CM | POA: Diagnosis not present

## 2021-03-09 DIAGNOSIS — K5720 Diverticulitis of large intestine with perforation and abscess without bleeding: Secondary | ICD-10-CM | POA: Diagnosis not present

## 2021-03-09 DIAGNOSIS — R339 Retention of urine, unspecified: Secondary | ICD-10-CM | POA: Diagnosis not present

## 2021-03-09 DIAGNOSIS — I700 Atherosclerosis of aorta: Secondary | ICD-10-CM | POA: Diagnosis not present

## 2021-03-09 DIAGNOSIS — E871 Hypo-osmolality and hyponatremia: Secondary | ICD-10-CM | POA: Diagnosis not present

## 2021-03-09 DIAGNOSIS — A419 Sepsis, unspecified organism: Secondary | ICD-10-CM | POA: Diagnosis not present

## 2021-03-09 DIAGNOSIS — I4891 Unspecified atrial fibrillation: Secondary | ICD-10-CM | POA: Diagnosis not present

## 2021-03-15 DIAGNOSIS — A419 Sepsis, unspecified organism: Secondary | ICD-10-CM | POA: Diagnosis not present

## 2021-03-15 DIAGNOSIS — E871 Hypo-osmolality and hyponatremia: Secondary | ICD-10-CM | POA: Diagnosis not present

## 2021-03-15 DIAGNOSIS — I700 Atherosclerosis of aorta: Secondary | ICD-10-CM | POA: Diagnosis not present

## 2021-03-15 DIAGNOSIS — R339 Retention of urine, unspecified: Secondary | ICD-10-CM | POA: Diagnosis not present

## 2021-03-15 DIAGNOSIS — I4891 Unspecified atrial fibrillation: Secondary | ICD-10-CM | POA: Diagnosis not present

## 2021-03-15 DIAGNOSIS — K5720 Diverticulitis of large intestine with perforation and abscess without bleeding: Secondary | ICD-10-CM | POA: Diagnosis not present

## 2021-03-16 DIAGNOSIS — R339 Retention of urine, unspecified: Secondary | ICD-10-CM | POA: Diagnosis not present

## 2021-03-16 DIAGNOSIS — I4891 Unspecified atrial fibrillation: Secondary | ICD-10-CM | POA: Diagnosis not present

## 2021-03-16 DIAGNOSIS — I700 Atherosclerosis of aorta: Secondary | ICD-10-CM | POA: Diagnosis not present

## 2021-03-16 DIAGNOSIS — E871 Hypo-osmolality and hyponatremia: Secondary | ICD-10-CM | POA: Diagnosis not present

## 2021-03-16 DIAGNOSIS — A419 Sepsis, unspecified organism: Secondary | ICD-10-CM | POA: Diagnosis not present

## 2021-03-16 DIAGNOSIS — K5720 Diverticulitis of large intestine with perforation and abscess without bleeding: Secondary | ICD-10-CM | POA: Diagnosis not present

## 2021-03-23 DIAGNOSIS — E871 Hypo-osmolality and hyponatremia: Secondary | ICD-10-CM | POA: Diagnosis not present

## 2021-03-23 DIAGNOSIS — I4891 Unspecified atrial fibrillation: Secondary | ICD-10-CM | POA: Diagnosis not present

## 2021-03-23 DIAGNOSIS — I700 Atherosclerosis of aorta: Secondary | ICD-10-CM | POA: Diagnosis not present

## 2021-03-23 DIAGNOSIS — A419 Sepsis, unspecified organism: Secondary | ICD-10-CM | POA: Diagnosis not present

## 2021-03-23 DIAGNOSIS — K5720 Diverticulitis of large intestine with perforation and abscess without bleeding: Secondary | ICD-10-CM | POA: Diagnosis not present

## 2021-03-23 DIAGNOSIS — R339 Retention of urine, unspecified: Secondary | ICD-10-CM | POA: Diagnosis not present

## 2021-03-26 DIAGNOSIS — K5720 Diverticulitis of large intestine with perforation and abscess without bleeding: Secondary | ICD-10-CM | POA: Diagnosis not present

## 2021-03-26 DIAGNOSIS — Z6825 Body mass index (BMI) 25.0-25.9, adult: Secondary | ICD-10-CM | POA: Diagnosis not present

## 2021-03-26 DIAGNOSIS — R339 Retention of urine, unspecified: Secondary | ICD-10-CM | POA: Diagnosis not present

## 2021-03-26 DIAGNOSIS — A419 Sepsis, unspecified organism: Secondary | ICD-10-CM | POA: Diagnosis not present

## 2021-03-26 DIAGNOSIS — I4891 Unspecified atrial fibrillation: Secondary | ICD-10-CM | POA: Diagnosis not present

## 2021-03-26 DIAGNOSIS — Z85820 Personal history of malignant melanoma of skin: Secondary | ICD-10-CM | POA: Diagnosis not present

## 2021-03-26 DIAGNOSIS — E871 Hypo-osmolality and hyponatremia: Secondary | ICD-10-CM | POA: Diagnosis not present

## 2021-03-26 DIAGNOSIS — I700 Atherosclerosis of aorta: Secondary | ICD-10-CM | POA: Diagnosis not present

## 2021-03-26 DIAGNOSIS — D649 Anemia, unspecified: Secondary | ICD-10-CM | POA: Diagnosis not present

## 2021-03-26 DIAGNOSIS — E8809 Other disorders of plasma-protein metabolism, not elsewhere classified: Secondary | ICD-10-CM | POA: Diagnosis not present

## 2021-03-26 DIAGNOSIS — H409 Unspecified glaucoma: Secondary | ICD-10-CM | POA: Diagnosis not present

## 2021-03-26 DIAGNOSIS — R42 Dizziness and giddiness: Secondary | ICD-10-CM | POA: Diagnosis not present

## 2021-03-26 DIAGNOSIS — I10 Essential (primary) hypertension: Secondary | ICD-10-CM | POA: Diagnosis not present

## 2021-03-30 DIAGNOSIS — R339 Retention of urine, unspecified: Secondary | ICD-10-CM | POA: Diagnosis not present

## 2021-03-30 DIAGNOSIS — E871 Hypo-osmolality and hyponatremia: Secondary | ICD-10-CM | POA: Diagnosis not present

## 2021-03-30 DIAGNOSIS — I4891 Unspecified atrial fibrillation: Secondary | ICD-10-CM | POA: Diagnosis not present

## 2021-03-30 DIAGNOSIS — A419 Sepsis, unspecified organism: Secondary | ICD-10-CM | POA: Diagnosis not present

## 2021-03-30 DIAGNOSIS — K5720 Diverticulitis of large intestine with perforation and abscess without bleeding: Secondary | ICD-10-CM | POA: Diagnosis not present

## 2021-03-30 DIAGNOSIS — I700 Atherosclerosis of aorta: Secondary | ICD-10-CM | POA: Diagnosis not present

## 2021-04-06 DIAGNOSIS — I4891 Unspecified atrial fibrillation: Secondary | ICD-10-CM | POA: Diagnosis not present

## 2021-04-06 DIAGNOSIS — I700 Atherosclerosis of aorta: Secondary | ICD-10-CM | POA: Diagnosis not present

## 2021-04-06 DIAGNOSIS — R339 Retention of urine, unspecified: Secondary | ICD-10-CM | POA: Diagnosis not present

## 2021-04-06 DIAGNOSIS — E871 Hypo-osmolality and hyponatremia: Secondary | ICD-10-CM | POA: Diagnosis not present

## 2021-04-06 DIAGNOSIS — K5720 Diverticulitis of large intestine with perforation and abscess without bleeding: Secondary | ICD-10-CM | POA: Diagnosis not present

## 2021-04-06 DIAGNOSIS — A419 Sepsis, unspecified organism: Secondary | ICD-10-CM | POA: Diagnosis not present

## 2021-04-07 DIAGNOSIS — I4891 Unspecified atrial fibrillation: Secondary | ICD-10-CM | POA: Diagnosis not present

## 2021-04-07 DIAGNOSIS — I700 Atherosclerosis of aorta: Secondary | ICD-10-CM | POA: Diagnosis not present

## 2021-04-07 DIAGNOSIS — K5720 Diverticulitis of large intestine with perforation and abscess without bleeding: Secondary | ICD-10-CM | POA: Diagnosis not present

## 2021-04-07 DIAGNOSIS — R339 Retention of urine, unspecified: Secondary | ICD-10-CM | POA: Diagnosis not present

## 2021-04-07 DIAGNOSIS — A419 Sepsis, unspecified organism: Secondary | ICD-10-CM | POA: Diagnosis not present

## 2021-04-07 DIAGNOSIS — E871 Hypo-osmolality and hyponatremia: Secondary | ICD-10-CM | POA: Diagnosis not present

## 2021-04-09 DIAGNOSIS — I700 Atherosclerosis of aorta: Secondary | ICD-10-CM | POA: Diagnosis not present

## 2021-04-09 DIAGNOSIS — R339 Retention of urine, unspecified: Secondary | ICD-10-CM | POA: Diagnosis not present

## 2021-04-09 DIAGNOSIS — K5720 Diverticulitis of large intestine with perforation and abscess without bleeding: Secondary | ICD-10-CM | POA: Diagnosis not present

## 2021-04-09 DIAGNOSIS — I4891 Unspecified atrial fibrillation: Secondary | ICD-10-CM | POA: Diagnosis not present

## 2021-04-09 DIAGNOSIS — E871 Hypo-osmolality and hyponatremia: Secondary | ICD-10-CM | POA: Diagnosis not present

## 2021-04-09 DIAGNOSIS — A419 Sepsis, unspecified organism: Secondary | ICD-10-CM | POA: Diagnosis not present

## 2021-04-13 DIAGNOSIS — A419 Sepsis, unspecified organism: Secondary | ICD-10-CM | POA: Diagnosis not present

## 2021-04-13 DIAGNOSIS — R339 Retention of urine, unspecified: Secondary | ICD-10-CM | POA: Diagnosis not present

## 2021-04-13 DIAGNOSIS — E871 Hypo-osmolality and hyponatremia: Secondary | ICD-10-CM | POA: Diagnosis not present

## 2021-04-13 DIAGNOSIS — I700 Atherosclerosis of aorta: Secondary | ICD-10-CM | POA: Diagnosis not present

## 2021-04-13 DIAGNOSIS — K5720 Diverticulitis of large intestine with perforation and abscess without bleeding: Secondary | ICD-10-CM | POA: Diagnosis not present

## 2021-04-13 DIAGNOSIS — I4891 Unspecified atrial fibrillation: Secondary | ICD-10-CM | POA: Diagnosis not present

## 2021-04-20 DIAGNOSIS — A419 Sepsis, unspecified organism: Secondary | ICD-10-CM | POA: Diagnosis not present

## 2021-04-20 DIAGNOSIS — E871 Hypo-osmolality and hyponatremia: Secondary | ICD-10-CM | POA: Diagnosis not present

## 2021-04-20 DIAGNOSIS — K5720 Diverticulitis of large intestine with perforation and abscess without bleeding: Secondary | ICD-10-CM | POA: Diagnosis not present

## 2021-04-20 DIAGNOSIS — I700 Atherosclerosis of aorta: Secondary | ICD-10-CM | POA: Diagnosis not present

## 2021-04-20 DIAGNOSIS — I4891 Unspecified atrial fibrillation: Secondary | ICD-10-CM | POA: Diagnosis not present

## 2021-04-20 DIAGNOSIS — R339 Retention of urine, unspecified: Secondary | ICD-10-CM | POA: Diagnosis not present

## 2021-04-26 DIAGNOSIS — D649 Anemia, unspecified: Secondary | ICD-10-CM | POA: Diagnosis not present

## 2021-04-26 DIAGNOSIS — E871 Hypo-osmolality and hyponatremia: Secondary | ICD-10-CM | POA: Diagnosis not present

## 2021-04-26 DIAGNOSIS — K5720 Diverticulitis of large intestine with perforation and abscess without bleeding: Secondary | ICD-10-CM | POA: Diagnosis not present

## 2021-04-26 DIAGNOSIS — R339 Retention of urine, unspecified: Secondary | ICD-10-CM | POA: Diagnosis not present

## 2021-04-26 DIAGNOSIS — E8809 Other disorders of plasma-protein metabolism, not elsewhere classified: Secondary | ICD-10-CM | POA: Diagnosis not present

## 2021-04-26 DIAGNOSIS — A419 Sepsis, unspecified organism: Secondary | ICD-10-CM | POA: Diagnosis not present

## 2021-04-26 DIAGNOSIS — I700 Atherosclerosis of aorta: Secondary | ICD-10-CM | POA: Diagnosis not present

## 2021-04-26 DIAGNOSIS — Z85820 Personal history of malignant melanoma of skin: Secondary | ICD-10-CM | POA: Diagnosis not present

## 2021-04-26 DIAGNOSIS — I10 Essential (primary) hypertension: Secondary | ICD-10-CM | POA: Diagnosis not present

## 2021-04-26 DIAGNOSIS — H409 Unspecified glaucoma: Secondary | ICD-10-CM | POA: Diagnosis not present

## 2021-04-26 DIAGNOSIS — I4891 Unspecified atrial fibrillation: Secondary | ICD-10-CM | POA: Diagnosis not present

## 2021-04-26 DIAGNOSIS — Z6825 Body mass index (BMI) 25.0-25.9, adult: Secondary | ICD-10-CM | POA: Diagnosis not present

## 2021-04-26 DIAGNOSIS — H401132 Primary open-angle glaucoma, bilateral, moderate stage: Secondary | ICD-10-CM | POA: Diagnosis not present

## 2021-04-26 DIAGNOSIS — R42 Dizziness and giddiness: Secondary | ICD-10-CM | POA: Diagnosis not present

## 2021-04-27 DIAGNOSIS — A419 Sepsis, unspecified organism: Secondary | ICD-10-CM | POA: Diagnosis not present

## 2021-04-27 DIAGNOSIS — R339 Retention of urine, unspecified: Secondary | ICD-10-CM | POA: Diagnosis not present

## 2021-04-27 DIAGNOSIS — I4891 Unspecified atrial fibrillation: Secondary | ICD-10-CM | POA: Diagnosis not present

## 2021-04-27 DIAGNOSIS — E871 Hypo-osmolality and hyponatremia: Secondary | ICD-10-CM | POA: Diagnosis not present

## 2021-04-27 DIAGNOSIS — I700 Atherosclerosis of aorta: Secondary | ICD-10-CM | POA: Diagnosis not present

## 2021-04-27 DIAGNOSIS — K5720 Diverticulitis of large intestine with perforation and abscess without bleeding: Secondary | ICD-10-CM | POA: Diagnosis not present

## 2021-05-04 DIAGNOSIS — A419 Sepsis, unspecified organism: Secondary | ICD-10-CM | POA: Diagnosis not present

## 2021-05-04 DIAGNOSIS — K5720 Diverticulitis of large intestine with perforation and abscess without bleeding: Secondary | ICD-10-CM | POA: Diagnosis not present

## 2021-05-04 DIAGNOSIS — E871 Hypo-osmolality and hyponatremia: Secondary | ICD-10-CM | POA: Diagnosis not present

## 2021-05-04 DIAGNOSIS — I700 Atherosclerosis of aorta: Secondary | ICD-10-CM | POA: Diagnosis not present

## 2021-05-04 DIAGNOSIS — R339 Retention of urine, unspecified: Secondary | ICD-10-CM | POA: Diagnosis not present

## 2021-05-04 DIAGNOSIS — I4891 Unspecified atrial fibrillation: Secondary | ICD-10-CM | POA: Diagnosis not present

## 2021-05-11 DIAGNOSIS — E871 Hypo-osmolality and hyponatremia: Secondary | ICD-10-CM | POA: Diagnosis not present

## 2021-05-11 DIAGNOSIS — K5720 Diverticulitis of large intestine with perforation and abscess without bleeding: Secondary | ICD-10-CM | POA: Diagnosis not present

## 2021-05-11 DIAGNOSIS — I700 Atherosclerosis of aorta: Secondary | ICD-10-CM | POA: Diagnosis not present

## 2021-05-11 DIAGNOSIS — R339 Retention of urine, unspecified: Secondary | ICD-10-CM | POA: Diagnosis not present

## 2021-05-11 DIAGNOSIS — A419 Sepsis, unspecified organism: Secondary | ICD-10-CM | POA: Diagnosis not present

## 2021-05-11 DIAGNOSIS — I4891 Unspecified atrial fibrillation: Secondary | ICD-10-CM | POA: Diagnosis not present

## 2021-05-12 DIAGNOSIS — I700 Atherosclerosis of aorta: Secondary | ICD-10-CM | POA: Diagnosis not present

## 2021-05-12 DIAGNOSIS — R339 Retention of urine, unspecified: Secondary | ICD-10-CM | POA: Diagnosis not present

## 2021-05-12 DIAGNOSIS — E871 Hypo-osmolality and hyponatremia: Secondary | ICD-10-CM | POA: Diagnosis not present

## 2021-05-12 DIAGNOSIS — I4891 Unspecified atrial fibrillation: Secondary | ICD-10-CM | POA: Diagnosis not present

## 2021-05-12 DIAGNOSIS — K5720 Diverticulitis of large intestine with perforation and abscess without bleeding: Secondary | ICD-10-CM | POA: Diagnosis not present

## 2021-05-12 DIAGNOSIS — A419 Sepsis, unspecified organism: Secondary | ICD-10-CM | POA: Diagnosis not present

## 2021-05-13 DIAGNOSIS — R339 Retention of urine, unspecified: Secondary | ICD-10-CM | POA: Diagnosis not present

## 2021-05-13 DIAGNOSIS — I4891 Unspecified atrial fibrillation: Secondary | ICD-10-CM | POA: Diagnosis not present

## 2021-05-13 DIAGNOSIS — A419 Sepsis, unspecified organism: Secondary | ICD-10-CM | POA: Diagnosis not present

## 2021-05-13 DIAGNOSIS — K5720 Diverticulitis of large intestine with perforation and abscess without bleeding: Secondary | ICD-10-CM | POA: Diagnosis not present

## 2021-05-13 DIAGNOSIS — E871 Hypo-osmolality and hyponatremia: Secondary | ICD-10-CM | POA: Diagnosis not present

## 2021-05-13 DIAGNOSIS — I700 Atherosclerosis of aorta: Secondary | ICD-10-CM | POA: Diagnosis not present

## 2021-05-14 ENCOUNTER — Telehealth: Payer: Self-pay

## 2021-05-14 NOTE — Telephone Encounter (Signed)
Spoke with patient and scheduled an in-person Palliative Consult for 05/24/21 @ [place] screening was negative. No pets in home. Patient lives with husband.  Consent obtained; updated Outlook/Netsmart/Team List and Epic.   Family is aware they may be receiving a call from provider the day before or day of to confirm appointment.

## 2021-05-17 DIAGNOSIS — R7303 Prediabetes: Secondary | ICD-10-CM | POA: Diagnosis not present

## 2021-05-17 DIAGNOSIS — R5383 Other fatigue: Secondary | ICD-10-CM | POA: Diagnosis not present

## 2021-05-17 DIAGNOSIS — I4891 Unspecified atrial fibrillation: Secondary | ICD-10-CM | POA: Diagnosis not present

## 2021-05-17 DIAGNOSIS — K314 Gastric diverticulum: Secondary | ICD-10-CM | POA: Diagnosis not present

## 2021-05-20 DIAGNOSIS — H401132 Primary open-angle glaucoma, bilateral, moderate stage: Secondary | ICD-10-CM | POA: Diagnosis not present

## 2021-05-24 ENCOUNTER — Other Ambulatory Visit: Payer: Self-pay

## 2021-05-24 ENCOUNTER — Other Ambulatory Visit: Payer: Medicare Other | Admitting: Student

## 2021-05-24 DIAGNOSIS — Z515 Encounter for palliative care: Secondary | ICD-10-CM | POA: Diagnosis not present

## 2021-05-24 DIAGNOSIS — K5720 Diverticulitis of large intestine with perforation and abscess without bleeding: Secondary | ICD-10-CM

## 2021-05-24 DIAGNOSIS — K5900 Constipation, unspecified: Secondary | ICD-10-CM

## 2021-05-24 DIAGNOSIS — R630 Anorexia: Secondary | ICD-10-CM | POA: Diagnosis not present

## 2021-05-24 DIAGNOSIS — L989 Disorder of the skin and subcutaneous tissue, unspecified: Secondary | ICD-10-CM

## 2021-05-24 NOTE — Progress Notes (Signed)
  [facility] Palliative [facility] Consult Note Telephone: [phone]  Fax: [phone]   Date of encounter: 05/24/21 3:17 PM PATIENT NAME: Daisy Williams [address] [place] [address]   [phone] (home)  DOB: 01/16/1928 MRN: [id number] PRIMARY CARE PROVIDER:    Elkins, Wilson Oliver, MD,  [address] [place] [address] [phone]  REFERRING PROVIDER:   Elkins, Wilson Oliver, MD [address] [place],  [place] [address] [phone]  RESPONSIBLE PARTY:    Contact Information     Name Relation Home Work Mobile   Robertson, Gwenn Daughter   [phone]   Bradsher,James Spouse [phone]     Radloff, Shane Grandson   [phone]        I met face to face with patient and family in the home. Palliative Care was asked to follow this patient by consultation request of  Elkins, Wilson Oliver, * to address advance care planning and complex medical decision making. This is the initial visit.                                     ASSESSMENT AND PLAN / RECOMMENDATIONS:   Advance Care Planning/Goals of Care: Goals include to maximize quality of life and symptom management. Patient/health care surrogate gave his/her permission to discuss.Our advance care planning conversation included a discussion about:    The value and importance of advance care planning  Experiences with loved ones who have been seriously ill or have died  Exploration of personal, cultural or spiritual beliefs that might influence medical decisions  Exploration of goals of care in the event of a sudden injury or illness  Identification and preparation of a healthcare agent  MOST form reviewed CODE STATUS: DNR  Education provided on Palliative Medicine vs. Hospice services. Palliative Medicine will continue to provide support, monitor for changes or declines. Patient would like to be managed in the home if possible.   Symptom Management/Plan:  Sigmoid  diverticular perforation and abscess formation-patient has improved; discharged from hospice due to stability. Monitor for any symptoms, recurrence.   Left leg lesion-area with pus filled center, mild erythema to peri area. Patient has appointment scheduled tomorrow. Recommend warm compress TID, elevate leg. Will defer further treatment to PCP.  Constipation-continue Senna-S one tablet BID.   Appetite-patient with fair appetite; recommend foods patient enjoys. Continue Ensure twice daily.   Follow up Palliative Care Visit: Palliative care will continue to follow for complex medical decision making, advance care planning, and clarification of goals. Return in 8 weeks or prn.  I spent 60 minutes providing this consultation. More than 50% of the time in this consultation was spent in counseling and care coordination.   PPS: 50%  HOSPICE ELIGIBILITY/DIAGNOSIS: TBD  Chief Complaint: Palliative Medicine initial consult.   HISTORY OF PRESENT ILLNESS:  Daisy Williams is a 87 y.o. year old female  with Aortic atherosclerosis, atrial fibrillation, hypertension, dizziness, anemia, hypoalbuminemia, glaucoma. Patient was followed under hospice services due to sigmoid diverticular perforation and abscess formation. Patient recently discharged from hospice due to stability on 05/14/21.   Patient reports doing well. She denies any pain, dyspnea, nausea. She is taking senna-S for constipation. She endorses a fair appetite; her weight is up to 117 pounds. She is drinking 2 Ensures daily. Sleeping well at night. No falls. Able to complete adl's. No longer using walker. Still driving some. Patient has an area to left   lower extremity that she states flared up in the past 2 days. She denies any fever, chills, warmth to area. Patient has appointment scheduled with PCP tomorrow for area to left leg; she also states they are going to follow up on her potassium level being elevated. A 10-point review of systems is  negative, except for the pertinent positives and negatives detailed in the HPI.    History obtained from review of EMR, discussion with primary team, and interview with family, facility staff/caregiver and/or [name].  I reviewed available labs, medications, imaging, studies and related documents from the EMR.  Records reviewed and summarized above.    Physical Exam: Pulse 72, resp 16, b/p 118/60, sats 98% on room air Constitutional: NAD General: frail appearing thin EYES: anicteric sclera, lids intact, no discharge  ENMT: intact hearing, oral mucous membranes moist, dentition intact CV: S1S2, HR regular/irregular, trace LE edema Pulmonary: LCTA, no increased work of breathing, no cough Abdomen: normo-active BS + 4 quadrants, soft and non tender GU: deferred MSK: moves all extremities, ambulatory Skin: warm and dry, no rashes. Raised lesion to LLE, pus to center of wound, mild erythema to peri area.  Neuro:  no generalized weakness, A & O x 3 Psych: non-anxious affect Hem/lymph/immuno: no widespread bruising CURRENT PROBLEM LIST:  Patient Active Problem List   Diagnosis Date Noted   Wheezing    Urinary retention 02/17/2021   Colonic diverticular abscess 02/17/2021   Hyponatremia 02/17/2021   Aortic atherosclerosis (HCC) 02/17/2021   Sepsis (HCC) 02/15/2021   Persistent atrial fibrillation (HCC) 09/27/2017   RBBB 09/27/2017   Essential hypertension 12/13/2016   Current use of long term anticoagulation 12/13/2016   Postural dizziness 02/06/2015   Atrial fibrillation with controlled ventricular response (HCC) 02/06/2015   Dehydration 02/06/2015   Macrocytosis 02/06/2015   Acute hyperglycemia 02/06/2015   Prolonged QT interval 02/06/2015   Glaucoma 02/06/2015   Elevated blood pressure 02/06/2015   Vomiting 02/06/2015   Dizziness 02/06/2015   Microscopic hematuria 02/06/2015   PAST MEDICAL HISTORY:  Active Ambulatory Problems    Diagnosis Date Noted   Postural  dizziness 02/06/2015   Atrial fibrillation with controlled ventricular response (HCC) 02/06/2015   Dehydration 02/06/2015   Macrocytosis 02/06/2015   Acute hyperglycemia 02/06/2015   Prolonged QT interval 02/06/2015   Glaucoma 02/06/2015   Elevated blood pressure 02/06/2015   Vomiting 02/06/2015   Dizziness 02/06/2015   Microscopic hematuria 02/06/2015   Essential hypertension 12/13/2016   Current use of long term anticoagulation 12/13/2016   Persistent atrial fibrillation (HCC) 09/27/2017   RBBB 09/27/2017   Sepsis (HCC) 02/15/2021   Urinary retention 02/17/2021   Colonic diverticular abscess 02/17/2021   Hyponatremia 02/17/2021   Aortic atherosclerosis (HCC) 02/17/2021   Wheezing    Resolved Ambulatory Problems    Diagnosis Date Noted   No Resolved Ambulatory Problems   Past Medical History:  Diagnosis Date   Hematuria    Inner ear dysfunction    Melanoma (HCC)    SOCIAL HX:  Social History   Tobacco Use   Smoking status: Never   Smokeless tobacco: Never  Substance Use Topics   Alcohol use: No    Alcohol/week: 0.0 standard drinks   FAMILY HX:  Family History  Problem Relation Age of Onset   Colon cancer Mother    Heart disease Father        Father passed away at 76. Was taking heart medicine - "didn't last a month after they put him on it."     Heart disease Brother        Brother was on heart medicine. He went off his heart medicine and died shortly after - age 76. (heart attack?)   Other Sister        Sister was a nursing home patient and passed away at 74 of unknown cause.     ALLERGIES: No Known Allergies   PERTINENT MEDICATIONS:  Outpatient Encounter Medications as of 05/24/2021  Medication Sig   apixaban (ELIQUIS) 2.5 MG TABS tablet Take 1 tablet (2.5 mg total) by mouth 2 (two) times daily.   brimonidine (ALPHAGAN) 0.2 % ophthalmic solution Place 1 drop into both eyes in the morning and at bedtime.   CALCIUM-VITAMIN D PO Take 2 tablets by mouth daily.    Cyanocobalamin (VITAMIN B 12 PO) Take 1 tablet by mouth daily.   dorzolamide-timolol (COSOPT) 22.3-6.8 MG/ML ophthalmic solution Place 1 drop into both eyes 2 (two) times daily.    latanoprost (XALATAN) 0.005 % ophthalmic solution Place 1 drop into both eyes at bedtime.   metoprolol tartrate (LOPRESSOR) 25 MG tablet TAKE 1/2 TABLET BY MOUTH 2 TIMES A DAY. PLEASE KEEP UPCOMING APPOINTMENT FOR FUTURE REFILLS. THANK YOU. (Patient taking differently: Take 12.5 mg by mouth 2 (two) times daily.)   No facility-administered encounter medications on file as of 05/24/2021.   Thank you for the opportunity to participate in the care of Ms. Scullin.  The palliative care team will continue to follow. Please call our office at [phone] if we can be of additional assistance.   LaToya S Rivers, NP   COVID-19 PATIENT SCREENING TOOL Asked and negative response unless otherwise noted:  Have you had symptoms of covid, tested positive or been in contact with someone with symptoms/positive test in the past 5-10 days? No 

## 2021-06-07 DIAGNOSIS — R630 Anorexia: Secondary | ICD-10-CM | POA: Diagnosis not present

## 2021-06-07 DIAGNOSIS — L988 Other specified disorders of the skin and subcutaneous tissue: Secondary | ICD-10-CM | POA: Diagnosis not present

## 2021-06-07 DIAGNOSIS — E875 Hyperkalemia: Secondary | ICD-10-CM | POA: Diagnosis not present

## 2021-06-07 DIAGNOSIS — R945 Abnormal results of liver function studies: Secondary | ICD-10-CM | POA: Diagnosis not present

## 2021-06-07 DIAGNOSIS — L859 Epidermal thickening, unspecified: Secondary | ICD-10-CM | POA: Diagnosis not present

## 2021-09-13 DIAGNOSIS — H401132 Primary open-angle glaucoma, bilateral, moderate stage: Secondary | ICD-10-CM | POA: Diagnosis not present

## 2021-11-15 ENCOUNTER — Other Ambulatory Visit: Payer: Self-pay

## 2021-11-15 ENCOUNTER — Other Ambulatory Visit: Payer: Medicare Other

## 2021-11-15 DIAGNOSIS — Z515 Encounter for palliative care: Secondary | ICD-10-CM

## 2021-11-15 NOTE — Progress Notes (Signed)
PATIENT NAME: Daisy Williams DOB: 08-20-1928 MRN: [id number]  PRIMARY CARE PROVIDER: Leonard Downing, MD  RESPONSIBLE PARTY:  Acct ID - Guarantor Home Phone Work Phone Relationship Acct Type  [id number] - [place],[phone]  Self P/F     [address], [place], [place] [address]    VIRTUAL CHECK-IN VISIT  STATEMENT Due to the COVID-19 crisis, this virtual check-in visit was done via telephone from my office and it was initiated and consent by this patient and or family.  COMMUNITY PALLIATIVE CARE RN NOTE     PLAN OF CARE and INTERVENTION:  ADVANCE CARE PLANNING/GOALS OF CARE: to remain in her home, DNR. CAREGIVER EDUCATION: Safety, pain management reinforced to call with questions/concerns. DISEASE STATUS: RN Palliative care telephonic encounter completed today with patient. Patient answered the phone. Alert and engaging in conversation. Patient shared that she is able to drive herself to church and she was able to go out and get a perm. She denies any pain or shortness of breath. No recent falls, ambulatory without assistance. She shares that she tries to be careful. Patient has awareness of fall risks. Patient reports that she does not have much of an appetite. She does try to eat 3 meals a day and takes in 2 ensures a day. She denies any constipation and reports she manages with eating prunes. Encouraged patient to call with any concerns or changes in her condition. Patient expressed understanding and appreciation.  HISTORY OF PRESENT ILLNESS: This is a 24- year-old female with a diagnosis of Aortic Atherosclerosis, A-fib, and hypertension. Palliative care has been asked to follow for additional support, care and complex decision making.   CODE STATUS: DNR PPS: 50% Physical Exam: deferred.     Duration: 45 min       Dietrich Pates, BSN    Cornelius Moras, RN

## 2021-12-08 ENCOUNTER — Other Ambulatory Visit: Payer: Self-pay | Admitting: Internal Medicine

## 2021-12-08 DIAGNOSIS — I4819 Other persistent atrial fibrillation: Secondary | ICD-10-CM

## 2021-12-10 ENCOUNTER — Other Ambulatory Visit: Payer: Self-pay

## 2021-12-10 DIAGNOSIS — I4819 Other persistent atrial fibrillation: Secondary | ICD-10-CM

## 2021-12-10 MED ORDER — METOPROLOL TARTRATE 25 MG PO TABS: ORAL_TABLET | ORAL | 3 refills | Status: DC

## 2022-01-02 NOTE — Progress Notes (Signed)
?Cardiology Office Note:   ? ?Date:  01/06/2022  ? ?Daisy Williams, DOB 01/18/1928, MRN [id number] ? ?PCP:  Leonard Downing, MD  ?Cardiologist:  Pixie Casino, MD  ?Electrophysiologist:  None  ? ?Referring MD: Leonard Downing, *  ? ?Chief Complaint: follow-up of atrial fibrillation ? ?History of Present Illness:   ? ?Daisy Williams is a 88 y.o. female with a history of permanent atrial fibrillation on Eliquis, valvular disease, RBBB, hypertension, glaucoma, and melanoma who is followed by Dr. Debara Pickett and presents today for routine follow-up. ? ?Patient was admitted in 2016 for new onset atrial fibrillation.  Echo at that time showed normal LV function.  There was concern at that time for possible posterior circulation stroke given dizziness and nausea but head CT and brain MRI were unremarkable.  She was started on Eliquis but she was not started on rate control therapy due to her IVCD.  Patient was last seen by Dr. Debara Pickett in 11/2020 at which time she was doing well from a cardiac standpoint.  ? ?She was admitted in May/June 2022 for sepsis secondary to perforated colonic diverticulitis with multiple abscesses and hypotonic hypovolemic hyponatremia. Echo during admission showed LVEF of 60-65% with normal wall motion, severe biatrial enlargement, moderate MR, moderate to severe TR, moderate AI, and moderately elevated PASP.  She was treated with IV antibiotics.  She now follows with palliative care as an outpatient. ? ?Patient presents today for follow-up. Here alone. She is doing very well from a cardiac standpoint and denies any chest pain, shortness of breath, orthopnea, PND, lower extremity edema, palpitations, lightheadedness, dizziness, syncope. BP soft in the office in the 90s/40s but she is asymptomatic with this. She does not routinely check her BP at home. She states she is no longer taking Eliquis because she was having some blood in her stool after she was admitted with diverticulitis.  Bleeding resolved after Eliquis was stopped. She does not want to restart this. ? ?Past Medical History:  ?Diagnosis Date  ? Glaucoma   ? Hematuria   ? Patient reports PCP has followed, improved since using cranberry juice  ? Inner ear dysfunction   ? Melanoma ([place])   ? Skin cancer removal  ? ? ?No past surgical history on file. ? ?Current Medications: ?Current Meds  ?Medication Sig  ? brimonidine (ALPHAGAN) 0.2 % ophthalmic solution Place 1 drop into both eyes 3 (three) times daily.  ? CALCIUM-VITAMIN D PO Take 2 tablets by mouth daily.  ? Cyanocobalamin (VITAMIN B 12 PO) Take 1 tablet by mouth daily.  ? dorzolamide-timolol (COSOPT) 22.3-6.8 MG/ML ophthalmic solution Place 1 drop into both eyes 2 (two) times daily.   ? latanoprost (XALATAN) 0.005 % ophthalmic solution Place 1 drop into both eyes at bedtime.  ? [DISCONTINUED] metoprolol tartrate (LOPRESSOR) 25 MG tablet TAKE 1/2 TABLET BY MOUTH 2 TIMES A DAY.  ?  ? ?Allergies:   Patient has no known allergies.  ? ?Social History  ? ?Socioeconomic History  ? Marital status: Married  ?  Spouse name: Not on file  ? Number of children: 2  ? Years of education: 6th   ? Highest education level: Not on file  ?Occupational History  ? Occupation: Publishing copy  ?Tobacco Use  ? Smoking status: Never  ? Smokeless tobacco: Never  ?Substance and Sexual Activity  ? Alcohol use: No  ?  Alcohol/week: 0.0 standard drinks  ? Drug use: No  ? Sexual activity: Not on file  ?  Other Topics Concern  ? Not on file  ?Social History Narrative  ? Not on file  ? ?Social Determinants of Health  ? ?Financial Resource Strain: Not on file  ?Food Insecurity: Not on file  ?Transportation Needs: Not on file  ?Physical Activity: Not on file  ?Stress: Not on file  ?Social Connections: Not on file  ?  ? ?Family History: ?The patient's family history includes Colon cancer in her mother; Heart disease in her brother and father; Other in her sister. ? ?ROS:   ?Please see the history of  present illness.    ? ?EKGs/Labs/Other Studies Reviewed:   ? ?The following studies were reviewed today: ? ?Echocardiogram 02/17/2021: ?Impressions: ? 1. Left ventricular ejection fraction, by estimation, is 60 to 65%. The  ?left ventricle has normal function. The left ventricle has no regional  ?wall motion abnormalities. Left ventricular diastolic function could not  ?be evaluated.  ? 2. Right ventricular systolic function is normal. The right ventricular  ?size is normal. There is moderately elevated pulmonary artery systolic  ?pressure.  ? 3. Left atrial size was severely dilated.  ? 4. Right atrial size was severely dilated.  ? 5. The mitral valve is normal in structure. Moderate mitral valve  ?regurgitation. No evidence of mitral stenosis.  ? 6. Tricuspid valve regurgitation is moderate to severe.  ? 7. The aortic valve is tricuspid. There is mild calcification of the  ?aortic valve. Aortic valve regurgitation is moderate. Mild aortic valve  ?sclerosis is present, with no evidence of aortic valve stenosis.  ? 8. The inferior vena cava is dilated in size with >50% respiratory  ?variability, suggesting right atrial pressure of 8 mmHg.  ? ?Comparison(s): Prior images unable to be directly viewed, comparison made by report only. Multiple valve problems have worsened since 2016 and there is a marked increase in the severity of pulmonary hypertension.  ? ?EKG:  EKG ordered today. EKG personally reviewed and demonstrates atrial fibrillation, rate 65bpm, with RBBB with abnormal T waves in V3-V5. Normal axis. QTc 430 ms. ? ?Recent Labs: ?02/16/2021: TSH 1.346 ?02/18/2021: Magnesium 2.2 ?02/24/2021: ALT 31; BUN <5; Creatinine, Ser 0.66; Hemoglobin 11.5; Platelets 365; Potassium 4.0; Sodium 132  ?Recent Lipid Panel ?   ?Component Value Date/Time  ? CHOL 165 02/07/2015 0454  ? TRIG 81 02/07/2015 0454  ? HDL 33 (L) 02/07/2015 0454  ? CHOLHDL 5.0 02/07/2015 0454  ? VLDL 16 02/07/2015 0454  ? [place] 116 (H) 02/07/2015 0454   ? ? ?Physical Exam:   ? ?Vital Signs: BP (!) 92/42   Pulse 65   Ht '5\' 3"'$  (1.6 m)   Wt 116 lb 6.4 oz (52.8 kg)   SpO2 96%   BMI 20.62 kg/m?    ? ?Wt Readings from Last 3 Encounters:  ?01/06/22 116 lb 6.4 oz (52.8 kg)  ?02/24/21 139 lb 12.4 oz (63.4 kg)  ?12/08/20 115 lb 12.8 oz (52.5 kg)  ?  ? ?General: 88 y.o. thin Caucasian female in no acute distress. ?HEENT: Normocephalic and atraumatic. Sclera clear.  ?Neck: Supple. No JVD. ?Heart: Irregularly irregular rhythm with normal rates. Distinct S1 and S2. No murmurs, gallops, or rubs. Radial pulses 2+ and equal bilaterally. ?Lungs: No increased work of breathing. Clear to ausculation bilaterally. No wheezes, rhonchi, or rales.  ?Abdomen: Soft, non-distended, and non-tender to palpation.  ?Extremities: Minimal lower extremity edema bilaterally.   ?Skin: Warm and dry. ?Neuro: Alert and oriented x3. No focal deficits. ?Psych: Normal affect. Responds appropriately. ? ?Assessment:   ? ?  1. Permanent atrial fibrillation ([place])   ?2. RBBB   ?3. Primary hypertension   ?4. Mitral valve insufficiency, unspecified etiology   ?5. Aortic valve insufficiency, etiology of cardiac valve disease unspecified   ?6. Tricuspid valve insufficiency, unspecified etiology   ? ? ?Plan:   ? ?Permanent Atrial Fibrillation ?RBBB ?Rate controlled. ?- Currently on Lopressor 12.'5mg'$  twice daily. Will stop this given soft BP. ?- Patient stopped Eliquis after having some bloody stools following admission for diverticulitis last summer. Bleeding resolved after Eliquis was stopped. CHA2DS2-VASc = 4 (coronary/aortic calcifications noted on CT, HTN, age x2, female). Discussed reasoning for Eliquis in helping prevent a stroke. Patient understands but does not want to restart any blood thinners. ? ?Hypertension ?History of hypertension but BP soft in the office in the 90s/40s. She  Patient asymptomatic with this  ?- Patient only on Lopressor 12.'5mg'$  twice daily. Will stop this given patient's advanced age  and want to avoid symptomatic hypotension.  ?- Advised patient to monitor BP and HR at home and let us know if BP is >130/80 or HR is > 100. ? ?Moderate Mitral Regurgitation ?Moderate Aortic Insufficiency ?Moderate to

## 2022-01-06 ENCOUNTER — Encounter: Payer: Self-pay | Admitting: Student

## 2022-01-06 ENCOUNTER — Ambulatory Visit (INDEPENDENT_AMBULATORY_CARE_PROVIDER_SITE_OTHER): Payer: Medicare Other | Admitting: Student

## 2022-01-06 VITALS — BP 92/42 | HR 65 | Ht 63.0 in | Wt 116.4 lb

## 2022-01-06 DIAGNOSIS — I351 Nonrheumatic aortic (valve) insufficiency: Secondary | ICD-10-CM

## 2022-01-06 DIAGNOSIS — I10 Essential (primary) hypertension: Secondary | ICD-10-CM | POA: Diagnosis not present

## 2022-01-06 DIAGNOSIS — I4510 Unspecified right bundle-branch block: Secondary | ICD-10-CM | POA: Diagnosis not present

## 2022-01-06 DIAGNOSIS — I340 Nonrheumatic mitral (valve) insufficiency: Secondary | ICD-10-CM | POA: Diagnosis not present

## 2022-01-06 DIAGNOSIS — I071 Rheumatic tricuspid insufficiency: Secondary | ICD-10-CM

## 2022-01-06 DIAGNOSIS — I4821 Permanent atrial fibrillation: Secondary | ICD-10-CM

## 2022-01-06 NOTE — Patient Instructions (Signed)
Medication Instructions:  ?Stop Metoprolol. ?Stop Eliquis. ?*If you need a refill on your cardiac medications before your next appointment, please call your pharmacy* ? ? ?Lab Work: ?None  ?If you have labs (blood work) drawn today and your tests are completely normal, you will receive your results only by: ?MyChart Message (if you have MyChart) OR ?A paper copy in the mail ?If you have any lab test that is abnormal or we need to change your treatment, we will call you to review the results. ? ? ?Testing/Procedures: ?No Testing ? ? ?Follow-Up: ?At [facility], you and your health needs are our priority.  As part of our continuing mission to provide you with exceptional heart care, we have created designated Provider Care Teams.  These Care Teams include your primary Cardiologist (physician) and Advanced Practice Providers (APPs -  Physician Assistants and Nurse Practitioners) who all work together to provide you with the care you need, when you need it. ? ?We recommend signing up for the patient portal called "MyChart".  Sign up information is provided on this After Visit Summary.  MyChart is used to connect with patients for Virtual Visits (Telemedicine).  Patients are able to view lab/test results, encounter notes, upcoming appointments, etc.  Non-urgent messages can be sent to your provider as well.   ?To learn more about what you can do with MyChart, go to [email or web address].   ? ?Your next appointment:   ?6 month(s) ? ?The format for your next appointment:   ?In Person ? ?Provider:   ?Pixie Casino, MD   ? ? ?Other Instructions ?Monitor Blood Pressure and Heart Rate. If Blood Pressure over 130/80 and Heart Rate over 100 BPM Call Office. ? ?Important Information About Sugar ? ? ? ? ?  ?

## 2022-02-14 ENCOUNTER — Other Ambulatory Visit: Payer: Medicare Other

## 2022-02-14 DIAGNOSIS — Z515 Encounter for palliative care: Secondary | ICD-10-CM

## 2022-02-14 NOTE — Progress Notes (Addendum)
COMMUNITY PALLIATIVE CARE SW NOTE  PATIENT NAMEKellyn Williams DOB: [birth year] MRN: [id number]  PRIMARY CARE PROVIDER: Leonard Downing, MD  RESPONSIBLE PARTY:  Acct ID - Guarantor Home Phone Work Phone Relationship Acct Type  [id number] - Kearley,DO* [phone]  Self P/F     [address], [place], [place] [address]    Palliative care SW outreached patient to complete telephonic visit.    Palliative care SW outreached patient to complete telephonic visit. Patient provided update on medical condition and/or changes.    Patient endorses that she is doing "really really well". Patient denies having any pain.  O falls reported. No issues with breathing or chest pains reported. Continues to be independent with ADL's and ambulate w/o AD. Patient continues to drive.   Hospitalizations: none  Appetite: Patients appetite remains fair-poor. Continues to drink ensure plus BID a day along with small meals and snack. Current weight 114lbs.  No significant weight changes noted but is eating less.  No medication needs or adjustments noted. Patient is compliant with medications.   Psychosocial assessment: Patient recently lost her son n 01-29-2022, offered spiritual and grief support, patient declined stating that her church and family are very supportive. No further needs identified. No S/S of depression or anxiety noted, however SW advised patient that s/s of depression is common and normal during stages of grief.   Patient declined in person PC visit at this time. Patient is open to [facility] touching via telephone again in 6-8 weeks.     Palliative care will continue to monitor and assist with long term care planning as needed.   SOCIAL HX:  Social History   Tobacco Use   Smoking status: Never   Smokeless tobacco: Never  Substance Use Topics   Alcohol use: No    Alcohol/week: 0.0 standard drinks    CODE STATUS: DNR   Time spent: 20 min      Daisy Williams, [place]

## 2022-06-30 ENCOUNTER — Other Ambulatory Visit: Payer: Self-pay | Admitting: Nurse Practitioner

## 2022-06-30 ENCOUNTER — Ambulatory Visit
Admission: RE | Admit: 2022-06-30 | Discharge: 2022-06-30 | Disposition: A | Discharge status: Home / Self Care | Payer: Medicare Other | Source: Ambulatory Visit | Attending: Nurse Practitioner | Admitting: Nurse Practitioner

## 2022-06-30 DIAGNOSIS — R0602 Shortness of breath: Secondary | ICD-10-CM

## 2022-07-27 DEATH — deceased

## 2022-09-04 IMAGING — CT CT CTA ABD/PEL W/CM AND/OR W/O CM
2 of 9 series · 10 of 46 positions shown, 16 images · IV contrast (omnipaque)
Comparison: None.

CLINICAL DATA: Mesenteric ischemia

Acute atrial fibrillation
Elevated lactic acid
Abdominal pain
EXAM:
CTA ABDOMEN AND PELVIS WITHOUT AND WITH CONTRAST
TECHNIQUE: Multidetector CT imaging of the abdomen and pelvis was performed
using the standard protocol during bolus administration of
intravenous contrast. Multiplanar reconstructed images and MIPs were
obtained and reviewed to evaluate the vascular anatomy.
CONTRAST:  75mL OMNIPAQUE IOHEXOL 350 MG/ML SOLN

[Series 7: renal cta cor · coronal · 0.68mm/px · 2 of 106 slices shown, 3 images]
[im 36/106  soft-tissue]
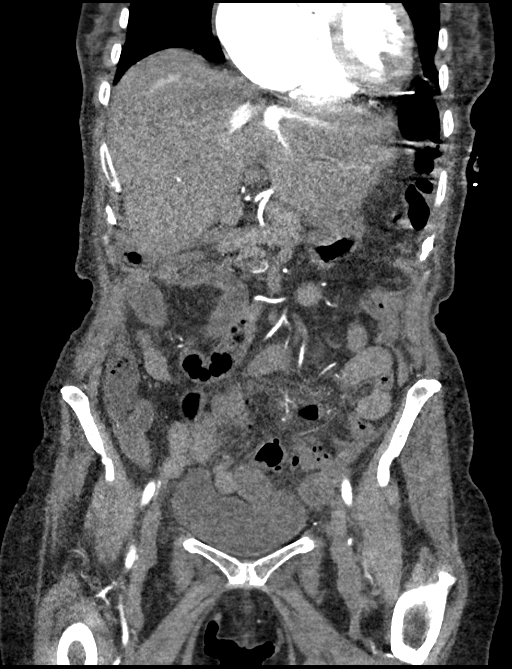
[im 36/106  bone]
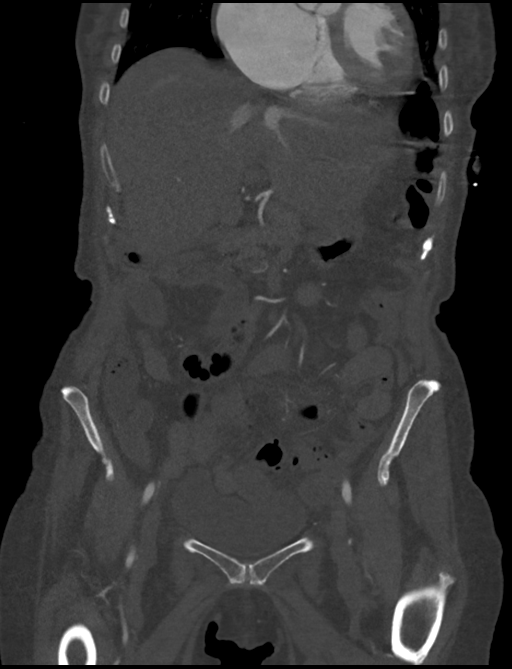
[im 71/106  soft-tissue]
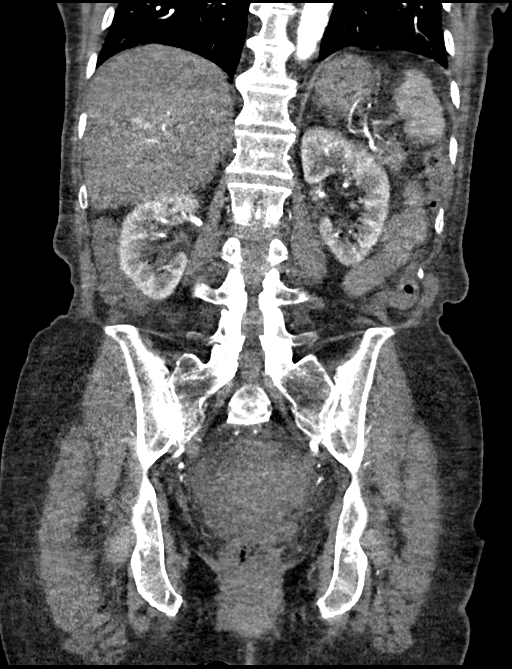

[Series 11: venous 5.0 · axial · portal-venous · 0.84mm/px · z∈[+534,+884]mm · 8 of 90 slices shown, 13 images]
[im 10/90  soft-tissue]
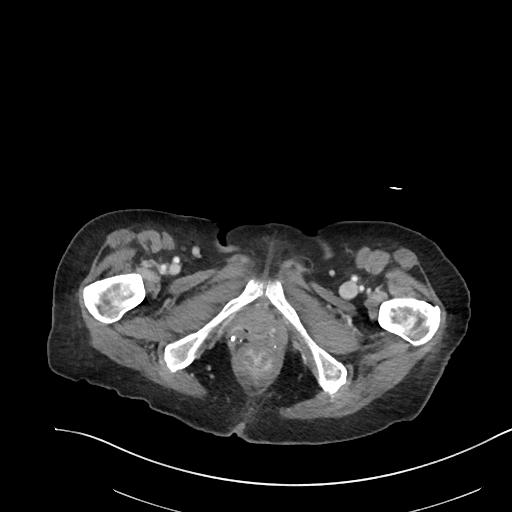
[im 10/90  bone]
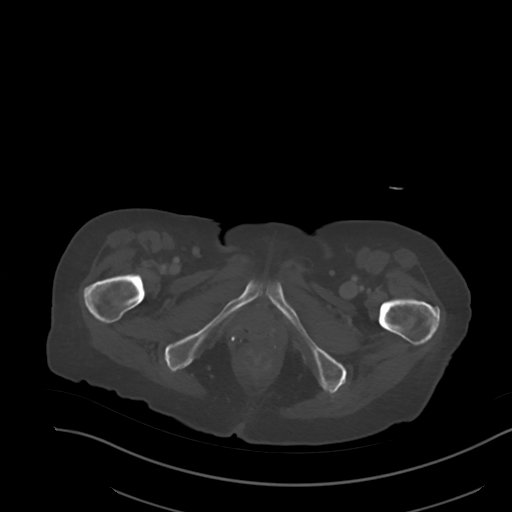
[im 20/90  soft-tissue]
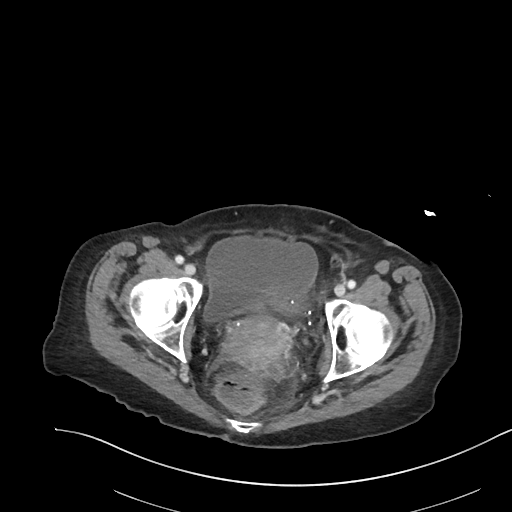
[im 30/90  soft-tissue]
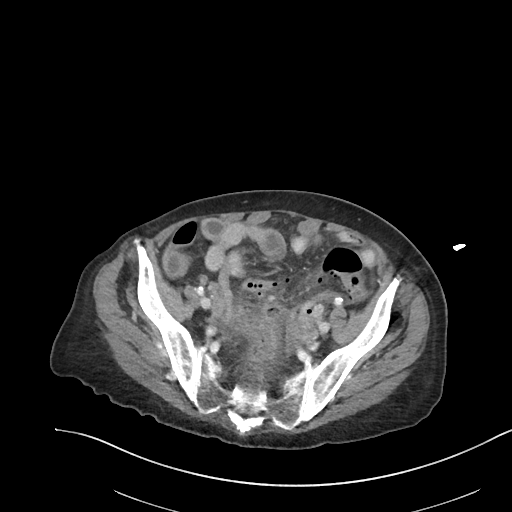
[im 40/90  soft-tissue]
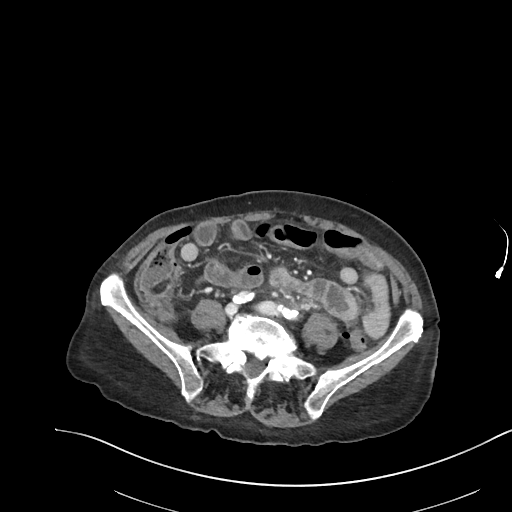
[im 50/90  soft-tissue]
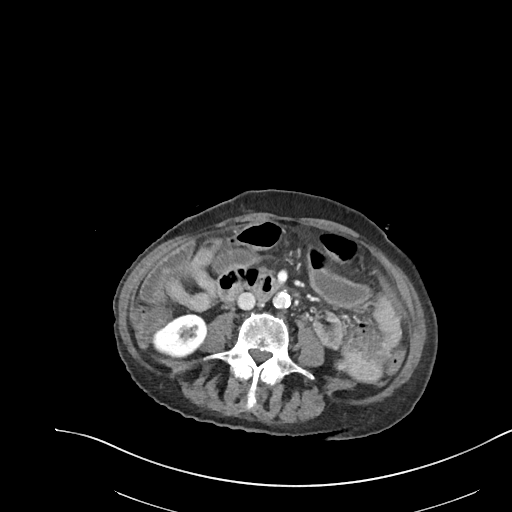
[im 50/90  lung]
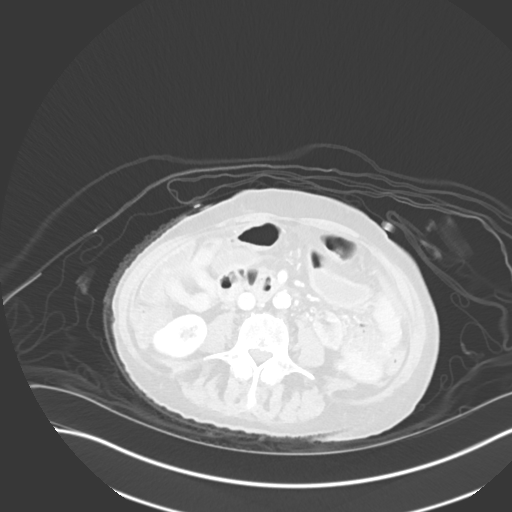
[im 60/90  soft-tissue]
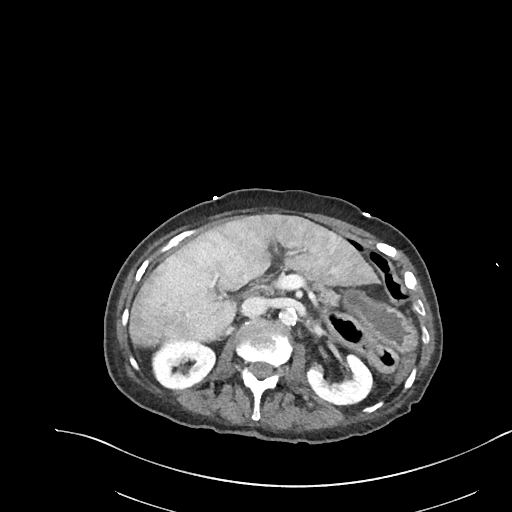
[im 60/90  lung]
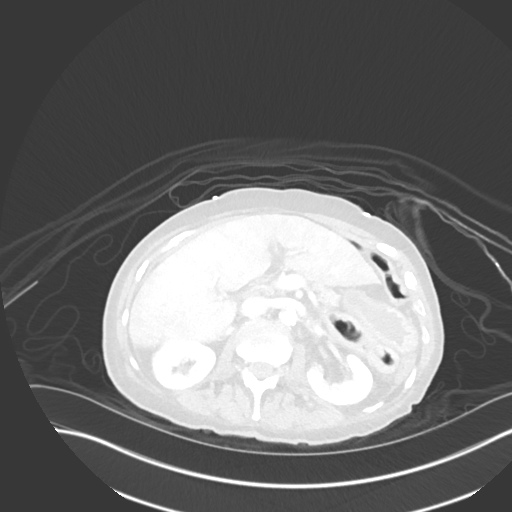
[im 70/90  soft-tissue]
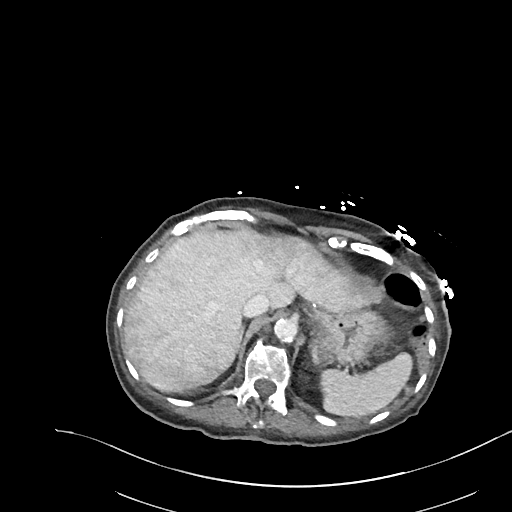
[im 70/90  lung]
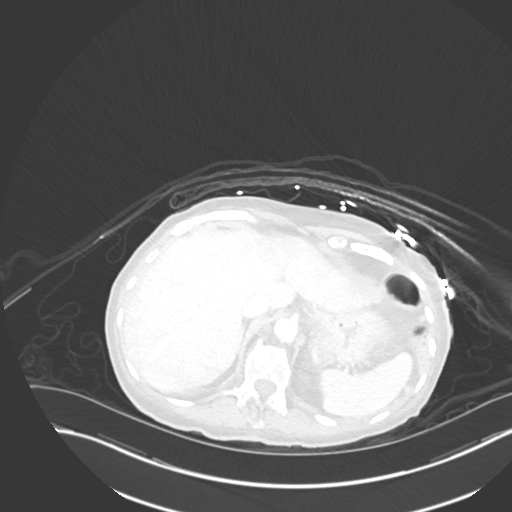
[im 80/90  soft-tissue]
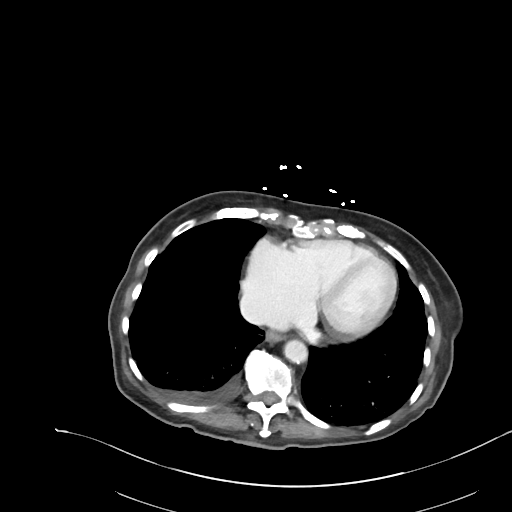
[im 80/90  lung]
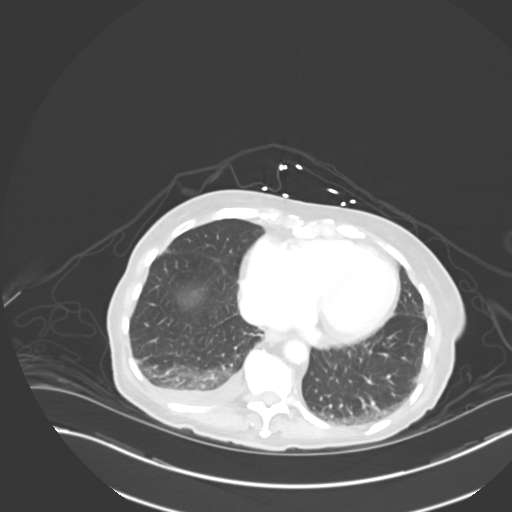

[10 of 46 positions shown; findings below may reference images not displayed]

FINDINGS: VASCULAR

Aorta: Diffuse atherosclerotic changes of the abdominal aorta with
mild ectasia of the infrarenal segment measuring up to 1.4 cm.

Celiac: No significant stenosis of the celiac artery.

SMA: No significant stenosis. The right hepatic artery is replaced,
originating from the SMA.

Renals: No significant stenosis.

IMA: Patent.

Inflow: Mild diffuse narrowing of the common iliac arteries due to
diffusely calcified plaque. Bilateral internal iliac arteries are
patent. No significant narrowing of the external iliac arteries.

Proximal Outflow: Bilateral common femoral and visualized portions
of the superficial and profunda femoral arteries are patent without
evidence of aneurysm, dissection, vasculitis or significant
stenosis.

Veins: Hepatic, portal, splenic, superior mesenteric veins are
patent.

Review of the MIP images confirms the above findings.

NON-VASCULAR

Lower chest: Trace right pleural effusion.  Bibasilar atelectasis.

Hepatobiliary: Diffuse heterogeneous enhancement of the liver
without discrete mass or biliary dilatation. Is gallbladder is
collapsed.

Pancreas: Unremarkable. No pancreatic ductal dilatation or
surrounding inflammatory changes.

Spleen: Normal in size without focal abnormality.

Adrenals/Urinary Tract: Adrenal glands are normal. Subcentimeter
renal hypodensities are too small to fully characterize, however
statistically most likely represent small simple cysts. No
hydronephrosis or hydroureter. Bladder is normal in appearance.

Stomach/Bowel: No bowel dilatation to indicate ileus or obstruction.
Appendix is normal.

There is diverticulosis of the descending and sigmoid colon. There
is acute inflammation of the sigmoid colon likely due to
diverticulitis. Multiple foci of extraluminal air is noted. There is
extensive stranding of the Peri sigmoid colon fat.

There are multiple collections of fluid/air with the largest located
in the left lateral pelvis measuring 5.9 x 1.9 x 2.2 cm which is
suspicious for abscess. Multiple additional smaller foci of air and
fluid also noted within the pelvis.

Lymphatic: No enlarged lymph nodes.

Reproductive: Evaluation of the pelvic organs is difficult as the
uterus and ovaries are not discernible independent of the
inflammatory changes.

Other: Small ovoid fluid collection noted in the left anterior
pelvis subcutaneous tissues measuring 2.2 x 2.1 x 1.3 cm. This may
be an additional small abscess.

Musculoskeletal: No acute or significant osseous findings.
IMPRESSION: VASCULAR

No significant stenosis of the mesenteric arteries or veins.

NON-VASCULAR

1. Marked inflammatory changes of the pelvis, likely due to
perforated sigmoid colon diverticulitis. Multiple small collections
of fluid and air suspicious for abscesses. The largest is located in
the left lateral pelvis measuring 5.9 x 1.9 x 2.2 cm. Small amounts
of extraluminal air noted within the pelvis indicative of
perforation.
2. Diffuse heterogeneous enhancement of the liver without discrete
mass. Findings are nonspecific, however most likely due to vascular
congestion or hepatitis.

These results were called by telephone at the time of interpretation
on 02/15/2021 at [DATE] to provider GUTO ARJONES , who verbally
acknowledged these results.
